# Patient Record
Sex: Male | Born: 1955 | Race: White | Hispanic: No | Marital: Married | Smoking: Former smoker
Health system: Southern US, Community
[De-identification: ages and names within clinical notes are randomized; demographics above are authoritative.]

## PROBLEM LIST (undated history)

## (undated) DIAGNOSIS — J42 Unspecified chronic bronchitis: Secondary | ICD-10-CM

## (undated) DIAGNOSIS — J189 Pneumonia, unspecified organism: Secondary | ICD-10-CM

## (undated) DIAGNOSIS — I2510 Atherosclerotic heart disease of native coronary artery without angina pectoris: Secondary | ICD-10-CM

## (undated) DIAGNOSIS — I209 Angina pectoris, unspecified: Secondary | ICD-10-CM

## (undated) DIAGNOSIS — E7800 Pure hypercholesterolemia, unspecified: Secondary | ICD-10-CM

## (undated) DIAGNOSIS — T7840XA Allergy, unspecified, initial encounter: Secondary | ICD-10-CM

## (undated) DIAGNOSIS — M545 Low back pain, unspecified: Secondary | ICD-10-CM

## (undated) DIAGNOSIS — M5136 Other intervertebral disc degeneration, lumbar region: Secondary | ICD-10-CM

## (undated) DIAGNOSIS — J45909 Unspecified asthma, uncomplicated: Secondary | ICD-10-CM

## (undated) DIAGNOSIS — M1990 Unspecified osteoarthritis, unspecified site: Secondary | ICD-10-CM

## (undated) DIAGNOSIS — G4730 Sleep apnea, unspecified: Secondary | ICD-10-CM

## (undated) DIAGNOSIS — M84376A Stress fracture, unspecified foot, initial encounter for fracture: Secondary | ICD-10-CM

## (undated) DIAGNOSIS — M810 Age-related osteoporosis without current pathological fracture: Secondary | ICD-10-CM

## (undated) DIAGNOSIS — I219 Acute myocardial infarction, unspecified: Secondary | ICD-10-CM

## (undated) DIAGNOSIS — G8929 Other chronic pain: Secondary | ICD-10-CM

## (undated) DIAGNOSIS — I10 Essential (primary) hypertension: Secondary | ICD-10-CM

## (undated) DIAGNOSIS — G43909 Migraine, unspecified, not intractable, without status migrainosus: Secondary | ICD-10-CM

## (undated) HISTORY — DX: Acute myocardial infarction, unspecified: I21.9

## (undated) HISTORY — DX: Allergy, unspecified, initial encounter: T78.40XA

## (undated) HISTORY — DX: Essential (primary) hypertension: I10

## (undated) HISTORY — PX: BILATERAL CARPAL TUNNEL RELEASE: SHX6508

## (undated) HISTORY — DX: Other intervertebral disc degeneration, lumbar region: M51.36

## (undated) HISTORY — PX: KNEE CARTILAGE SURGERY: SHX688

## (undated) HISTORY — DX: Unspecified asthma, uncomplicated: J45.909

## (undated) HISTORY — DX: Stress fracture, unspecified foot, initial encounter for fracture: M84.376A

## (undated) HISTORY — DX: Migraine, unspecified, not intractable, without status migrainosus: G43.909

## (undated) HISTORY — DX: Age-related osteoporosis without current pathological fracture: M81.0

---

## 1976-01-30 HISTORY — PX: KNEE ARTHROSCOPY: SHX127

## 2006-04-30 DIAGNOSIS — M545 Low back pain, unspecified: Secondary | ICD-10-CM | POA: Insufficient documentation

## 2006-04-30 DIAGNOSIS — M5414 Radiculopathy, thoracic region: Secondary | ICD-10-CM

## 2006-04-30 DIAGNOSIS — M5417 Radiculopathy, lumbosacral region: Secondary | ICD-10-CM

## 2006-04-30 DIAGNOSIS — J45909 Unspecified asthma, uncomplicated: Secondary | ICD-10-CM

## 2006-04-30 DIAGNOSIS — M5126 Other intervertebral disc displacement, lumbar region: Secondary | ICD-10-CM

## 2006-04-30 DIAGNOSIS — M47817 Spondylosis without myelopathy or radiculopathy, lumbosacral region: Secondary | ICD-10-CM

## 2008-08-11 DIAGNOSIS — M5412 Radiculopathy, cervical region: Secondary | ICD-10-CM

## 2008-08-11 DIAGNOSIS — M47812 Spondylosis without myelopathy or radiculopathy, cervical region: Secondary | ICD-10-CM

## 2008-08-11 DIAGNOSIS — M542 Cervicalgia: Secondary | ICD-10-CM | POA: Insufficient documentation

## 2008-08-11 DIAGNOSIS — M5020 Other cervical disc displacement, unspecified cervical region: Secondary | ICD-10-CM

## 2013-12-11 ENCOUNTER — Ambulatory Visit: Payer: Self-pay | Admitting: Family Medicine

## 2013-12-14 ENCOUNTER — Ambulatory Visit (INDEPENDENT_AMBULATORY_CARE_PROVIDER_SITE_OTHER): Payer: BC Managed Care – PPO | Admitting: Family Medicine

## 2013-12-14 ENCOUNTER — Encounter: Payer: Self-pay | Admitting: Family Medicine

## 2013-12-14 VITALS — BP 134/79 | HR 78 | Ht 71.0 in | Wt 191.0 lb

## 2013-12-14 DIAGNOSIS — M5136 Other intervertebral disc degeneration, lumbar region: Secondary | ICD-10-CM

## 2013-12-14 DIAGNOSIS — G43909 Migraine, unspecified, not intractable, without status migrainosus: Secondary | ICD-10-CM

## 2013-12-14 DIAGNOSIS — M47816 Spondylosis without myelopathy or radiculopathy, lumbar region: Secondary | ICD-10-CM | POA: Insufficient documentation

## 2013-12-14 DIAGNOSIS — G43809 Other migraine, not intractable, without status migrainosus: Secondary | ICD-10-CM

## 2013-12-14 DIAGNOSIS — M19071 Primary osteoarthritis, right ankle and foot: Secondary | ICD-10-CM | POA: Insufficient documentation

## 2013-12-14 DIAGNOSIS — J302 Other seasonal allergic rhinitis: Secondary | ICD-10-CM | POA: Diagnosis not present

## 2013-12-14 DIAGNOSIS — M84376A Stress fracture, unspecified foot, initial encounter for fracture: Secondary | ICD-10-CM

## 2013-12-14 DIAGNOSIS — M51369 Other intervertebral disc degeneration, lumbar region without mention of lumbar back pain or lower extremity pain: Secondary | ICD-10-CM

## 2013-12-14 HISTORY — DX: Migraine, unspecified, not intractable, without status migrainosus: G43.909

## 2013-12-14 HISTORY — DX: Stress fracture, unspecified foot, initial encounter for fracture: M84.376A

## 2013-12-14 HISTORY — DX: Other intervertebral disc degeneration, lumbar region: M51.36

## 2013-12-14 HISTORY — DX: Other intervertebral disc degeneration, lumbar region without mention of lumbar back pain or lower extremity pain: M51.369

## 2013-12-14 MED ORDER — SUMATRIPTAN SUCCINATE 100 MG PO TABS: 100.0000 mg | ORAL_TABLET | ORAL | Status: DC | PRN

## 2013-12-14 MED ORDER — GABAPENTIN 300 MG PO CAPS: 300.0000 mg | ORAL_CAPSULE | Freq: Every day | ORAL | Status: DC

## 2013-12-14 MED ORDER — MONTELUKAST SODIUM 10 MG PO TABS: 10.0000 mg | ORAL_TABLET | Freq: Every day | ORAL | Status: DC

## 2013-12-14 NOTE — Progress Notes (Signed)
CC: Joseph Jacobs is a 58 y.o. male is here for Establish Care   Subjective: HPI:  Very pleasant 58 year old here to establish care. Administrator at [employer]  Patient reports a history of lumbar degenerative disc disease at least 3 disks. He had an MRI done 2 years ago and since he did not have any radicular pain surgeons at [facility] did not want to intervene on him surgically. He's tried Celebrex and meloxicam the latter of the 2 most effective. He is currently not taking any else for pain. He describes it as a stiffness, pain, ache in the midline of the lumbar spine which is nonradiating. Worse with lifting heavy objects, first thing in the morning. Nothing else seems to make better or worse.  Complains of chronic right foot pain going on for almost 8 months now. He had a bone scan while at [facility] which was suggestive of a stress fracture he also had a x-ray in May of this year with nonspecific midfoot arthrosis at the second tarsometatarsal joint. Pain is described as a 8 out of 10 occasionally 10 out of 10 with weightbearing.slight improvement with meloxicam. Slight improvement with a carbon fiber orthotic. Pain is mostly localized on the dorsal aspect of the foot just proximal to the base of the second toe and radiates towards the ankle. It is slightly improved also with wearing immobilizer but he admits he does not wear it that often. Symptoms have been worsening since onset. He was planning on getting an MRI but moved before this was obtained. Denies any swelling redness or warmth overlying the site of pain  Requesting refills on Imitrex. He takes this 1-2 times a month for migraines. Symptoms were resolved within minutes provided he takes this soon after the migraine begins. He denies any new motor or sensory disturbances  Requesting refills on simulator for seasonal allergies. He is currently asymptomatic  Review of Systems - General ROS: negative for - chills, fever, night  sweats, weight gain or weight loss Ophthalmic ROS: negative for - decreased vision Psychological ROS: negative for - anxiety or depression ENT ROS: negative for - hearing change, nasal congestion, tinnitus or allergies Hematological and Lymphatic ROS: negative for - bleeding problems, bruising or swollen lymph nodes Breast ROS: negative Respiratory ROS: no cough, shortness of breath, or wheezing Cardiovascular ROS: no chest pain or dyspnea on exertion Gastrointestinal ROS: no abdominal pain, change in bowel habits, or black or bloody stools Genito-Urinary ROS: negative for - genital discharge, genital ulcers, incontinence or abnormal bleeding from genitals Musculoskeletal ROS: negative for - joint pain or muscle painother than that described above Neurological ROS: negative for - headaches or memory loss Dermatological ROS: negative for lumps, mole changes, rash and skin lesion changes  Past Medical History  Diagnosis Date  . Lumbar degenerative disc disease 12/14/2013  . Migraine 12/14/2013  . Stress fracture of foot 12/14/2013    Past Surgical History  Procedure Laterality Date  . Knee surgery    . Bilateral carpal tunnel release     Family History  Problem Relation Age of Onset  . Alcoholism Mother   . Cancer    . Diabetes Mother     father     History   Social History  . Marital Status: Married    Spouse Name: N/A    Number of Children: N/A  . Years of Education: N/A   Occupational History  . Not on file.   Social History Main Topics  . Smoking status: Current  Every Day Smoker -- 0.25 packs/day for 40 years    Types: Cigarettes  . Smokeless tobacco: Not on file  . Alcohol Use: 1.8 oz/week    3 Not specified per week  . Drug Use: No  . Sexual Activity:    Partners: Female   Other Topics Concern  . Not on file   Social History Narrative  . No narrative on file     Objective: BP 134/79 mmHg  Pulse 78  Ht 5\' 11"  (1.803 m)  Wt 191 lb (86.637 kg)  BMI  26.65 kg/m2  General: Alert and Oriented, No Acute Distress HEENT: Pupils equal, round, reactive to light. Conjunctivae clear.  Moist mucous membranes pharynxUnremarkable Lungs: clear uncomfortable work of breathing Cardiac: Regular rate and rhythm.  Extremities: No peripheral edema.  Strong peripheral pulses. Pain is reproduced in the right foot with compression of the toe box. Pain is also reproduced with translocation of the heads of the second when compressing the plantar aspect of the second metatarsal head he has temporary complete numbness of the second and third toe. No pain at the base of the fifth metatarsal nor when palpating the navicular. Full range of motion and strength throughout the right foot. No overlying skin changes or abnormalities at site of discomfort Mental Status: No depression, anxiety, nor agitation. Skin: Warm and dry.  Assessment & Plan: Joseph Jacobs was seen today for establish care.  Diagnoses and associated orders for this visit:  Other migraine without status migrainosus, not intractable - SUMAtriptan (IMITREX) 100 MG tablet; Take 1 tablet (100 mg total) by mouth every 2 (two) hours as needed for migraine or headache. May repeat in 2 hours if headache persists or recurs.  Lumbar degenerative disc disease - gabapentin (NEURONTIN) 300 MG capsule; Take 1 capsule (300 mg total) by mouth at bedtime. For back pain.  Stress fracture of foot, unspecified laterality, initial encounter  Seasonal allergies  Other Orders - montelukast (SINGULAIR) 10 MG tablet; Take 1 tablet (10 mg total) by mouth at bedtime.    Migraine: Controlled on abortive therapy, refills provided Lumbar degenerative disc disease: Start gabapentin, discussed that he should start this at night one dose before going to bed to determine if it's causing any sedation. If not causing any side effects we can get more aggressive with dosing 3 times a day and then titrating up if needed. Stress  fracture: Current working diagnosis is that he has a stress fracture in the right foot at the second tarsal metatarsal joint. Given his exam I'm suspicious that his pain could also be due to a neuroma. I have referred him to Dr. Karie Schwalbe  in sports medicine for a second opinion and further evaluation. For now where ankle immobilizer during all waking hours other than when showering. Seasonal allergies: Continue montelukast  Handicap placard application completed in the presence of the patient  40 minutes spent face-to-face during visit today of which at least 50% was counseling or coordinating care regarding: 1. Other migraine without status migrainosus, not intractable   2. Lumbar degenerative disc disease   3. Stress fracture of foot, unspecified laterality, initial encounter   4. Seasonal allergies      Return for Dr. Karie Schwalbe Sports Medicine Eval (Stress Fx of Foot?).

## 2013-12-18 ENCOUNTER — Ambulatory Visit (INDEPENDENT_AMBULATORY_CARE_PROVIDER_SITE_OTHER): Payer: BC Managed Care – PPO | Admitting: Sports Medicine

## 2013-12-18 ENCOUNTER — Ambulatory Visit (INDEPENDENT_AMBULATORY_CARE_PROVIDER_SITE_OTHER): Payer: BC Managed Care – PPO

## 2013-12-18 ENCOUNTER — Encounter: Payer: Self-pay | Admitting: Sports Medicine

## 2013-12-18 VITALS — BP 131/81 | HR 82 | Ht 71.0 in | Wt 193.0 lb

## 2013-12-18 DIAGNOSIS — M2021 Hallux rigidus, right foot: Secondary | ICD-10-CM

## 2013-12-18 DIAGNOSIS — M25674 Stiffness of right foot, not elsewhere classified: Secondary | ICD-10-CM

## 2013-12-18 DIAGNOSIS — M25571 Pain in right ankle and joints of right foot: Secondary | ICD-10-CM

## 2013-12-18 NOTE — Progress Notes (Signed)
   Subjective:    I'm seeing this patient as a consultation for:  Dr. 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 He comes in with a long history of pain that he localizes at the right first metatarsal-phalangeal joint. [place] and a bone scan. The bone scan was essentially nonspecific but he was told he may have a stress fracture. Subsequently he was seen here, and referred to me for further evaluation and definitive treatment. Pain is localized at the metatarsophalangeal joint relation to the dorsal forefoot, there is no pain over the dorsum of the metatarsal shaft. Pain is moderate, persistent, it stiff in the mornings and he does exhibit significant gelling. Oral NSAIDs have been ineffective.  Past medical history, Surgical history, Family history not pertinant except as noted below, Social history, Allergies, and medications have been entered into the medical record, reviewed, and no changes needed.   Review of Systems: No headache, visual changes, nausea, vomiting, diarrhea, constipation, dizziness, abdominal pain, skin rash, fevers, chills, night sweats, weight loss, swollen lymph nodes, body aches, joint swelling, muscle aches, chest pain, shortness of breath, mood changes, visual or auditory hallucinations.   Objective:   General: Well Developed, well nourished, and in no acute distress.  Neuro/Psych: Alert and oriented x3, extra-ocular muscles intact, able to move all 4 extremities, sensation grossly intact. Skin: Warm and dry, no rashes noted.  Respiratory: Not using accessory muscles, speaking in full sentences, trachea midline.  Cardiovascular: Pulses palpable, no extremity edema. Abdomen: Does not appear distended. Right Foot: No visible erythema or swelling. Range of motion is full in all directions. Strength is 5/5 in all directions. No hallux valgus. No  pes cavus or pes planus. No abnormal callus noted. No pain over the navicular prominence, or base of fifth metatarsal. No tenderness to palpation of the calcaneal insertion of plantar fascia. No pain at the Achilles insertion. No pain over the calcaneal bursa. No pain of the retrocalcaneal bursa. No tenderness to palpation over the tarsals, metatarsals, or phalanges. No hallux rigidus or limitus. Tender to palpation over the first metatarsophalangeal joint, there are palpable osteophytes and effusion. No pain with compression of the metatarsal heads. Neurovascularly intact distally.  Procedure: Real-time Ultrasound Guided Injection of right first metatarsal-phalangeal joint Device: GE Logiq E  Verbal informed consent obtained.  Time-out conducted.  Noted no overlying erythema, induration, or other signs of local infection.  Skin prepped in a sterile fashion.  Local anesthesia: Topical Ethyl chloride.  With sterile technique and under real time ultrasound guidance:  30-gauge needle advanced into joint, 0.5 mL kenalog 40, 0.5 mL lidocaine injected easily. Completed without difficulty  Pain immediately resolved suggesting accurate placement of the medication.  Advised to call if fevers/chills, erythema, induration, drainage, or persistent bleeding.  Images permanently stored and available for review in the ultrasound unit.  Impression: Technically successful ultrasound guided injection.  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  Impression and Recommendations:   This case required medical decision making of moderate complexity.

## 2013-12-18 NOTE — Assessment & Plan Note (Signed)
Injection. Return for custom orthotics with first metatarsal ray posting.

## 2014-01-05 ENCOUNTER — Telehealth: Payer: Self-pay

## 2014-01-05 DIAGNOSIS — G43809 Other migraine, not intractable, without status migrainosus: Secondary | ICD-10-CM

## 2014-01-05 NOTE — Telephone Encounter (Signed)
Received PA for Sumatriptan 100 mg tablets. [facility] will allow 9 tablets in a 30 day period. If he is using more than 9 tablets a month he will need a PA to approve the additional tablets. The requirements for additional tablets is as followed: Patient must have tried and failed or currently taking preventive therapy - Headaches have to be less than 12 headaches a month but more than 4. I called patient to see if he was needing a refill early, no answer, left message.    Quantity coverage form in Dr Hommel's in box.

## 2014-01-07 MED ORDER — RIZATRIPTAN BENZOATE 10 MG PO TABS: 10.0000 mg | ORAL_TABLET | ORAL | Status: DC | PRN

## 2014-01-07 NOTE — Telephone Encounter (Signed)
Patient notified. Keylee Shrestha, CMA 

## 2014-01-07 NOTE — Telephone Encounter (Signed)
Yes, I have just now sent in a Rx for rizatriptan.  Can you also inform him that I am not going to submit a PA for sumatriptan since Anglea tells me that [facility] will cover 9 tablets every 30 days w/out a PA and this would be plenty per his frequency of migraines.  Also, the PA will not be submitted since rizatriptan might work as well or better than sumatriptan.

## 2014-01-07 NOTE — Telephone Encounter (Signed)
Thayer OhmChris called and he is asking if rizatriptan can be called to his pharmacy. He feels that this med may help him better than sumatriptan or in conjunction with. Please advise. Corliss SkainsJamie Kendelle Schweers, CMA

## 2014-01-25 ENCOUNTER — Ambulatory Visit (INDEPENDENT_AMBULATORY_CARE_PROVIDER_SITE_OTHER): Payer: BC Managed Care – PPO | Admitting: Sports Medicine

## 2014-01-25 ENCOUNTER — Encounter: Payer: Self-pay | Admitting: Sports Medicine

## 2014-01-25 ENCOUNTER — Ambulatory Visit (INDEPENDENT_AMBULATORY_CARE_PROVIDER_SITE_OTHER): Payer: BC Managed Care – PPO

## 2014-01-25 VITALS — BP 142/86 | HR 85 | Wt 193.0 lb

## 2014-01-25 DIAGNOSIS — M2021 Hallux rigidus, right foot: Secondary | ICD-10-CM

## 2014-01-25 DIAGNOSIS — M19011 Primary osteoarthritis, right shoulder: Secondary | ICD-10-CM | POA: Insufficient documentation

## 2014-01-25 DIAGNOSIS — M25511 Pain in right shoulder: Secondary | ICD-10-CM | POA: Diagnosis not present

## 2014-01-25 DIAGNOSIS — G43809 Other migraine, not intractable, without status migrainosus: Secondary | ICD-10-CM | POA: Diagnosis not present

## 2014-01-25 DIAGNOSIS — M25674 Stiffness of right foot, not elsewhere classified: Secondary | ICD-10-CM | POA: Diagnosis not present

## 2014-01-25 MED ORDER — DICLOFENAC SODIUM 75 MG PO TBEC: 75.0000 mg | DELAYED_RELEASE_TABLET | Freq: Two times a day (BID) | ORAL | Status: DC

## 2014-01-25 MED ORDER — SUMATRIPTAN SUCCINATE 100 MG PO TABS: 100.0000 mg | ORAL_TABLET | ORAL | Status: DC | PRN

## 2014-01-25 NOTE — Assessment & Plan Note (Signed)
Excellent response to first MTP injection. Completely pain-free at this joint.  he does have some tenderness to palpation over the dorsum of the fourth metatarsal shaft suggestive of a stress injury.  Voltaren.  continue carbon fiber plate.  Return in one month.

## 2014-01-25 NOTE — Progress Notes (Signed)

## 2014-01-25 NOTE — Assessment & Plan Note (Signed)
Pain is referable to the right glenohumeral joint, x-rays, switching to Voltaren. Injection if no better.

## 2014-01-25 NOTE — Assessment & Plan Note (Signed)
Written prescription for sumatriptan.

## 2014-02-26 ENCOUNTER — Ambulatory Visit: Payer: Self-pay | Admitting: Sports Medicine

## 2014-03-02 ENCOUNTER — Ambulatory Visit (INDEPENDENT_AMBULATORY_CARE_PROVIDER_SITE_OTHER): Payer: BC Managed Care – PPO | Admitting: Sports Medicine

## 2014-03-02 ENCOUNTER — Encounter: Payer: Self-pay | Admitting: Sports Medicine

## 2014-03-02 DIAGNOSIS — M25511 Pain in right shoulder: Secondary | ICD-10-CM | POA: Diagnosis not present

## 2014-03-02 NOTE — Assessment & Plan Note (Signed)
Persistent pain at the anterior joint line, glenohumeral joint injection as above, formal physical therapy. Return in one month.

## 2014-03-02 NOTE — Progress Notes (Signed)
  Subjective:    CC: Follow-up  HPI: Foot pain: Doing well with orthotics and post first MTP injection sometime ago.  Right shoulder pain: Moderate, persistent, localized anteriorly at the joint line and worse with abduction and external rotation. He has done some rehabilitation exercises and we switched him to Voltaren at the last visit, this is helped somewhat but he continues to have severe pain with abduction. He is amenable to try interventional treatment today.  Past medical history, Surgical history, Family history not pertinant except as noted below, Social history, Allergies, and medications have been entered into the medical record, reviewed, and no changes needed.   Review of Systems: No fevers, chills, night sweats, weight loss, chest pain, or shortness of breath.   Objective:    General: Well Developed, well nourished, and in no acute distress.  Neuro: Alert and oriented x3, extra-ocular muscles intact, sensation grossly intact.  HEENT: Normocephalic, atraumatic, pupils equal round reactive to light, neck supple, no masses, no lymphadenopathy, thyroid nonpalpable.  Skin: Warm and dry, no rashes. Cardiac: Regular rate and rhythm, no murmurs rubs or gallops, no lower extremity edema.  Respiratory: Clear to auscultation bilaterally. Not using accessory muscles, speaking in full sentences. Right Shoulder: Inspection reveals no abnormalities, atrophy or asymmetry. Palpation is normal with no tenderness over AC joint or bicipital groove. ROM is full in all planes. Rotator cuff strength normal throughout. No signs of impingement with negative Neer and Hawkin's tests, empty can. Speeds and Yergason's tests normal. Positive crank test, with tenderness to palpation at the anterior joint line. Normal scapular function observed. No painful arc and no drop arm sign. No apprehension sign  Procedure: Real-time Ultrasound Guided Injection of right glenohumeral joint Device: GE Logiq E    Verbal informed consent obtained.  Time-out conducted.  Noted no overlying erythema, induration, or other signs of local infection.  Skin prepped in a sterile fashion.  Local anesthesia: Topical Ethyl chloride.  With sterile technique and under real time ultrasound guidance:  Using a spinal needle, and taking care to avoid the labrum I injected 1 mL kenalog 40, 4 mL lidocaine into the glenohumeral joint, this resulted in complete relief of his pain. Completed without difficulty  Pain immediately resolved suggesting accurate placement of the medication.  Advised to call if fevers/chills, erythema, induration, drainage, or persistent bleeding.  Images permanently stored and available for review in the ultrasound unit.  Impression: Technically successful ultrasound guided injection.  Impression and Recommendations:

## 2014-03-10 ENCOUNTER — Ambulatory Visit (INDEPENDENT_AMBULATORY_CARE_PROVIDER_SITE_OTHER): Payer: BC Managed Care – PPO | Admitting: Physical Therapy

## 2014-03-10 DIAGNOSIS — M25519 Pain in unspecified shoulder: Secondary | ICD-10-CM

## 2014-03-10 DIAGNOSIS — M6281 Muscle weakness (generalized): Secondary | ICD-10-CM

## 2014-03-10 DIAGNOSIS — M25511 Pain in right shoulder: Secondary | ICD-10-CM

## 2014-03-18 ENCOUNTER — Encounter: Payer: Self-pay | Admitting: Physical Therapy

## 2014-03-18 ENCOUNTER — Ambulatory Visit (INDEPENDENT_AMBULATORY_CARE_PROVIDER_SITE_OTHER): Payer: BC Managed Care – PPO | Admitting: Physical Therapy

## 2014-03-18 DIAGNOSIS — R29898 Other symptoms and signs involving the musculoskeletal system: Secondary | ICD-10-CM

## 2014-03-18 DIAGNOSIS — M25511 Pain in right shoulder: Secondary | ICD-10-CM

## 2014-03-18 NOTE — Patient Instructions (Addendum)
Strengthening: Resisted Horizontal Abduction   Hold tubing in right hand, elbow straight, arm in, parallel to floor. Pull arm out from side through pain-free range. Repeat _10___ times per set. Do ___3_ sets per session. Do ___1_ sessions per day.  http://orth.exer.us/836   Copyright  VHI. All rights reserved.  Low Row: Single Arm   Face anchor in stride stance. Hold with both handsPalm up, squeeze shoulder blades together. Repeat _10_ times per set. Repeat with other arm. Do _3_ sets per session. Do _1_ sessions per day. Anchor Height: Waist  http://tub.exer.us/71   Copyright  VHI. All rights reserved.

## 2014-03-18 NOTE — Therapy (Signed)
[facility] [address] [place], [place], [address] Phone: [phone]   Fax:  [phone]  Physical Therapy Treatment  Patient Details  Name: Joseph Jacobs MRN: [id number] Date of Birth: 12/24/1955 Referring Provider:  Monica Bectonhekkekandam, Thomas J,*  Encounter Date: 03/18/2014      PT End of Session - 03/18/14 1517    Visit Number 2   Number of Visits 6   Date for PT Re-Evaluation 04/08/14   PT Start Time 1450   PT Stop Time 1546   PT Time Calculation (min) 56 min      Past Medical History  Diagnosis Date  . Lumbar degenerative disc disease 12/14/2013  . Migraine 12/14/2013  . Stress fracture of foot 12/14/2013    Past Surgical History  Procedure Laterality Date  . Knee surgery    . Bilateral carpal tunnel release      There were no vitals taken for this visit.  Visit Diagnosis:  Weakness of right arm  Pain in joint, shoulder region, right      Subjective Assessment - 03/18/14 1453    Symptoms doing better, no pain since his injection, only feels muscles working when he is exercise.    Currently in Pain? No/denies                    [facility] Adult PT Treatment/Exercise - 03/18/14 0001    Exercises   Exercises Shoulder   Shoulder Exercises: Prone   Retraction --  lawn mover 3x10 with 3#   Shoulder Exercises: Sidelying   External Rotation --  3x8 with 3# and towel under arm   Other Sidelying Exercises empty can with 2#, 2x8 in limited ROM   Shoulder Exercises: Standing   Theraband Level (Shoulder Protraction) Level 3 (Green)  3x10   Theraband Level (Shoulder ABduction) Level 3 (Green)  3x10 horizontal abduction with focus on scap retraction   Theraband Level (Shoulder Extension) Level 3 (Green)  3x10   Shoulder Exercises: ROM/Strengthening   UBE (Upper Arm Bike) L2 x 2'/2'   Modalities   Modalities Iontophoresis   Iontophoresis   Type of Iontophoresis Dexamethasone   Location  anterior Rt shoulder   Dose 1.0cc   Time 6 hr patch                PT Education - 03/18/14 1513    Education provided Yes   Education Details HEP   Person(s) Educated Patient   Methods Explanation;Demonstration;Handout   Comprehension Returned demonstration                    Plan - 03/18/14 1532    Clinical Impression Statement Patient tolerating HEP well, pain continues to decrease, he will try golf this weekend if weather allows.  Still has some weakness and fatigues quickly with shoudler exercise   Pt will benefit from skilled therapeutic intervention in order to improve on the following deficits Decreased activity tolerance;Impaired UE functional use;Decreased strength   Rehab Potential Excellent   PT Frequency 1x / week   PT Duration 6 weeks   PT Treatment/Interventions Moist Heat;Therapeutic activities;Patient/family education;Therapeutic exercise;Ultrasound;Manual techniques;Cryotherapy;Electrical Stimulation   PT Next Visit Plan progress HEP   Consulted and Agree with Plan of Care Patient        Problem List Patient Active Problem List   Diagnosis Date Noted  . Right shoulder pain 01/25/2014  . Migraine 12/14/2013  . Lumbar degenerative disc disease 12/14/2013  . 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 Seasonal allergies 12/14/2013    Roderic Scarce, PT 03/18/2014, 3:40 PM  [facility] [address] [place], [place], [address] Phone: [phone]   Fax:  [phone]

## 2014-03-25 ENCOUNTER — Encounter: Payer: Self-pay | Admitting: Physical Therapy

## 2014-03-30 ENCOUNTER — Encounter: Payer: Self-pay | Admitting: Sports Medicine

## 2014-03-30 ENCOUNTER — Ambulatory Visit (INDEPENDENT_AMBULATORY_CARE_PROVIDER_SITE_OTHER): Payer: BC Managed Care – PPO | Admitting: Sports Medicine

## 2014-03-30 VITALS — BP 122/82 | HR 81 | Wt 193.0 lb

## 2014-03-30 DIAGNOSIS — M19011 Primary osteoarthritis, right shoulder: Secondary | ICD-10-CM | POA: Diagnosis not present

## 2014-03-30 NOTE — Progress Notes (Signed)
  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  Past medical history, Surgical history, Family history not pertinant except as noted below, Social history, Allergies, and medications have been entered into the medical record, reviewed, and no changes needed.   Review of Systems: No fevers, chills, night sweats, weight loss, chest pain, or shortness of breath.   Objective:    General: Well Developed, well nourished, and in no acute distress.  Neuro: Alert and oriented x3, extra-ocular muscles intact, sensation grossly intact.  HEENT: Normocephalic, atraumatic, pupils equal round reactive to light, neck supple, no masses, no lymphadenopathy, thyroid nonpalpable.  Skin: Warm and dry, no rashes. Cardiac: Regular rate and rhythm, no murmurs rubs or gallops, no lower extremity edema.  Respiratory: Clear to auscultation bilaterally. Not using accessory muscles, speaking in full sentences.  Impression and Recommendations:

## 2014-03-30 NOTE — Assessment & Plan Note (Signed)
Completely resolved after glenohumeral injection. Next line return as needed.

## 2014-05-26 ENCOUNTER — Other Ambulatory Visit: Payer: Self-pay | Admitting: Sports Medicine

## 2014-09-24 ENCOUNTER — Other Ambulatory Visit: Payer: Self-pay | Admitting: Sports Medicine

## 2014-10-15 ENCOUNTER — Telehealth: Payer: Self-pay | Admitting: *Deleted

## 2014-10-15 MED ORDER — ALBUTEROL SULFATE (2.5 MG/3ML) 0.083% IN NEBU: 2.5000 mg | INHALATION_SOLUTION | Freq: Four times a day (QID) | RESPIRATORY_TRACT | Status: DC | PRN

## 2014-10-15 NOTE — Telephone Encounter (Addendum)
Pt requested albuterol for nebulizer machine. I have to route since we have not rx'ed this medication yet

## 2014-10-15 NOTE — Telephone Encounter (Signed)
Rx sent to [facility] on [address]

## 2014-12-10 ENCOUNTER — Encounter: Payer: Self-pay | Admitting: Family Medicine

## 2014-12-10 ENCOUNTER — Ambulatory Visit (INDEPENDENT_AMBULATORY_CARE_PROVIDER_SITE_OTHER): Payer: BC Managed Care – PPO | Admitting: Family Medicine

## 2014-12-10 VITALS — BP 123/86 | HR 87 | Wt 200.0 lb

## 2014-12-10 DIAGNOSIS — J302 Other seasonal allergic rhinitis: Secondary | ICD-10-CM | POA: Diagnosis not present

## 2014-12-10 DIAGNOSIS — G43809 Other migraine, not intractable, without status migrainosus: Secondary | ICD-10-CM | POA: Diagnosis not present

## 2014-12-10 DIAGNOSIS — R413 Other amnesia: Secondary | ICD-10-CM | POA: Diagnosis not present

## 2014-12-10 MED ORDER — SUMATRIPTAN SUCCINATE 100 MG PO TABS: 100.0000 mg | ORAL_TABLET | ORAL | Status: DC | PRN

## 2014-12-10 MED ORDER — ALBUTEROL SULFATE HFA 108 (90 BASE) MCG/ACT IN AERS: INHALATION_SPRAY | RESPIRATORY_TRACT | Status: DC
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
## 2014-12-11 LAB — TSH: TSH: 1.258 u[IU]/mL (ref 0.350–4.500)

## 2014-12-11 LAB — IRON AND TIBC
%SAT: 33 % (ref 15–60)
IRON: 114 ug/dL (ref 50–180)
TIBC: 346 ug/dL (ref 250–425)
UIBC: 232 ug/dL (ref 125–400)

## 2014-12-11 LAB — VITAMIN B12: Vitamin B-12: 250 pg/mL (ref 211–911)

## 2014-12-11 LAB — VITAMIN D 25 HYDROXY (VIT D DEFICIENCY, FRACTURES): VIT D 25 HYDROXY: 29 ng/mL — AB (ref 30–100)

## 2014-12-13 ENCOUNTER — Telehealth: Payer: Self-pay | Admitting: Family Medicine

## 2014-12-13 ENCOUNTER — Ambulatory Visit (INDEPENDENT_AMBULATORY_CARE_PROVIDER_SITE_OTHER): Payer: BC Managed Care – PPO | Admitting: Sports Medicine

## 2014-12-13 ENCOUNTER — Encounter: Payer: Self-pay | Admitting: Sports Medicine

## 2014-12-13 ENCOUNTER — Encounter: Payer: Self-pay | Admitting: Family Medicine

## 2014-12-13 VITALS — BP 131/70 | HR 91

## 2014-12-13 DIAGNOSIS — M19071 Primary osteoarthritis, right ankle and foot: Secondary | ICD-10-CM | POA: Diagnosis not present

## 2014-12-13 DIAGNOSIS — G4730 Sleep apnea, unspecified: Secondary | ICD-10-CM

## 2014-12-13 DIAGNOSIS — E559 Vitamin D deficiency, unspecified: Secondary | ICD-10-CM

## 2014-12-13 DIAGNOSIS — M51369 Other intervertebral disc degeneration, lumbar region without mention of lumbar back pain or lower extremity pain: Secondary | ICD-10-CM

## 2014-12-13 DIAGNOSIS — R413 Other amnesia: Secondary | ICD-10-CM | POA: Insufficient documentation

## 2014-12-13 DIAGNOSIS — M5136 Other intervertebral disc degeneration, lumbar region: Secondary | ICD-10-CM

## 2014-12-13 MED ORDER — VITAMIN D (ERGOCALCIFEROL) 1.25 MG (50000 UNIT) PO CAPS: 50000.0000 [IU] | ORAL_CAPSULE | ORAL | Status: DC

## 2014-12-13 MED ORDER — TRAMADOL HCL 50 MG PO TABS: ORAL_TABLET | ORAL | Status: DC

## 2014-12-13 NOTE — Telephone Encounter (Signed)
Will you please let patient know that his blood work showed a mild Vitamin D deficiency but no abnormality to account for the severity of his memory loss.  I'd recommend starting a weekly vitamin d supplement to help address his deficiency, Rx sent to [facility]. Also I'd recommend a home sleep test, please call me if not notified about arranging this by next week.

## 2014-12-13 NOTE — Progress Notes (Addendum)
  Subjective:    CC: Back and foot pain  HPI: This is a pleasant 59 year old male, I have treated him for right foot osteoarthritis, we injected his first metatarsophalangeal joint and he had a fantastic response last year, he now has an increase in pain that he localizes over the dorsal midfoot, moderate, persistent, worse in the morning and with gelling.  Back pain: History of lumbar degenerative disc disease with back pain on sitting, flexion, Valsalva without any radicular symptoms, bowel or bladder dysfunction, saddle numbness, or constitutional symptoms. He has had caudal epidurals in the past with 3 to four-month responses. Most recent MRI was 2-3 years ago.  Past medical history, Surgical history, Family history not pertinant except as noted below, Social history, Allergies, and medications have been entered into the medical record, reviewed, and no changes needed.   Review of Systems: No fevers, chills, night sweats, weight loss, chest pain, or shortness of breath.   Objective:    General: Well Developed, well nourished, and in no acute distress.  Neuro: Alert and oriented x3, extra-ocular muscles intact, sensation grossly intact.  HEENT: Normocephalic, atraumatic, pupils equal round reactive to light, neck supple, no masses, no lymphadenopathy, thyroid nonpalpable.  Skin: Warm and dry, no rashes. Cardiac: Regular rate and rhythm, no murmurs rubs or gallops, no lower extremity edema.  Respiratory: Clear to auscultation bilaterally. Not using accessory muscles, speaking in full sentences. Right Foot: No visible erythema or swelling. Range of motion is full in all directions. Strength is 5/5 in all directions. No hallux valgus. No pes cavus or pes planus. No abnormal callus noted. No pain over the navicular prominence, or base of fifth metatarsal. No tenderness to palpation of the calcaneal insertion of plantar fascia. No pain at the Achilles insertion. No pain over the  calcaneal bursa. No pain of the retrocalcaneal bursa. No tenderness to palpation over the tarsals, metatarsals, or phalanges. No hallux rigidus or limitus. Tender to palpation at the naviculocuneiform joint. No pain with compression of the metatarsal heads. Neurovascularly intact distally.  Procedure: Real-time Ultrasound Guided Injection of right naviculocuneiform joint Device: GE Logiq E  Verbal informed consent obtained.  Time-out conducted.  Noted no overlying erythema, induration, or other signs of local infection.  Skin prepped in a sterile fashion.  Local anesthesia: Topical Ethyl chloride.  With sterile technique and under real time ultrasound guidance:  Noted significant overlying degenerative changes on ultrasound, needle advanced into the navicular/lateral cuneiform joint and 0.5 mL kenalog 40, 0.5 mL lidocaine injected easily. Completed without difficulty  Pain immediately resolved suggesting accurate placement of the medication.  Advised to call if fevers/chills, erythema, induration, drainage, or persistent bleeding.  Images permanently stored and available for review in the ultrasound unit.  Impression: Technically successful ultrasound guided injection.  I personally reviewed Joseph Jacobs's MRI, from July 2013, he has severe L2-L3, L4-L5, and L5-S1 degenerative disc disease with bilateral foraminal components, the L2-L3 disc protrusion is very broad based with superior and inferior endplate edema. There is also bilateral L5-S1 facet arthritis and left L3-L4 facet arthritis. Degenerative changes are widespread and lesser at other levels.  Impression and Recommendations:

## 2014-12-13 NOTE — Telephone Encounter (Signed)
Pt advised.

## 2014-12-13 NOTE — Assessment & Plan Note (Signed)
Good response to her previous right first MTP injection a year ago, now having dorsal midfoot pain, does have significant midfoot osteoarthritis and I placed an injection into the naviculocuneiform joint today. Increase diclofenac to twice a day and adding tramadol for breakthrough pain.

## 2014-12-13 NOTE — Assessment & Plan Note (Addendum)
MRI [place] [phone] years old, [place], he will bring the disc for my review and we will set him up for an epidural, pain is axial and discogenic.  MRI results are dictated above, we are going to start with a L4-L5 interlaminar epidural. Return to see me one month after injection.

## 2014-12-14 NOTE — Addendum Note (Signed)
Addended by: Monica BectonHEKKEKANDAM, THOMAS J on: 12/14/2014 12:10 PM   Modules accepted: Orders

## 2014-12-27 ENCOUNTER — Telehealth: Payer: Self-pay

## 2014-12-27 DIAGNOSIS — G478 Other sleep disorders: Secondary | ICD-10-CM

## 2014-12-27 DIAGNOSIS — R413 Other amnesia: Secondary | ICD-10-CM

## 2014-12-27 NOTE — Telephone Encounter (Signed)
In home sleep study was denied but he did get approved for in lab sleep study.

## 2014-12-28 NOTE — Telephone Encounter (Signed)
Order changed.

## 2014-12-29 NOTE — Telephone Encounter (Signed)
Notified. 

## 2015-01-20 ENCOUNTER — Ambulatory Visit
Admission: RE | Admit: 2015-01-20 | Discharge: 2015-01-20 | Disposition: A | Discharge status: Home / Self Care | Payer: BC Managed Care – PPO | Source: Ambulatory Visit | Attending: Sports Medicine | Admitting: Sports Medicine

## 2015-01-20 MED ORDER — IOHEXOL 180 MG/ML  SOLN
1.0000 mL | Freq: Once | INTRAMUSCULAR | Status: AC | PRN
  Administered 2015-01-20: 1 mL via EPIDURAL

## 2015-01-20 MED ORDER — METHYLPREDNISOLONE ACETATE 40 MG/ML INJ SUSP (RADIOLOG
120.0000 mg | Freq: Once | INTRAMUSCULAR | Status: AC
  Administered 2015-01-20: 120 mg via EPIDURAL

## 2015-01-20 NOTE — Discharge Instructions (Signed)

## 2015-01-30 LAB — HM COLONOSCOPY

## 2015-02-07 ENCOUNTER — Other Ambulatory Visit: Payer: Self-pay

## 2015-02-07 DIAGNOSIS — G43809 Other migraine, not intractable, without status migrainosus: Secondary | ICD-10-CM

## 2015-02-07 MED ORDER — SUMATRIPTAN SUCCINATE 100 MG PO TABS: 100.0000 mg | ORAL_TABLET | ORAL | Status: DC | PRN

## 2015-02-14 ENCOUNTER — Other Ambulatory Visit: Payer: Self-pay

## 2015-02-14 DIAGNOSIS — G43809 Other migraine, not intractable, without status migrainosus: Secondary | ICD-10-CM

## 2015-02-14 MED ORDER — SUMATRIPTAN SUCCINATE 100 MG PO TABS: 100.0000 mg | ORAL_TABLET | ORAL | Status: DC | PRN

## 2015-02-23 ENCOUNTER — Other Ambulatory Visit: Payer: Self-pay | Admitting: Sports Medicine

## 2015-02-23 NOTE — Telephone Encounter (Signed)
Routing to PCP

## 2015-02-28 ENCOUNTER — Other Ambulatory Visit: Payer: Self-pay

## 2015-02-28 MED ORDER — SUMATRIPTAN SUCCINATE 100 MG PO TABS: ORAL_TABLET | ORAL | Status: DC

## 2015-03-21 ENCOUNTER — Other Ambulatory Visit: Payer: Self-pay

## 2015-03-21 MED ORDER — MONTELUKAST SODIUM 10 MG PO TABS: 10.0000 mg | ORAL_TABLET | Freq: Every day | ORAL | Status: DC

## 2015-04-11 ENCOUNTER — Ambulatory Visit (INDEPENDENT_AMBULATORY_CARE_PROVIDER_SITE_OTHER): Payer: BC Managed Care – PPO

## 2015-04-11 ENCOUNTER — Ambulatory Visit (INDEPENDENT_AMBULATORY_CARE_PROVIDER_SITE_OTHER): Payer: BC Managed Care – PPO | Admitting: Sports Medicine

## 2015-04-11 VITALS — BP 128/83 | HR 84 | Resp 18 | Wt 205.0 lb

## 2015-04-11 DIAGNOSIS — M19071 Primary osteoarthritis, right ankle and foot: Secondary | ICD-10-CM | POA: Diagnosis not present

## 2015-04-11 DIAGNOSIS — M4806 Spinal stenosis, lumbar region: Secondary | ICD-10-CM

## 2015-04-11 DIAGNOSIS — M51369 Other intervertebral disc degeneration, lumbar region without mention of lumbar back pain or lower extremity pain: Secondary | ICD-10-CM

## 2015-04-11 DIAGNOSIS — M47896 Other spondylosis, lumbar region: Secondary | ICD-10-CM

## 2015-04-11 DIAGNOSIS — M25871 Other specified joint disorders, right ankle and foot: Secondary | ICD-10-CM

## 2015-04-11 DIAGNOSIS — M5136 Other intervertebral disc degeneration, lumbar region: Secondary | ICD-10-CM | POA: Diagnosis not present

## 2015-04-11 DIAGNOSIS — M84376A Stress fracture, unspecified foot, initial encounter for fracture: Secondary | ICD-10-CM | POA: Insufficient documentation

## 2015-04-11 DIAGNOSIS — M84374A Stress fracture, right foot, initial encounter for fracture: Secondary | ICD-10-CM | POA: Diagnosis not present

## 2015-04-11 NOTE — Progress Notes (Signed)
  Subjective:    CC: Multiple issues  HPI: Right great toe pain: Known osteoarthritis, previous injection in this location was over 6 months ago. Desires repeat injection.  Midfoot pain: Localized over the first, second, third metatarsal shafts, has been wearing a boot for several months now. Previous x-rays have been negative. Pain is persistent.  Lumbar radiculopathy: Good response to a left L4-L5 intralaminar epidural, he does desire a repeat epidural, the previous one was 4 months ago, he is agreeable to try a transforaminal approach this time.  Past medical history, Surgical history, Family history not pertinant except as noted below, Social history, Allergies, and medications have been entered into the medical record, reviewed, and no changes needed.   Review of Systems: No fevers, chills, night sweats, weight loss, chest pain, or shortness of breath.   Objective:    General: Well Developed, well nourished, and in no acute distress.  Neuro: Alert and oriented x3, extra-ocular muscles intact, sensation grossly intact.  HEENT: Normocephalic, atraumatic, pupils equal round reactive to light, neck supple, no masses, no lymphadenopathy, thyroid nonpalpable.  Skin: Warm and dry, no rashes. Cardiac: Regular rate and rhythm, no murmurs rubs or gallops, no lower extremity edema.  Respiratory: Clear to auscultation bilaterally. Not using accessory muscles, speaking in full sentences. Right Foot: No visible erythema or swelling. Range of motion is full in all directions. Strength is 5/5 in all directions. No hallux valgus. No pes cavus or pes planus. No abnormal callus noted. No pain over the navicular prominence, or base of fifth metatarsal. No tenderness to palpation of the calcaneal insertion of plantar fascia. No pain at the Achilles insertion. No pain over the calcaneal bursa. No pain of the retrocalcaneal bursa. Tender to palpation at the first MTP as well as over the first,  second, and third metatarsal shafts No hallux rigidus or limitus. No tenderness palpation over interphalangeal joints. No pain with compression of the metatarsal heads. Neurovascularly intact distally.  Procedure: Real-time Ultrasound Guided Injection of right first MTP Device: GE Logiq E  Verbal informed consent obtained.  Time-out conducted.  Noted no overlying erythema, induration, or other signs of local infection.  Skin prepped in a sterile fashion.  Local anesthesia: Topical Ethyl chloride.  With sterile technique and under real time ultrasound guidance:  30-gauge needle advanced into the MTP and 0.5 mL kenalog 40, 0.5 mL lidocaine injected easily. Completed without difficulty  Pain immediately resolved suggesting accurate placement of the medication.  Advised to call if fevers/chills, erythema, induration, drainage, or persistent bleeding.  Images permanently stored and available for review in the ultrasound unit.  Impression: Technically successful ultrasound guided injection.  Impression and Recommendations:

## 2015-04-11 NOTE — Assessment & Plan Note (Addendum)
Getting a new MRI of the lumbar spine, this will be performed today as well Repeat left-sided L4-L5 epidural, this time transforaminal

## 2015-04-11 NOTE — Assessment & Plan Note (Signed)
Pain for over a year, has been in a boot for 4 months intermittently.  x-rays were negative.  at this point we are going to obtain an MRI of the foot, pain is at the first, second, and third metatarsal shafts

## 2015-04-11 NOTE — Assessment & Plan Note (Signed)
Injection in the first metatarsophalangeal joint.  Previous injection was greater than 6 months ago

## 2015-04-15 ENCOUNTER — Ambulatory Visit (INDEPENDENT_AMBULATORY_CARE_PROVIDER_SITE_OTHER): Payer: BC Managed Care – PPO | Admitting: Sports Medicine

## 2015-04-15 VITALS — BP 124/85 | HR 82 | Resp 18 | Wt 204.1 lb

## 2015-04-15 DIAGNOSIS — M51369 Other intervertebral disc degeneration, lumbar region without mention of lumbar back pain or lower extremity pain: Secondary | ICD-10-CM

## 2015-04-15 DIAGNOSIS — M19071 Primary osteoarthritis, right ankle and foot: Secondary | ICD-10-CM | POA: Diagnosis not present

## 2015-04-15 DIAGNOSIS — M5136 Other intervertebral disc degeneration, lumbar region: Secondary | ICD-10-CM | POA: Diagnosis not present

## 2015-04-15 NOTE — Assessment & Plan Note (Signed)
Awaiting epidural.

## 2015-04-15 NOTE — Assessment & Plan Note (Signed)
MRI confirms multiple locations midfoot and forefoot osteoarthritis without stress injury. Overall doing well after injection, 70% better after 4 days.

## 2015-04-15 NOTE — Progress Notes (Signed)
  Subjective:    CC: follow-up MRI  HPI: Foot osteoarthritis: MRI confirms arthritis at multiple locations in the foot, he is 70% better after his injection 4 days ago.  Lumbar degenerative disc disease: We also obtained a new lumbar spine MRI that shows similar but slightly worse findings, he is still awaiting his lumbar epidural.  Past medical history, Surgical history, Family history not pertinant except as noted below, Social history, Allergies, and medications have been entered into the medical record, reviewed, and no changes needed.   Review of Systems: No fevers, chills, night sweats, weight loss, chest pain, or shortness of breath.   Objective:    General: Well Developed, well nourished, and in no acute distress.  Neuro: Alert and oriented x3, extra-ocular muscles intact, sensation grossly intact.  HEENT: Normocephalic, atraumatic, pupils equal round reactive to light, neck supple, no masses, no lymphadenopathy, thyroid nonpalpable.  Skin: Warm and dry, no rashes. Cardiac: Regular rate and rhythm, no murmurs rubs or gallops, no lower extremity edema.  Respiratory: Clear to auscultation bilaterally. Not using accessory muscles, speaking in full sentences.  Impression and Recommendations:   I spent 25 minutes with this patient, greater than 50% was face-to-face time counseling regarding the above diagnoses, this time was spent reviewing imaging studies explaining anatomy and pathophysiology of osteoarthritis.

## 2015-05-30 ENCOUNTER — Ambulatory Visit (INDEPENDENT_AMBULATORY_CARE_PROVIDER_SITE_OTHER): Payer: BC Managed Care – PPO | Admitting: Sports Medicine

## 2015-05-30 ENCOUNTER — Encounter: Payer: Self-pay | Admitting: Sports Medicine

## 2015-05-30 ENCOUNTER — Ambulatory Visit (INDEPENDENT_AMBULATORY_CARE_PROVIDER_SITE_OTHER): Payer: BC Managed Care – PPO

## 2015-05-30 VITALS — BP 120/76 | HR 77 | Resp 16 | Wt 201.8 lb

## 2015-05-30 DIAGNOSIS — R1030 Lower abdominal pain, unspecified: Secondary | ICD-10-CM | POA: Diagnosis not present

## 2015-05-30 DIAGNOSIS — M1611 Unilateral primary osteoarthritis, right hip: Secondary | ICD-10-CM

## 2015-05-30 NOTE — Assessment & Plan Note (Signed)
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 Severe hip pain was injected today. X-rays, return to see me in one month.

## 2015-05-30 NOTE — Patient Instructions (Signed)
[facility] [phone]

## 2015-05-30 NOTE — Progress Notes (Signed)
   Subjective:    I'm seeing this patient as a consultation for:  Dr. Laren BoomSean Hommel  CC: right hip pain  HPI: For weeks this pleasant 60 year old male has had severe right groin pain, worse with golfing, bearing weight, in flexion of the hip. Symptoms are severe without radiation, with occasional catching. No trauma. Desires interventional treatment today. Past medical history, Surgical history, Family history not pertinant except as noted below, Social history, Allergies, and medications have been entered into the medical record, reviewed, and no changes needed.   Review of Systems: No headache, visual changes, nausea, vomiting, diarrhea, constipation, dizziness, abdominal pain, skin rash, fevers, chills, night sweats, weight loss, swollen lymph nodes, body aches, joint swelling, muscle aches, chest pain, shortness of breath, mood changes, visual or auditory hallucinations.   Objective:   General: Well Developed, well nourished, and in no acute distress.  Neuro/Psych: Alert and oriented x3, extra-ocular muscles intact, able to move all 4 extremities, sensation grossly intact. Skin: Warm and dry, no rashes noted.  Respiratory: Not using accessory muscles, speaking in full sentences, trachea midline.  Cardiovascular: Pulses palpable, no extremity edema. Abdomen: Does not appear distended. Right Hip: ROM IR: 60 Deg, ER: 60 Deg, Flexion: 120 Deg, Extension: 100 Deg, Abduction: 45 Deg, Adduction: 45 Deg, pain with internal rotation Strength IR: 5/5, ER: 5/5, Flexion: 5/5, Extension: 5/5, Abduction: 5/5, Adduction: 5/5 Pelvic alignment unremarkable to inspection and palpation. Standing hip rotation and gait without trendelenburg / unsteadiness. Greater trochanter without tenderness to palpation. No tenderness over piriformis. No SI joint tenderness and normal minimal SI movement. Positive FADIR test.  Procedure: Real-time Ultrasound Guided Injection of right femoroacetabular joint Device:  GE Logiq E  Verbal informed consent obtained.  Time-out conducted.  Noted no overlying erythema, induration, or other signs of local infection.  Skin prepped in a sterile fashion.  Local anesthesia: Topical Ethyl chloride.  With sterile technique and under real time ultrasound guidance:  Spinal needle advanced to the femoral head/neck junction, 1 mL kenalog 40, 2 mL lidocaine, 2 mL Marcaine injected easily. Completed without difficulty  Pain immediately resolved suggesting accurate placement of the medication.  Advised to call if fevers/chills, erythema, induration, drainage, or persistent bleeding.  Images permanently stored and available for review in the ultrasound unit.  Impression: Technically successful ultrasound guided injection.  Impression and Recommendations:   This case required medical decision making of moderate complexity.

## 2015-06-09 ENCOUNTER — Other Ambulatory Visit: Payer: Self-pay

## 2015-06-23 ENCOUNTER — Ambulatory Visit
Admission: RE | Admit: 2015-06-23 | Discharge: 2015-06-23 | Disposition: A | Discharge status: Home / Self Care | Payer: BC Managed Care – PPO | Source: Ambulatory Visit | Attending: Sports Medicine | Admitting: Sports Medicine

## 2015-06-23 DIAGNOSIS — M5136 Other intervertebral disc degeneration, lumbar region: Secondary | ICD-10-CM

## 2015-06-23 MED ORDER — IOPAMIDOL (ISOVUE-M 200) INJECTION 41%
1.0000 mL | Freq: Once | INTRAMUSCULAR | Status: AC
Start: 2015-06-23 — End: 2015-06-23
  Administered 2015-06-23: 1 mL via EPIDURAL

## 2015-06-23 MED ORDER — METHYLPREDNISOLONE ACETATE 40 MG/ML INJ SUSP (RADIOLOG
120.0000 mg | Freq: Once | INTRAMUSCULAR | Status: AC
Start: 2015-06-23 — End: 2015-06-23
  Administered 2015-06-23: 120 mg via EPIDURAL

## 2015-06-23 NOTE — Discharge Instructions (Signed)

## 2015-07-03 ENCOUNTER — Emergency Department
Admission: EM | Admit: 2015-07-03 | Discharge: 2015-07-03 | Disposition: A | Discharge status: Home / Self Care | Payer: BC Managed Care – PPO | Source: Home / Self Care | Attending: Family Medicine | Admitting: Family Medicine

## 2015-07-03 ENCOUNTER — Encounter: Payer: Self-pay | Admitting: Emergency Medicine

## 2015-07-03 ENCOUNTER — Emergency Department (INDEPENDENT_AMBULATORY_CARE_PROVIDER_SITE_OTHER): Payer: BC Managed Care – PPO

## 2015-07-03 DIAGNOSIS — R208 Other disturbances of skin sensation: Secondary | ICD-10-CM

## 2015-07-03 DIAGNOSIS — R0789 Other chest pain: Secondary | ICD-10-CM

## 2015-07-03 DIAGNOSIS — R200 Anesthesia of skin: Secondary | ICD-10-CM

## 2015-07-03 NOTE — ED Provider Notes (Signed)
CSN: [id number]     Arrival date & time 07/03/15  1320 History   First MD Initiated Contact with Patient 07/03/15 1334     Chief Complaint  Patient presents with  . Chest Pain   (Consider location/radiation/quality/duration/timing/severity/associated sxs/prior Treatment) HPI The pt is a 60yo male directed to [facility] by PCP on-call nurse due to c/o intermittent chest tightness for 3 days with associated lightheadedness and Left arm numbness this morning.  Chest tightness described as 10-20 seconds of pressure-like ache around front of his chest, mild to moderate in severity. Associated mild SOB felt "at the end of my breath" but not all the time.  Hx of asthma but states he doesn't feel as if he needs to use his inhaler. Symptoms come and go several times throughout the day, but he notes he went golfing yesterday with no problems.  Today, while at church he developed the chest tightness and Left arm numbness. He believes he may have slept on Left arm incorrectly. Denies neck or back pain. Denies having symptoms at this time. Denies recent illness of cough, congestion or fever. No diaphoresis or nausea. He does report being under a lot of stress recently as he has a high stress job but also just sold his current house and bought a new house within a 4 day time frame. Denies hx of panic or anxiety attacks.  Denies hx of HTN or high cholesterol. His mother had heart issues but not until her 20s.  No other cardiac hx. Denies leg pain or swelling. No hx of blood clots.  Past Medical History  Diagnosis Date  . Lumbar degenerative disc disease 12/14/2013  . Migraine 12/14/2013  . Stress fracture of foot 12/14/2013   Past Surgical History  Procedure Laterality Date  . Knee surgery    . Bilateral carpal tunnel release     Family History  Problem Relation Age of Onset  . Alcoholism Mother   . Cancer    . Diabetes Mother     father    Social History  Substance Use Topics  . Smoking status: Current  Every Day Smoker -- 0.25 packs/day for 40 years    Types: Cigarettes  . Smokeless tobacco: None  . Alcohol Use: 1.8 oz/week    3 Standard drinks or equivalent per week    Review of Systems  Constitutional: Negative for fever and chills.  HENT: Negative for congestion, ear pain, sore throat, trouble swallowing and voice change.   Respiratory: Positive for shortness of breath ( mild). Negative for cough.   Cardiovascular: Positive for chest pain. Negative for palpitations.  Gastrointestinal: Negative for nausea, vomiting, abdominal pain and diarrhea.  Musculoskeletal: Negative for myalgias, back pain, arthralgias, neck pain and neck stiffness.  Skin: Negative for rash.  Neurological: Positive for light-headedness and numbness. Negative for dizziness, syncope, weakness and headaches.    Allergies  E-mycin  Home Medications   Prior to Admission medications   Medication Sig Start Date End Date Taking? Authorizing Provider  albuterol (PROVENTIL HFA;VENTOLIN HFA) 108 (90 BASE) MCG/ACT inhaler Inhale two puffs every 4-6 hours only as needed for shortness of breath or wheezing. 12/10/14 12/10/15  Sean Hommel, DO  albuterol (PROVENTIL) (2.5 MG/3ML) 0.083% nebulizer solution Take 3 mLs (2.5 mg total) by nebulization every 6 (six) hours as needed for wheezing or shortness of breath. 10/15/14   Sean Hommel, DO  montelukast (SINGULAIR) 10 MG tablet Take 1 tablet (10 mg total) by mouth at bedtime. 03/21/15   Laren Boom,  DO  SUMAtriptan (IMITREX) 100 MG tablet TAKE 1 TABLET BY MOUTH WITH FLUIDS ASAP AFTER ONSET OF MIGRAINE HEADACHE; MAY REPEAT AFTER 2 HOURS IF NEEDED, MAX 2 TABS IN 24 HOURS 02/28/15   Laren BoomSean Hommel, DO   Meds Ordered and Administered this Visit  Medications - No data to display  BP 138/86 mmHg  Pulse 85  Temp(Src) 98.3 F (36.8 C) (Oral)  Ht 5\' 11"  (1.803 m)  Wt 196 lb 12 oz (89.245 kg)  BMI 27.45 kg/m2  SpO2 94% No data found.   Physical Exam  Constitutional: He appears  well-developed and well-nourished.  Pt sitting on exam table, smiling. Appears well. NAD  HENT:  Head: Normocephalic and atraumatic.  Right Ear: Tympanic membrane normal.  Left Ear: Tympanic membrane normal.  Nose: Nose normal.  Mouth/Throat: Uvula is midline, oropharynx is clear and moist and mucous membranes are normal.  Eyes: Conjunctivae are normal. No scleral icterus.  Neck: Normal range of motion. Neck supple.  No cervical spinal or muscular tenderness.  Cardiovascular: Normal rate, regular rhythm and normal heart sounds.   Pulses:      Radial pulses are 2+ on the right side, and 2+ on the left side.  Pulmonary/Chest: Effort normal and breath sounds normal. No respiratory distress. He has no wheezes. He has no rales. He exhibits no tenderness.  Abdominal: Soft. He exhibits no distension. There is no tenderness.  Musculoskeletal: Normal range of motion.  No midline spinal tenderness. Full ROM upper and lower extremities with 5/5 strength bilaterally.   Neurological: He is alert.  Normal sensation Left arm  Skin: Skin is warm and dry.  Psychiatric: He has a normal mood and affect. His behavior is normal.  Nursing note and vitals reviewed.   ED Course  Procedures (including critical care time)  Labs Review Labs Reviewed - No data to display  Imaging Review Dg Chest 2 View  07/03/2015  CLINICAL DATA:  Chest tightness for 3 days EXAM: CHEST  2 VIEW COMPARISON:  06/23/2015 FINDINGS: Normal heart size. No pleural effusion or edema. No airspace consolidation. The visualized osseous structures appear intact. IMPRESSION: 1. No acute cardiopulmonary abnormalities. Electronically Signed   By: Signa Kellaylor  Stroud M.D.   On: 07/03/2015 14:23    Date/Time:07/03/15   13:36:38 Ventricular Rate: 84 PR Interval: 146 QRS Duration: 92 QT Interval: 338 QTC Calculation: 380 P-QRS-T: 57/-4/30 Text Interpretation: Sinus Rhythm, WNL  No prior to compare.     MDM   1. Chest tightness    2. Left arm numbness    Pt c/o chest tightness intermittent for 3 days and 1 episode of Left arm numbness today. Filed Vitals:   07/03/15 1344  BP: 138/86  Pulse: 85  Temp: 98.3 F (36.8 C)  O2 Sat 94% on RA. Pt denies SOB at this time.  Pt appears well. NAD. EKG: WNL   Reassured pt. Normal EKG CXR: Normal  Pt has previously scheduled f/u with PCP on Tuesday 6/6 at 9:30AM.  Strongly encouraged to keep appointment and may call tomorrow to see if any cancellations. Otherwise, stable for discharge home.  May need referral to cardiology if symptoms keep recurring for holter monitor or stress test. Discussed symptoms that warrant emergent care in the ED. Patient and wife verbalized understanding and agreement with treatment plan.   Junius FinnerErin O'Malley, PA-C 07/03/15 1600

## 2015-07-03 NOTE — ED Notes (Signed)
Pt c/o chest tightness x 3 days, lightheadness, numbness in left arm this am.

## 2015-07-04 ENCOUNTER — Encounter: Payer: Self-pay | Admitting: Family Medicine

## 2015-07-04 ENCOUNTER — Ambulatory Visit (INDEPENDENT_AMBULATORY_CARE_PROVIDER_SITE_OTHER): Payer: BC Managed Care – PPO | Admitting: Family Medicine

## 2015-07-04 VITALS — BP 122/83 | HR 86 | Wt 196.0 lb

## 2015-07-04 DIAGNOSIS — R0789 Other chest pain: Secondary | ICD-10-CM

## 2015-07-04 LAB — BASIC METABOLIC PANEL
BUN: 19 mg/dL (ref 7–25)
CALCIUM: 9.6 mg/dL (ref 8.6–10.3)
CO2: 27 mmol/L (ref 20–31)
Chloride: 103 mmol/L (ref 98–110)
Creat: 0.91 mg/dL (ref 0.70–1.33)
GLUCOSE: 134 mg/dL — AB (ref 65–99)
POTASSIUM: 4.7 mmol/L (ref 3.5–5.3)
SODIUM: 140 mmol/L (ref 135–146)

## 2015-07-04 LAB — CBC
HCT: 46.4 % (ref 38.5–50.0)
Hemoglobin: 15.4 g/dL (ref 13.2–17.1)
MCH: 31.2 pg (ref 27.0–33.0)
MCHC: 33.2 g/dL (ref 32.0–36.0)
MCV: 94.1 fL (ref 80.0–100.0)
MPV: 10.9 fL (ref 7.5–12.5)
Platelets: 329 10*3/uL (ref 140–400)
RBC: 4.93 MIL/uL (ref 4.20–5.80)
RDW: 13.2 % (ref 11.0–15.0)
WBC: 9.4 10*3/uL (ref 3.8–10.8)

## 2015-07-04 MED ORDER — PREDNISONE 50 MG PO TABS: 50.0000 mg | ORAL_TABLET | Freq: Every day | ORAL | Status: DC

## 2015-07-04 MED ORDER — IPRATROPIUM-ALBUTEROL 0.5-2.5 (3) MG/3ML IN SOLN
3.0000 mL | Freq: Once | RESPIRATORY_TRACT | Status: AC
  Administered 2015-07-04: 3 mL via RESPIRATORY_TRACT

## 2015-07-04 NOTE — Patient Instructions (Addendum)
Thank you for coming in today. Get labs today.  Get CT scan of the chest soon.  You should hear from cardiology soon.  We would like to see you later this week for recheck.  Start taking prednisone today.  Use albuterol as needed.  Call or go to the emergency room if you get worse, have trouble breathing, have chest pains, or palpitations.   Nonspecific Chest Pain  Chest pain can be caused by many different conditions. There is always a chance that your pain could be related to something serious, such as a heart attack or a blood clot in your lungs. Chest pain can also be caused by conditions that are not life-threatening. If you have chest pain, it is very important to follow up with your health care provider. CAUSES  Chest pain can be caused by:  Heartburn.  Pneumonia or bronchitis.  Anxiety or stress.  Inflammation around your heart (pericarditis) or lung (pleuritis or pleurisy).  A blood clot in your lung.  A collapsed lung (pneumothorax). It can develop suddenly on its own (spontaneous pneumothorax) or from trauma to the chest.  Shingles infection (varicella-zoster virus).  Heart attack.  Damage to the bones, muscles, and cartilage that make up your chest wall. This can include:  Bruised bones due to injury.  Strained muscles or cartilage due to frequent or repeated coughing or overwork.  Fracture to one or more ribs.  Sore cartilage due to inflammation (costochondritis). RISK FACTORS  Risk factors for chest pain may include:  Activities that increase your risk for trauma or injury to your chest.  Respiratory infections or conditions that cause frequent coughing.  Medical conditions or overeating that can cause heartburn.  Heart disease or family history of heart disease.  Conditions or health behaviors that increase your risk of developing a blood clot.  Having had chicken pox (varicella zoster). SIGNS AND SYMPTOMS Chest pain can feel like:  Burning or  tingling on the surface of your chest or deep in your chest.  Crushing, pressure, aching, or squeezing pain.  Dull or sharp pain that is worse when you move, cough, or take a deep breath.  Pain that is also felt in your back, neck, shoulder, or arm, or pain that spreads to any of these areas. Your chest pain may come and go, or it may stay constant. DIAGNOSIS Lab tests or other studies may be needed to find the cause of your pain. Your health care provider may have you take a test called an ambulatory ECG (electrocardiogram). An ECG records your heartbeat patterns at the time the test is performed. You may also have other tests, such as:  Transthoracic echocardiogram (TTE). During echocardiography, sound waves are used to create a picture of all of the heart structures and to look at how blood flows through your heart.  Transesophageal echocardiogram (TEE).This is a more advanced imaging test that obtains images from inside your body. It allows your health care provider to see your heart in finer detail.  Cardiac monitoring. This allows your health care provider to monitor your heart rate and rhythm in real time.  Holter monitor. This is a portable device that records your heartbeat and can help to diagnose abnormal heartbeats. It allows your health care provider to track your heart activity for several days, if needed.  Stress tests. These can be done through exercise or by taking medicine that makes your heart beat more quickly.  Blood tests.  Imaging tests. TREATMENT  Your treatment depends  on what is causing your chest pain. Treatment may include:  Medicines. These may include:  Acid blockers for heartburn.  Anti-inflammatory medicine.  Pain medicine for inflammatory conditions.  Antibiotic medicine, if an infection is present.  Medicines to dissolve blood clots.  Medicines to treat coronary artery disease.  Supportive care for conditions that do not require medicines.  This may include:  Resting.  Applying heat or cold packs to injured areas.  Limiting activities until pain decreases. HOME CARE INSTRUCTIONS  If you were prescribed an antibiotic medicine, finish it all even if you start to feel better.  Avoid any activities that bring on chest pain.  Do not use any tobacco products, including cigarettes, chewing tobacco, or electronic cigarettes. If you need help quitting, ask your health care provider.  Do not drink alcohol.  Take medicines only as directed by your health care provider.  Keep all follow-up visits as directed by your health care provider. This is important. This includes any further testing if your chest pain does not go away.  If heartburn is the cause for your chest pain, you may be told to keep your head raised (elevated) while sleeping. This reduces the chance that acid will go from your stomach into your esophagus.  Make lifestyle changes as directed by your health care provider. These may include:  Getting regular exercise. Ask your health care provider to suggest some activities that are safe for you.  Eating a heart-healthy diet. A registered dietitian can help you to learn healthy eating options.  Maintaining a healthy weight.  Managing diabetes, if necessary.  Reducing stress. SEEK MEDICAL CARE IF:  Your chest pain does not go away after treatment.  You have a rash with blisters on your chest.  You have a fever. SEEK IMMEDIATE MEDICAL CARE IF:   Your chest pain is worse.  You have an increasing cough, or you cough up blood.  You have severe abdominal pain.  You have severe weakness.  You faint.  You have chills.  You have sudden, unexplained chest discomfort.  You have sudden, unexplained discomfort in your arms, back, neck, or jaw.  You have shortness of breath at any time.  You suddenly start to sweat, or your skin gets clammy.  You feel nauseous or you vomit.  You suddenly feel  light-headed or dizzy.  Your heart begins to beat quickly, or it feels like it is skipping beats. These symptoms may represent a serious problem that is an emergency. Do not wait to see if the symptoms will go away. Get medical help right away. Call your local emergency services (911 in the [place] Do not drive yourself to the hospital.   This information is not intended to replace advice given to you by your health care provider. Make sure you discuss any questions you have with your health care provider.   Document Released: 10/25/2004 Document Revised: 02/05/2014 Document Reviewed: 08/21/2013 Elsevier Interactive Patient Education [employer]2016 Elsevier Inc.

## 2015-07-04 NOTE — Progress Notes (Signed)
Joseph Jacobs is a 60 y.o. male who presents to [facility] today for chest pain. Patient has had a 4 day history of chest pain and tightness. He was seen in urgent care on June 4 where EKG and chest x-ray were both normal. He has an appointment with his primary care provider tomorrow. Along with the symptoms he did note some left arm numbness as well. He notes the pain is very mild and sharp. The pain is not exertional at all. He does note some mild wheezing and cough. He thinks perhaps this is related to his asthma. He has used his albuterol inhaler and feels that it helps a bit. No vomiting or diarrhea or sweating. No feeling of doom.   Past Medical History  Diagnosis Date  . Lumbar degenerative disc disease 12/14/2013  . Migraine 12/14/2013  . Stress fracture of foot 12/14/2013   Past Surgical History  Procedure Laterality Date  . Knee surgery    . Bilateral carpal tunnel release     Social History  Substance Use Topics  . Smoking status: Current Every Day Smoker -- 0.25 packs/day for 40 years    Types: Cigarettes  . Smokeless tobacco: Not on file  . Alcohol Use: 1.8 oz/week    3 Standard drinks or equivalent per week   family history includes Alcoholism in his mother; Diabetes in his mother.  ROS as above:  Medications: Current Outpatient Prescriptions  Medication Sig Dispense Refill  . albuterol (PROVENTIL HFA;VENTOLIN HFA) 108 (90 BASE) MCG/ACT inhaler Inhale two puffs every 4-6 hours only as needed for shortness of breath or wheezing. 1 Inhaler 1  . albuterol (PROVENTIL) (2.5 MG/3ML) 0.083% nebulizer solution Take 3 mLs (2.5 mg total) by nebulization every 6 (six) hours as needed for wheezing or shortness of breath. 75 mL 12  . montelukast (SINGULAIR) 10 MG tablet Take 1 tablet (10 mg total) by mouth at bedtime. 90 tablet 1  . SUMAtriptan (IMITREX)  100 MG tablet TAKE 1 TABLET BY MOUTH WITH FLUIDS ASAP AFTER ONSET OF MIGRAINE HEADACHE; MAY REPEAT AFTER 2 HOURS IF NEEDED, MAX 2 TABS IN 24 HOURS 18 tablet 10   No current facility-administered medications for this visit.   Allergies  Allergen Reactions  . E-Mycin [Erythromycin] Other (See Comments)    GI upset      Exam:  BP 122/83 mmHg  Pulse 86  Wt 196 lb (88.905 kg)  SpO2 97%  Orthostatic VS for the past 24 hrs:  BP- Lying Pulse- Lying BP- Sitting Pulse- Sitting BP- Standing at 0 minutes Pulse- Standing at 0 minutes  07/04/15 1433 126/80 mmHg 80 135/81 mmHg 85 143/77 mmHg 92      Gen: Well NAD HEENT: EOMI,  MMM Lungs: Normal work of breathing. Coarse breath sounds with prolonged expiratory phase. Heart: RRR no MRG Abd: NABS, Soft. Nondistended, Nontender Exts: Brisk capillary refill, warm and well perfused.   Twelve-lead EKG shows normal sinus rhythm with 1 PVC. No ST segment elevation or depression. Otherwise normal EKG.  Patient was given a 2.5/0.5 mg DuoNeb nebulizer treatment, and felt a bit better  No results found for this or any previous visit (from the past 24 hour(s)). Dg Chest 2 View  07/03/2015  CLINICAL DATA:  Chest tightness for 3 days EXAM: CHEST  2 VIEW COMPARISON:  06/23/2015 FINDINGS: Normal heart size. No pleural effusion or edema. No airspace consolidation. The visualized osseous structures appear intact. IMPRESSION:  1. No acute cardiopulmonary abnormalities. Electronically Signed   By: Signa Kellaylor  Stroud M.D.   On: 07/03/2015 14:23      Assessment and Plan: [age] y.o. male with chest pain and shortness of breath. This is obviously concerning for acute coronary syndrome. However his EKG remains pretty normal. He responded to a DuoNeb nebulizer treatment indicating that his symptoms are probably related to RAD or bronchitis. However cardiac etiology remains a concern.  Plan for  Refer to cardiology.  Obtain CBC, BMP and Troponin I. Obtain CTA chest  tomorrow. Follow up later this week.     We carefully discussed warning signs or symptoms. Please see discharge instructions. Patient expresses understanding.

## 2015-07-05 ENCOUNTER — Ambulatory Visit (INDEPENDENT_AMBULATORY_CARE_PROVIDER_SITE_OTHER): Payer: BC Managed Care – PPO

## 2015-07-05 ENCOUNTER — Ambulatory Visit: Payer: Self-pay | Admitting: Family Medicine

## 2015-07-05 DIAGNOSIS — I2510 Atherosclerotic heart disease of native coronary artery without angina pectoris: Secondary | ICD-10-CM

## 2015-07-05 DIAGNOSIS — R0789 Other chest pain: Secondary | ICD-10-CM

## 2015-07-05 LAB — TROPONIN I

## 2015-07-05 MED ORDER — IOPAMIDOL (ISOVUE-370) INJECTION 76%
100.0000 mL | Freq: Once | INTRAVENOUS | Status: AC | PRN
  Administered 2015-07-05: 100 mL via INTRAVENOUS

## 2015-07-05 NOTE — Progress Notes (Signed)
Quick Note:  CT scan of the chest looked good. No lung issues. I think it is very important to get in with cardiology ASAP. ______

## 2015-07-05 NOTE — Progress Notes (Signed)
Quick Note:  Normal, no changes. ______ 

## 2015-07-06 ENCOUNTER — Ambulatory Visit (INDEPENDENT_AMBULATORY_CARE_PROVIDER_SITE_OTHER): Payer: BC Managed Care – PPO | Admitting: Family Medicine

## 2015-07-06 ENCOUNTER — Encounter: Payer: Self-pay | Admitting: Family Medicine

## 2015-07-06 VITALS — BP 149/84 | HR 93 | Wt 200.0 lb

## 2015-07-06 DIAGNOSIS — I2510 Atherosclerotic heart disease of native coronary artery without angina pectoris: Secondary | ICD-10-CM

## 2015-07-06 MED ORDER — ASPIRIN EC 81 MG PO TBEC: 81.0000 mg | DELAYED_RELEASE_TABLET | Freq: Every day | ORAL | Status: AC

## 2015-07-06 NOTE — Progress Notes (Signed)
CC: Joseph Jacobs Bures is a 60 y.o. male is here for Results   Subjective: HPI:  Patient is wife have come to ask a number of cardiac questions with the recent finding of calcification of his left anterior descending coronary artery. There is concern that there could be some stenosis is causing his chest discomfort. He wants to know what he is getting himself into should he need a stent or new medications to help manage plaque buildup. He would like to know about the anatomy of the heart and why an individual would be short of breath or have chest discomfort if there is stenosis or catheterized blood flow to the heart muscle. He's not really sure if the prednisone is helping his shortness of breath but is no longer having any left upper extremity numbness. He still gets intermittent chest pains that can happen at rest and he is not tried exerting himself since he was seen over the weekend. He denies any new lightheadedness, abdominal pain, back pain, nor any motor or sensory disturbances. No jaw pain.   Review Of Systems Outlined In HPI  Past Medical History  Diagnosis Date  . Lumbar degenerative disc disease 12/14/2013  . Migraine 12/14/2013  . Stress fracture of foot 12/14/2013  . Asthma     Past Surgical History  Procedure Laterality Date  . Knee surgery    . Bilateral carpal tunnel release     Family History  Problem Relation Age of Onset  . Alcoholism Mother   . Cancer    . Diabetes Mother     father     Social History   Social History  . Marital Status: Married    Spouse Name: N/A  . Number of Children: N/A  . Years of Education: N/A   Occupational History  . Not on file.   Social History Main Topics  . Smoking status: Current Every Day Smoker -- 0.25 packs/day for 40 years    Types: Cigarettes  . Smokeless tobacco: Not on file  . Alcohol Use: 1.8 oz/week    3 Standard drinks or equivalent per week  . Drug Use: No  . Sexual Activity:    Partners: Female    Other Topics Concern  . Not on file   Social History Narrative     Objective: BP 149/84 mmHg  Pulse 93  Wt 200 lb (90.719 kg)  Vital signs reviewed. General: Alert and Oriented, No Acute Distress HEENT: Pupils equal, round, reactive to light. Conjunctivae clear.  External ears unremarkable.  Moist mucous membranes. Lungs: Clear and comfortable work of breathing, speaking in full sentences without accessory muscle use. Cardiac: Regular rate and rhythm.  Neuro: CN II-XII grossly intact, gait normal. Extremities: No peripheral edema.  Strong peripheral pulses.  Mental Status: No depression, anxiety, nor agitation. Logical though process. Skin: Warm and dry.  Assessment & Plan: Joseph Jacobs was seen today for results.  Diagnoses and all orders for this visit:  Coronary artery disease involving native coronary artery of native heart without angina pectoris -     Lipid panel -     Ambulatory referral to Cardiology  Other orders -     aspirin EC 81 MG tablet; Take 1 tablet (81 mg total) by mouth daily.   Time was taken to answer all of his and his wife's questions. I strongly encouraged him to start a baby aspirin and to get a cholesterol panel done as soon as possible.  I discussed that by sending him to a  cardiologist I'm not signing him up for a guaranteed stent but I prepared him that he may need a stress test. Again I told him that I'm not guarantee him that he needs a stent but he had questions about what stents do, how they're delivered and why they're helpful.   40 minutes spent face-to-face during visit today of which at least 50% was counseling or coordinating care regarding: 1. Coronary artery disease involving native coronary artery of native heart without angina pectoris      Return in about 4 weeks (around 08/03/2015) for Chest Pain.

## 2015-07-07 LAB — LIPID PANEL
Cholesterol: 200 mg/dL (ref 125–200)
HDL: 60 mg/dL (ref >=40)
LDL CALC: 104 mg/dL (ref <130)
Total CHOL/HDL Ratio: 3.3 Ratio (ref <=5.0)
Triglycerides: 178 mg/dL — ABNORMAL HIGH (ref <150)
VLDL: 36 mg/dL — ABNORMAL HIGH (ref <30)

## 2015-07-08 MED ORDER — ATORVASTATIN CALCIUM 20 MG PO TABS: 20.0000 mg | ORAL_TABLET | Freq: Every day | ORAL | Status: DC

## 2015-07-08 NOTE — Addendum Note (Signed)
Addended by: Deno EtienneBARKLEY, Page Lancon L on: 07/08/2015 09:22 AM   Modules accepted: Orders

## 2015-07-11 ENCOUNTER — Encounter: Payer: Self-pay | Admitting: Physician Assistant

## 2015-07-11 NOTE — Progress Notes (Signed)
Cardiology Office Note    Date:  07/12/2015   ID:  Joseph Jacobs, DOB 1955/11/13, MRN [id number]  PCP:  Laren Boom, DO  Cardiologist:  New   CC: Chest pain  History of Present Illness:  Joseph Jacobs is a 60 y.o. male with a history of tobacco abuse, asthma and migraines who presents to clinic for evaluation of chest pain.   He was seen in the ER on 07/03/15 for chest tightness and left arm pain. He ruled out for MI and was discharged home. Patient subsequently had a CTA on 07/05/15 which showed the incidental finding of calcification in the LAD territories. He was seen by his PCP on 07/06/15 and continued to complain of chest pain. A lipid panel was ordered which showed TG 178 and LDL 104. He was started on atorvastatin 20mg  daily and was referred to cardiology for further evaluation and treatment.   Today he presents to clinic. He has noticed SOB x2 weeks while sitting at rest and even while talking. He also noted a dull pain in the left chest with some numbness in his left arm.  His heart feels like a dull, deep pain. This would feel better with getting up and walking around. Very aware of his heart beating and gets palpitations often.   He is playing golf a couple times a week but activity is limited by bulging discs, a torn hip labrum and separated metatarsal. He was mowing the lawn up until recently and did notice some SOB and chest tightness with exertion. No LE edema, orthopnea, PND. No dizziness or syncope. Some times he gets lightheaded when he fells SOB. No blood in stool or urine. No bleeding issues or history of CVA.  His mom had stents in her 80s. He thinks his brother may have heart problems. He is a smoker. He does smoke 3-5 cigarettes a day x 30 years. He drinks an occasional glass of wine at night. 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     Past Medical History  Diagnosis Date  . Lumbar degenerative disc disease 12/14/2013  .  Migraine 12/14/2013  . Stress fracture of foot 12/14/2013  . Asthma     Past Surgical History  Procedure Laterality Date  . Knee surgery    . Bilateral carpal tunnel release      Current Medications: Outpatient Prescriptions Prior to Visit  Medication Sig Dispense Refill  . albuterol (PROVENTIL HFA;VENTOLIN HFA) 108 (90 BASE) MCG/ACT inhaler Inhale two puffs every 4-6 hours only as needed for shortness of breath or wheezing. 1 Inhaler 1  . albuterol (PROVENTIL) (2.5 MG/3ML) 0.083% nebulizer solution Take 3 mLs (2.5 mg total) by nebulization every 6 (six) hours as needed for wheezing or shortness of breath. 75 mL 12  . aspirin EC 81 MG tablet Take 1 tablet (81 mg total) by mouth daily.    . montelukast (SINGULAIR) 10 MG tablet Take 1 tablet (10 mg total) by mouth at bedtime. 90 tablet 1  . SUMAtriptan (IMITREX) 100 MG tablet TAKE 1 TABLET BY MOUTH WITH FLUIDS ASAP AFTER ONSET OF MIGRAINE HEADACHE; MAY REPEAT AFTER 2 HOURS IF NEEDED, MAX 2 TABS IN 24 HOURS 18 tablet 10  . atorvastatin (LIPITOR) 20 MG tablet Take 1 tablet (20 mg total) by mouth at bedtime. 90 tablet 3  . predniSONE (DELTASONE) 50 MG tablet Take 1 tablet (50 mg total) by mouth daily. 5 tablet 0   No facility-administered medications prior  to visit.     Allergies:   E-mycin   Social History   Social History  . Marital Status: Married    Spouse Name: N/A  . Number of Children: N/A  . Years of Education: N/A   Social History Main Topics  . Smoking status: Current Every Day Smoker -- 0.25 packs/day for 40 years    Types: Cigarettes  . Smokeless tobacco: None  . Alcohol Use: 1.8 oz/week    3 Standard drinks or equivalent per week  . Drug Use: No  . Sexual Activity:    Partners: Female   Other Topics Concern  . None   Social History Narrative     Family History:  The patient's family history includes Alcoholism in his mother; Diabetes in his mother.   ROS:   Please see the history of present illness.      ROS All other systems reviewed and are negative.   PHYSICAL EXAM:   VS:  BP 130/80 mmHg  Pulse 80  Ht 5\' 11"  (1.803 m)  Wt 197 lb 9.6 oz (89.631 kg)  BMI 27.57 kg/m2   GEN: Well nourished, well developed, in no acute distress HEENT: normal Neck: no JVD, carotid bruits, or masses Cardiac: RRR; no murmurs, rubs, or gallops,no edema  Respiratory:  clear to auscultation bilaterally, normal work of breathing GI: soft, nontender, nondistended, + BS MS: no deformity or atrophy Skin: warm and dry, no rash Neuro:  Alert and Oriented x 3, Strength and sensation are intact Psych: euthymic mood, full affect  Wt Readings from Last 3 Encounters:  07/12/15 197 lb 9.6 oz (89.631 kg)  07/06/15 200 lb (90.719 kg)  07/04/15 196 lb (88.905 kg)      Studies/Labs Reviewed:   EKG:  EKG is ordered today.  The ekg ordered today demonstrates NSR with PVC; HR 77 on 07/05/15  Recent Labs: 12/10/2014: TSH 1.258 07/04/2015: BUN 19; Creat 0.91; Hemoglobin 15.4; Platelets 329; Potassium 4.7; Sodium 140   Lipid Panel    Component Value Date/Time   CHOL 200 07/06/2015 0939   TRIG 178* 07/06/2015 0939   HDL 60 07/06/2015 0939   CHOLHDL 3.3 07/06/2015 0939   VLDL 36* 07/06/2015 0939   LDLCALC 104 07/06/2015 0939    Additional studies/ records that were reviewed today include:  CTA and CXR  07/05/15 IMPRESSION: 1. No evidence of acute pulmonary embolism is seen. 2. No infiltrate or effusion. No suspicious lung nodule or adenopathy. 3. Calcification in the distribution of the left anterior descending coronary artery.  CXR: 07/03/15 IMPRESSION: 1. No acute cardiopulmonary abnormalities.   ASSESSMENT & PLAN:    Chest pain/SOB: patient does have RFs for CAD including long term tobacco abuse, HLD and documented calcification in LAD territory. ECG  With no acute ischemic changes. Will plan for exercise myoview. He does has hip and back pain but thinks he can walk okay. Continue ASA daily and  statin.   HLD: recent lipid panel w/ TG 178 and LDL 104. He was started on atorvastatin 20mg  daily. With documented calcifications on chest CT, will plan to increase this to moderate intensity statin therapy @ 40mg  daily.   Palpitations: he has subjective palpitations almost daily. Will plan for 48 hour holter monitor  Tobacco abuse: counseled on the importance of quitting.   Medication Adjustments/Labs and Tests Ordered: Current medicines are reviewed at length with the patient today.  Concerns regarding medicines are outlined above.  Medication changes, Labs and Tests ordered today are listed in  the Patient Instructions below. Patient Instructions  Medication Instructions:  Your physician has recommended you make the following change in your medication:  1.  INCREASE the Atorvastatin to 40 mg taking 1 tablet daily   Labwork: None ordered  Testing/Procedures: Your physician has recommended that you wear a holter monitor. Holter monitors are medical devices that record the heart's electrical activity. Doctors most often use these monitors to diagnose arrhythmias. Arrhythmias are problems with the speed or rhythm of the heartbeat. The monitor is a small, portable device. You can wear one while you do your normal daily activities. This is usually used to diagnose what is causing palpitations/syncope (passing out).   Your physician has requested that you have en exercise stress myoview. For further information please visit [email or web address] Please follow instruction sheet, as given.   Follow-Up: Your physician recommends that you schedule a follow-up appointment in: BASED UPON TEST RESULTS   Any Other Special Instructions Will Be Listed Below (If Applicable). Holter Monitoring A Holter monitor is a small device that is used to detect abnormal heart rhythms. It clips to your clothing and is connected by wires to flat, sticky disks (electrodes) that attach to your chest. It is worn  continuously for 24-48 hours. HOME CARE INSTRUCTIONS  Wear your Holter monitor at all times, even while exercising and sleeping, for as long as directed by your health care provider.  Make sure that the Holter monitor is safely clipped to your clothing or close to your body as recommended by your health care provider.  Do not get the monitor or wires wet.  Do not put body lotion or moisturizer on your chest.  Keep your skin clean.  Keep a diary of your daily activities, such as walking and doing chores. If you feel that your heartbeat is abnormal or that your heart is fluttering or skipping a beat:  Record what you are doing when it happens.  Record what time of day the symptoms occur.  Return your Holter monitor as directed by your health care provider.  Keep all follow-up visits as directed by your health care provider. This is important. SEEK IMMEDIATE MEDICAL CARE IF:  You feel lightheaded or you faint.  You have trouble breathing.  You feel pain in your chest, upper arm, or jaw.  You feel sick to your stomach and your skin is pale, cool, or damp.  You heartbeat feels unusual or abnormal.   This information is not intended to replace advice given to you by your health care provider. Make sure you discuss any questions you have with your health care provider.   Document Released: 10/14/2003 Document Revised: 02/05/2014 Document Reviewed: 08/24/2013 Elsevier Interactive Patient Education [address]      Signed, Cline Crock, [place]  07/12/2015 8:59 AM    [facility] [address] [place], [place], [place]  [address] Phone: [phone]; Fax: [phone]

## 2015-07-12 ENCOUNTER — Ambulatory Visit (INDEPENDENT_AMBULATORY_CARE_PROVIDER_SITE_OTHER): Payer: BC Managed Care – PPO | Admitting: Physician Assistant

## 2015-07-12 ENCOUNTER — Ambulatory Visit (INDEPENDENT_AMBULATORY_CARE_PROVIDER_SITE_OTHER): Payer: BC Managed Care – PPO

## 2015-07-12 ENCOUNTER — Encounter: Payer: Self-pay | Admitting: Physician Assistant

## 2015-07-12 VITALS — BP 130/80 | HR 80 | Ht 71.0 in | Wt 197.6 lb

## 2015-07-12 DIAGNOSIS — R002 Palpitations: Secondary | ICD-10-CM

## 2015-07-12 DIAGNOSIS — R079 Chest pain, unspecified: Secondary | ICD-10-CM

## 2015-07-12 MED ORDER — ATORVASTATIN CALCIUM 40 MG PO TABS: 40.0000 mg | ORAL_TABLET | Freq: Every day | ORAL | Status: DC

## 2015-07-12 NOTE — Patient Instructions (Addendum)
Medication Instructions:  Your physician has recommended you make the following change in your medication:  1.  INCREASE the Atorvastatin to 40 mg taking 1 tablet daily   Labwork: None ordered  Testing/Procedures: Your physician has recommended that you wear a holter monitor. Holter monitors are medical devices that record the heart's electrical activity. Doctors most often use these monitors to diagnose arrhythmias. Arrhythmias are problems with the speed or rhythm of the heartbeat. The monitor is a small, portable device. You can wear one while you do your normal daily activities. This is usually used to diagnose what is causing palpitations/syncope (passing out).   Your physician has requested that you have en exercise stress myoview. For further information please visit [email or web address]. Please follow instruction sheet, as given.   Follow-Up: Your physician recommends that you schedule a follow-up appointment in: BASED UPON TEST RESULTS   Any Other Special Instructions Will Be Listed Below (If Applicable). Holter Monitoring A Holter monitor is a small device that is used to detect abnormal heart rhythms. It clips to your clothing and is connected by wires to flat, sticky disks (electrodes) that attach to your chest. It is worn continuously for 24-48 hours. HOME CARE INSTRUCTIONS  Wear your Holter monitor at all times, even while exercising and sleeping, for as long as directed by your health care provider.  Make sure that the Holter monitor is safely clipped to your clothing or close to your body as recommended by your health care provider.  Do not get the monitor or wires wet.  Do not put body lotion or moisturizer on your chest.  Keep your skin clean.  Keep a diary of your daily activities, such as walking and doing chores. If you feel that your heartbeat is abnormal or that your heart is fluttering or skipping a beat:  Record what you are doing when it happens.  Record  what time of day the symptoms occur.  Return your Holter monitor as directed by your health care provider.  Keep all follow-up visits as directed by your health care provider. This is important. SEEK IMMEDIATE MEDICAL CARE IF:  You feel lightheaded or you faint.  You have trouble breathing.  You feel pain in your chest, upper arm, or jaw.  You feel sick to your stomach and your skin is pale, cool, or damp.  You heartbeat feels unusual or abnormal.   This information is not intended to replace advice given to you by your health care provider. Make sure you discuss any questions you have with your health care provider.   Document Released: 10/14/2003 Document Revised: 02/05/2014 Document Reviewed: 08/24/2013 Elsevier Interactive Patient Education [employer] Elsevier Inc.

## 2015-07-14 ENCOUNTER — Ambulatory Visit: Payer: Self-pay | Admitting: Family Medicine

## 2015-07-14 ENCOUNTER — Telehealth (HOSPITAL_COMMUNITY): Payer: Self-pay | Admitting: *Deleted

## 2015-07-14 NOTE — Telephone Encounter (Signed)
Left message on voicemail per DPR in reference to upcoming appointment scheduled on 07/19/15 at 0730 with detailed instructions given per Myocardial Perfusion Study Information Sheet for the test. LM to arrive 15 minutes early, and that it is imperative to arrive on time for appointment to keep from having the test rescheduled. If you need to cancel or reschedule your appointment, please call the office within 24 hours of your appointment. Failure to do so may result in a cancellation of your appointment, and a $50 no show fee. Phone number given for call back for any questions. Quynn Vilchis, Adelene IdlerCynthia W

## 2015-07-19 ENCOUNTER — Ambulatory Visit (HOSPITAL_COMMUNITY): Payer: BC Managed Care – PPO | Attending: Cardiology

## 2015-07-19 ENCOUNTER — Ambulatory Visit: Payer: BC Managed Care – PPO | Admitting: Cardiovascular Disease

## 2015-07-19 DIAGNOSIS — R0602 Shortness of breath: Secondary | ICD-10-CM | POA: Insufficient documentation

## 2015-07-19 DIAGNOSIS — R0609 Other forms of dyspnea: Secondary | ICD-10-CM | POA: Insufficient documentation

## 2015-07-19 DIAGNOSIS — R002 Palpitations: Secondary | ICD-10-CM | POA: Insufficient documentation

## 2015-07-19 DIAGNOSIS — R079 Chest pain, unspecified: Secondary | ICD-10-CM | POA: Diagnosis present

## 2015-07-19 LAB — MYOCARDIAL PERFUSION IMAGING
CHL CUP MPHR: 161 {beats}/min
CHL CUP NUCLEAR SSS: 7
CHL CUP RESTING HR STRESS: 72 {beats}/min
CSEPED: 8 min
CSEPEDS: 0 s
CSEPPBP: 190 mmHg
CSEPPHR: 141 {beats}/min
Estimated workload: 7.7 METS
LVDIAVOL: 92 mL (ref 62–150)
LVSYSVOL: 31 mL
NUC STRESS TID: 1.12
Percent HR: 87 %
RATE: 0.3
SDS: 0
SRS: 7

## 2015-07-19 MED ORDER — TECHNETIUM TC 99M TETROFOSMIN IV KIT
10.2000 | PACK | Freq: Once | INTRAVENOUS | Status: AC | PRN
  Administered 2015-07-19: 10 via INTRAVENOUS
  Filled 2015-07-19: qty 10

## 2015-07-19 MED ORDER — TECHNETIUM TC 99M TETROFOSMIN IV KIT
32.5000 | PACK | Freq: Once | INTRAVENOUS | Status: AC | PRN
  Administered 2015-07-19: 33 via INTRAVENOUS
  Filled 2015-07-19: qty 33

## 2015-07-20 ENCOUNTER — Telehealth: Payer: Self-pay | Admitting: Physician Assistant

## 2015-07-20 NOTE — Telephone Encounter (Signed)
New message ° ° ° ° ° °Calling to get stress test results °

## 2015-07-20 NOTE — Telephone Encounter (Signed)
New message       Calling to see if the stress test has been read.

## 2015-07-20 NOTE — Telephone Encounter (Signed)
Patient given stress test results. Patient wants to know when he needs to follow-up with Dr. Elberta Fortisamnitz, and he wants to know what his results are on his holter monitor. Informed patient that a message would be sent to Dr. Elberta Fortisamnitz and his nurse, and someone would be getting back with him on this information. Patient verbalized understanding.

## 2015-07-20 NOTE — Telephone Encounter (Signed)
Returned pts call to let him know that the stress test hasn't been resulted as of yet, but as soon as it is and I receive it, I will call him with the results. Pt appreciated the call back and verbalized understanding.

## 2015-07-21 NOTE — Telephone Encounter (Signed)
Follow up ° ° ° ° ° °Returning a call to the nurse °

## 2015-07-21 NOTE — Telephone Encounter (Signed)
-----   Message from Janetta HoraKathryn R Thompson, PA-C sent at 07/20/2015  4:58 PM EDT ----- Low risk stress test.

## 2015-07-21 NOTE — Telephone Encounter (Signed)
Pt has been made aware of his stress test results. He verbalized understanding.

## 2015-08-17 ENCOUNTER — Ambulatory Visit: Payer: BC Managed Care – PPO | Admitting: Cardiovascular Disease

## 2015-10-17 ENCOUNTER — Other Ambulatory Visit: Payer: Self-pay | Admitting: Family Medicine

## 2015-11-21 ENCOUNTER — Other Ambulatory Visit: Payer: Self-pay | Admitting: Family Medicine

## 2015-12-20 ENCOUNTER — Encounter: Payer: Self-pay | Admitting: Sports Medicine

## 2015-12-20 ENCOUNTER — Ambulatory Visit (INDEPENDENT_AMBULATORY_CARE_PROVIDER_SITE_OTHER): Payer: BC Managed Care – PPO | Admitting: Sports Medicine

## 2015-12-20 DIAGNOSIS — M5136 Other intervertebral disc degeneration, lumbar region: Secondary | ICD-10-CM | POA: Diagnosis not present

## 2015-12-20 DIAGNOSIS — M19071 Primary osteoarthritis, right ankle and foot: Secondary | ICD-10-CM

## 2015-12-20 DIAGNOSIS — M51369 Other intervertebral disc degeneration, lumbar region without mention of lumbar back pain or lower extremity pain: Secondary | ICD-10-CM

## 2015-12-20 NOTE — Assessment & Plan Note (Signed)
Ordering repeat left L4-L5 transforaminal epidural.

## 2015-12-20 NOTE — Progress Notes (Signed)
  Subjective:    CC: Follow-up  HPI: Right foot pain: Known foot osteoarthritis, previous first metatarsophalangeal joint injection was 2 years ago, having a recurrence of pain, moderate, persistent, localized and desires. Injection today.  Lumbar degenerative disc disease: Desires repeat epidural.  Past medical history:  Negative.  See flowsheet/record as well for more information.  Surgical history: Negative.  See flowsheet/record as well for more information.  Family history: Negative.  See flowsheet/record as well for more information.  Social history: Negative.  See flowsheet/record as well for more information.  Allergies, and medications have been entered into the medical record, reviewed, and no changes needed.   Review of Systems: No fevers, chills, night sweats, weight loss, chest pain, or shortness of breath.   Objective:    General: Well Developed, well nourished, and in no acute distress.  Neuro: Alert and oriented x3, extra-ocular muscles intact, sensation grossly intact.  HEENT: Normocephalic, atraumatic, pupils equal round reactive to light, neck supple, no masses, no lymphadenopathy, thyroid nonpalpable.  Skin: Warm and dry, no rashes. Cardiac: Regular rate and rhythm, no murmurs rubs or gallops, no lower extremity edema.  Respiratory: Clear to auscultation bilaterally. Not using accessory muscles, speaking in full sentences. Right Foot: No visible erythema or swelling. Range of motion is full in all directions. Strength is 5/5 in all directions. No hallux valgus. No pes cavus or pes planus. No abnormal callus noted. No pain over the navicular prominence, or base of fifth metatarsal. No tenderness to palpation of the calcaneal insertion of plantar fascia. No pain at the Achilles insertion. No pain over the calcaneal bursa. No pain of the retrocalcaneal bursa. No tenderness to palpation over the tarsals, metatarsals, or phalanges. No hallux rigidus or  limitus. Tender to palpation at the first metatarsophalangeal joint No pain with compression of the metatarsal heads. Neurovascularly intact distally.  Procedure: Real-time Ultrasound Guided Injection of right first MTP Device: GE Logiq E  Verbal informed consent obtained.  Time-out conducted.  Noted no overlying erythema, induration, or other signs of local infection.  Skin prepped in a sterile fashion.  Local anesthesia: Topical Ethyl chloride.  With sterile technique and under real time ultrasound guidance:  1/2 mL Kenalog 40, 1/2 mL lidocaine injected easily Completed without difficulty  Pain immediately resolved suggesting accurate placement of the medication.  Advised to call if fevers/chills, erythema, induration, drainage, or persistent bleeding.  Images permanently stored and available for review in the ultrasound unit.  Impression: Technically successful ultrasound guided injection.  Impression and Recommendations:    Lumbar degenerative disc disease Ordering repeat left L4-L5 transforaminal epidural.  Primary osteoarthritis of right foot It's been 2 years since a metatarsophalangeal joint injection, and one year since a tarsometatarsal injection. Repeat first metatarsophalangeal injection as above. Return as needed.

## 2015-12-20 NOTE — Assessment & Plan Note (Signed)
It's been 2 years since a metatarsophalangeal joint injection, and one year since a tarsometatarsal injection. Repeat first metatarsophalangeal injection as above. Return as needed.

## 2015-12-27 ENCOUNTER — Other Ambulatory Visit: Payer: Self-pay | Admitting: Family Medicine

## 2016-01-19 ENCOUNTER — Other Ambulatory Visit: Payer: Self-pay

## 2016-01-19 MED ORDER — ALBUTEROL SULFATE HFA 108 (90 BASE) MCG/ACT IN AERS: INHALATION_SPRAY | RESPIRATORY_TRACT | 3 refills | Status: DC

## 2016-01-26 ENCOUNTER — Other Ambulatory Visit: Payer: Self-pay | Admitting: Sports Medicine

## 2016-02-24 ENCOUNTER — Other Ambulatory Visit: Payer: Self-pay | Admitting: Sports Medicine

## 2016-03-08 ENCOUNTER — Encounter: Payer: Self-pay | Admitting: Sports Medicine

## 2016-03-08 ENCOUNTER — Ambulatory Visit (INDEPENDENT_AMBULATORY_CARE_PROVIDER_SITE_OTHER): Payer: BC Managed Care – PPO | Admitting: Sports Medicine

## 2016-03-08 DIAGNOSIS — M533 Sacrococcygeal disorders, not elsewhere classified: Secondary | ICD-10-CM | POA: Diagnosis not present

## 2016-03-08 DIAGNOSIS — Z20828 Contact with and (suspected) exposure to other viral communicable diseases: Secondary | ICD-10-CM

## 2016-03-08 DIAGNOSIS — J111 Influenza due to unidentified influenza virus with other respiratory manifestations: Secondary | ICD-10-CM | POA: Insufficient documentation

## 2016-03-08 DIAGNOSIS — R69 Illness, unspecified: Secondary | ICD-10-CM

## 2016-03-08 MED ORDER — SUMATRIPTAN SUCCINATE 100 MG PO TABS: ORAL_TABLET | ORAL | 11 refills | Status: DC

## 2016-03-08 MED ORDER — OSELTAMIVIR PHOSPHATE 75 MG PO CAPS: 75.0000 mg | ORAL_CAPSULE | Freq: Every day | ORAL | 0 refills | Status: DC

## 2016-03-08 MED ORDER — MONTELUKAST SODIUM 10 MG PO TABS: 10.0000 mg | ORAL_TABLET | Freq: Every day | ORAL | 3 refills | Status: DC

## 2016-03-08 NOTE — Progress Notes (Signed)
  Subjective:    CC: hip and back pain  HPI: Joseph Jacobs returns, he never got a phone call about his epidural, he does have a new kind of pain however, tells me is different than his typical discogenic pain. His pain is localized at the right SI joint, worse with standing, twisting. Moderate, persistent with radiation to the back of the knee but not past the knee, no paresthesias, no bowel or bladder dysfunction, saddle numbness, constitutional symptoms.  His son has influenza, he desires prophylaxis.  Past medical history:  Negative.  See flowsheet/record as well for more information.  Surgical history: Negative.  See flowsheet/record as well for more information.  Family history: Negative.  See flowsheet/record as well for more information.  Social history: Negative.  See flowsheet/record as well for more information.  Allergies, and medications have been entered into the medical record, reviewed, and no changes needed.   Review of Systems: No fevers, chills, night sweats, weight loss, chest pain, or shortness of breath.   Objective:    General: Well Developed, well nourished, and in no acute distress.  Neuro: Alert and oriented x3, extra-ocular muscles intact, sensation grossly intact.  HEENT: Normocephalic, atraumatic, pupils equal round reactive to light, neck supple, no masses, no lymphadenopathy, thyroid nonpalpable.  Skin: Warm and dry, no rashes. Cardiac: Regular rate and rhythm, no murmurs rubs or gallops, no lower extremity edema.  Respiratory: Clear to auscultation bilaterally. Not using accessory muscles, speaking in full sentences. Back Exam:  Inspection: Unremarkable  Motion: Flexion 45 deg, Extension 45 deg, Side Bending to 45 deg bilaterally,  Rotation to 45 deg bilaterally  SLR laying: Negative  XSLR laying: Negative  Palpable tenderness: right SI joint. FABER: negative. Sensory change: Gross sensation intact to all lumbar and sacral dermatomes.  Reflexes: 2+ at  both patellar tendons, 2+ at achilles tendons, Babinski's downgoing.  Strength at foot  Plantar-flexion: 5/5 Dorsi-flexion: 5/5 Eversion: 5/5 Inversion: 5/5  Leg strength  Quad: 5/5 Hamstring: 5/5 Hip flexor: 5/5 Hip abductors: 5/5  Gait unremarkable.  Procedure: Real-time Ultrasound Guided Injection of right SI joint Device: GE Logiq E  Verbal informed consent obtained.  Time-out conducted.  Noted no overlying erythema, induration, or other signs of local infection.  Skin prepped in a sterile fashion.  Local anesthesia: Topical Ethyl chloride.  With sterile technique and under real time ultrasound guidance:  Taking care to avoid the S1 foramen eyedrops the needle into the SI joint and injected 1 mL kenalog 40, 2 mL lidocaine, 2 mL Marcaine. Completed without difficulty  Pain immediately resolved suggesting accurate placement of the medication.  Advised to call if fevers/chills, erythema, induration, drainage, or persistent bleeding.  Images permanently stored and available for review in the ultrasound unit.  Impression: Technically successful ultrasound guided injection.  Impression and Recommendations:    Pain of right sacroiliac joint SI joint injection as above. Formal physical therapy. This pain is different from his disc type pain, and he has not had his epidural. I think we can hold off on the epidural for now.  Exposure to influenza Prophylactic Tamiflu.  I spent 40 minutes with this patient, greater than 50% was face-to-face time counseling regarding the above diagnoses, this was separate from the time spent performing the above procedure

## 2016-03-08 NOTE — Assessment & Plan Note (Signed)
Prophylactic Tamiflu

## 2016-03-08 NOTE — Assessment & Plan Note (Signed)
SI joint injection as above. Formal physical therapy. This pain is different from his disc type pain, and he has not had his epidural. I think we can hold off on the epidural for now.

## 2016-03-19 ENCOUNTER — Ambulatory Visit (INDEPENDENT_AMBULATORY_CARE_PROVIDER_SITE_OTHER): Payer: BC Managed Care – PPO | Admitting: Sports Medicine

## 2016-03-19 ENCOUNTER — Encounter: Payer: Self-pay | Admitting: Sports Medicine

## 2016-03-19 DIAGNOSIS — M5136 Other intervertebral disc degeneration, lumbar region: Secondary | ICD-10-CM

## 2016-03-19 DIAGNOSIS — M51369 Other intervertebral disc degeneration, lumbar region without mention of lumbar back pain or lower extremity pain: Secondary | ICD-10-CM

## 2016-03-19 NOTE — Progress Notes (Signed)
  Subjective:    CC: Follow-up  HPI: This is a pleasant 61 year old male, he has multifactorial back pain. We have been treating him with multiple modalities including epidurals, which have helped in the past on the left side. More recently he had pain around the right SI joint, this is injected but he only had temporary relief for a few hours and then full recurrence of pain. On further questioning his pain is worse with standing, it's eased by sitting and driving, not worse with Valsalva, radiation to the anterior thigh on the right but not past the knee. No bowel or bladder dysfunction, saddle numbness, constitutional symptoms.  Past medical history:  Negative.  See flowsheet/record as well for more information.  Surgical history: Negative.  See flowsheet/record as well for more information.  Family history: Negative.  See flowsheet/record as well for more information.  Social history: Negative.  See flowsheet/record as well for more information.  Allergies, and medications have been entered into the medical record, reviewed, and no changes needed.   Review of Systems: No fevers, chills, night sweats, weight loss, chest pain, or shortness of breath.   Objective:    General: Well Developed, well nourished, and in no acute distress.  Neuro: Alert and oriented x3, extra-ocular muscles intact, sensation grossly intact.  HEENT: Normocephalic, atraumatic, pupils equal round reactive to light, neck supple, no masses, no lymphadenopathy, thyroid nonpalpable.  Skin: Warm and dry, no rashes. Cardiac: Regular rate and rhythm, no murmurs rubs or gallops, no lower extremity edema.  Respiratory: Clear to auscultation bilaterally. Not using accessory muscles, speaking in full sentences.  I personally reviewed his MRI scan again, he has multilevel lumbar spondylosis, severe degenerative disc disease at the L2-L3 level, also at the L4-L5 level with central canal stenosis, the most prominent finding at the  L4-L5 level is severe right L4-L5 facet arthritis.  Impression and Recommendations:    Lumbar degenerative disc disease Has done well in the past with a left L4-L5 epidural, most recent right SI joint injection provided only temporary relief and not very much so. He does have fairly severe right L4-L5 facet arthritis that I think is contributing to his pain, we will proceed with a facet injection for diagnostic and therapeutic purposes and then medial branch blocks and radiofrequency ablation if good relief. If he does not keep good relief then certainly proceeding with an epidural on the right at L4-L5 would be reasonable. Degenerative changes are quite diffuse however and if the L4-L5 facet is not his present pain generator he would most likely be a candidate for medical pain management.

## 2016-03-19 NOTE — Assessment & Plan Note (Signed)
Has done well in the past with a left L4-L5 epidural, most recent right SI joint injection provided only temporary relief and not very much so. He does have fairly severe right L4-L5 facet arthritis that I think is contributing to his pain, we will proceed with a facet injection for diagnostic and therapeutic purposes and then medial branch blocks and radiofrequency ablation if good relief. If he does not keep good relief then certainly proceeding with an epidural on the right at L4-L5 would be reasonable. Degenerative changes are quite diffuse however and if the L4-L5 facet is not his present pain generator he would most likely be a candidate for medical pain management.

## 2016-04-03 ENCOUNTER — Ambulatory Visit
Admission: RE | Admit: 2016-04-03 | Discharge: 2016-04-03 | Disposition: A | Discharge status: Home / Self Care | Payer: BC Managed Care – PPO | Source: Ambulatory Visit | Attending: Sports Medicine | Admitting: Sports Medicine

## 2016-04-03 MED ORDER — IOPAMIDOL (ISOVUE-M 200) INJECTION 41%
1.0000 mL | Freq: Once | INTRAMUSCULAR | Status: AC
  Administered 2016-04-03: 1 mL via INTRA_ARTICULAR

## 2016-04-03 MED ORDER — METHYLPREDNISOLONE ACETATE 40 MG/ML INJ SUSP (RADIOLOG
120.0000 mg | Freq: Once | INTRAMUSCULAR | Status: AC
  Administered 2016-04-03: 120 mg via INTRA_ARTICULAR

## 2016-04-03 NOTE — Discharge Instructions (Signed)

## 2016-04-24 ENCOUNTER — Other Ambulatory Visit: Payer: Self-pay | Admitting: Sports Medicine

## 2016-04-24 ENCOUNTER — Ambulatory Visit (INDEPENDENT_AMBULATORY_CARE_PROVIDER_SITE_OTHER): Payer: BC Managed Care – PPO

## 2016-04-24 ENCOUNTER — Encounter: Payer: Self-pay | Admitting: Sports Medicine

## 2016-04-24 ENCOUNTER — Ambulatory Visit (INDEPENDENT_AMBULATORY_CARE_PROVIDER_SITE_OTHER): Payer: BC Managed Care – PPO | Admitting: Sports Medicine

## 2016-04-24 DIAGNOSIS — R509 Fever, unspecified: Secondary | ICD-10-CM | POA: Diagnosis not present

## 2016-04-24 DIAGNOSIS — H6123 Impacted cerumen, bilateral: Secondary | ICD-10-CM | POA: Diagnosis not present

## 2016-04-24 DIAGNOSIS — F17200 Nicotine dependence, unspecified, uncomplicated: Secondary | ICD-10-CM

## 2016-04-24 DIAGNOSIS — R69 Illness, unspecified: Secondary | ICD-10-CM | POA: Diagnosis not present

## 2016-04-24 DIAGNOSIS — J111 Influenza due to unidentified influenza virus with other respiratory manifestations: Secondary | ICD-10-CM

## 2016-04-24 DIAGNOSIS — R05 Cough: Secondary | ICD-10-CM

## 2016-04-24 MED ORDER — OSELTAMIVIR PHOSPHATE 75 MG PO CAPS: 75.0000 mg | ORAL_CAPSULE | Freq: Two times a day (BID) | ORAL | 0 refills | Status: DC

## 2016-04-24 MED ORDER — ALBUTEROL SULFATE (2.5 MG/3ML) 0.083% IN NEBU: 2.5000 mg | INHALATION_SOLUTION | Freq: Four times a day (QID) | RESPIRATORY_TRACT | 12 refills | Status: DC | PRN

## 2016-04-24 MED ORDER — PREDNISONE 50 MG PO TABS: 50.0000 mg | ORAL_TABLET | Freq: Every day | ORAL | 0 refills | Status: DC

## 2016-04-24 MED ORDER — AZITHROMYCIN 250 MG PO TABS: ORAL_TABLET | ORAL | 0 refills | Status: DC

## 2016-04-24 NOTE — Assessment & Plan Note (Signed)
I'm going to hit him hard considering his history of pneumonia, Tamiflu, prednisone, azithromycin, chest x-ray.

## 2016-04-24 NOTE — Progress Notes (Signed)
  Subjective:    CC: Feeling sick  HPI: This is a pleasant 61 year old male, he is here with a day and a half of fevers, chills, cough, fatigue, no GI symptoms, no rash. Mild rigors.  Symptoms are moderate, persistent.  Of note he did extremely well after the facet joint injection.  Past medical history:  Negative.  See flowsheet/record as well for more information.  Surgical history: Negative.  See flowsheet/record as well for more information.  Family history: Negative.  See flowsheet/record as well for more information.  Social history: Negative.  See flowsheet/record as well for more information.  Allergies, and medications have been entered into the medical record, reviewed, and no changes needed.   Review of Systems: No fevers, chills, night sweats, weight loss, chest pain, or shortness of breath.   Objective:    General: Well Developed, well nourished, and in no acute distress.  Neuro: Alert and oriented x3, extra-ocular muscles intact, sensation grossly intact.  HEENT: Normocephalic, atraumatic, pupils equal round reactive to light, neck supple, no masses, no lymphadenopathy, thyroid nonpalpable. Oropharynx, nasopharynx, ear canals unremarkable with the exception of impacted cerumen.  Skin: Warm and dry, no rashes. Cardiac: Regular rate and rhythm, no murmurs rubs or gallops, no lower extremity edema.  Respiratory: Clear to auscultation bilaterally. Not using accessory muscles, speaking in full sentences.  Indication: Cerumen impaction of the left and right ear(s) Medical necessity statement: On physical examination, cerumen impairs clinically significant portions of the external auditory canal, and tympanic membrane. Noted obstructive, copious cerumen that cannot be removed without magnification and instrumentations requiring physician skills Consent: Discussed benefits and risks of procedure and verbal consent obtained Procedure: Patient was prepped for the procedure. Utilized  an otoscope to assess and take note of the ear canal, the tympanic membrane, and the presence, amount, and placement of the cerumen. Gentle water irrigation and soft plastic curette was utilized to remove cerumen.  Post procedure examination: shows cerumen was completely removed. Patient tolerated procedure well. The patient is made aware that they may experience temporary vertigo, temporary hearing loss, and temporary discomfort. If these symptom last for more than 24 hours to call the clinic or proceed to the ED.  Impression and Recommendations:    Influenza-like illness I'm going to hit him hard considering his history of pneumonia, Tamiflu, prednisone, azithromycin, chest x-ray.

## 2016-04-25 ENCOUNTER — Telehealth: Payer: Self-pay

## 2016-04-25 NOTE — Telephone Encounter (Signed)
Pt left VM stating he's allergic to erythromycin and is verifying if zithromax is ok to take. Please advise.

## 2016-04-26 ENCOUNTER — Telehealth: Payer: Self-pay | Admitting: *Deleted

## 2016-04-26 NOTE — Telephone Encounter (Signed)
Approved.  He should start it ASAP.  If he worsens I would recommend he take action against [employer] for their PA delaying coverage of Tamiflu.  I'm trying to get in touch with their medical director to have them change their policy for denying Tamiflu.

## 2016-04-26 NOTE — Telephone Encounter (Signed)
Awesome! Left  Message on patient's vm that medication has been approved by the insurance company to go ahead and pick it up per Dr. Lucienne Minkshekkekandam's reccomedation and that Dr. Benjamin Stainhekkekandam is speaking with medical director to try and get the policy changed for denying tamiflu.   The patient did state that he had tamiflu left over from when his son had the flu this year. Tamiflu was prescribed to him (the patient) as a prophylactic and he never took it. So he started taking it after he was diagnosed in our office with a flu like illness but only had the 5 day course as it was a prophylactic dosing.

## 2016-04-26 NOTE — Telephone Encounter (Signed)
A PA was started for this patient for tamiflu however it is already past the 48 hour mark.   Called the patient to see how he was feeling. Patient states he is feeling a little better. He states he has the " fever under control but has some tightness in his chest." He feels the overall he is getting better and the prednisone and zpak has helped. Advised the patient that with history of pneumonia we want to be alert to any symptoms that may be indicative of a secondary infection such as a spiking a high fever, suddenly starting to feel bad after having felt better, developing a cough, SOB, ect. Advised to call the office if he starts to feel worse.  This is just an FYI given patients hx of pneumonia. If there is any further treatment you recommend please advise.

## 2016-04-26 NOTE — Telephone Encounter (Signed)
I think for him it's more of an intolerance with GI upset with erythromycin, this doesn't mean it will happen with azithromycin.

## 2016-04-26 NOTE — Telephone Encounter (Signed)
Pt.notified

## 2016-05-04 ENCOUNTER — Other Ambulatory Visit: Payer: Self-pay

## 2016-05-04 MED ORDER — SUMATRIPTAN SUCCINATE 100 MG PO TABS: ORAL_TABLET | ORAL | 11 refills | Status: DC

## 2016-06-28 ENCOUNTER — Encounter: Payer: Self-pay | Admitting: Sports Medicine

## 2016-06-28 ENCOUNTER — Ambulatory Visit (INDEPENDENT_AMBULATORY_CARE_PROVIDER_SITE_OTHER): Payer: BC Managed Care – PPO | Admitting: Sports Medicine

## 2016-06-28 DIAGNOSIS — M7741 Metatarsalgia, right foot: Secondary | ICD-10-CM | POA: Diagnosis not present

## 2016-06-28 MED ORDER — MELOXICAM 15 MG PO TABS: 15.0000 mg | ORAL_TABLET | Freq: Every day | ORAL | 3 refills | Status: DC

## 2016-06-28 NOTE — Assessment & Plan Note (Signed)
Existing boot is in terrible shape. New boot with metatarsal pad. He will return for custom orthotics with metatarsal pad. If insufficient relief after a month of this we will inject the second, third, and fourth MTPs.

## 2016-06-28 NOTE — Progress Notes (Signed)
  Subjective:    CC: Right foot pain  HPI: Joseph Jacobs is a pleasant 61 year old male, he is the dean of admissions at [employer]. For several weeks now he's had pain that he localizes on the plantar aspect of his right second, third, and fourth metatarsal heads. He recently put back on an old boot which is in terrible disrepair. Pain is severe, persistent, localized without much radiation. This feels different from his previous osteoarthritic type pain.  Past medical history:  Negative.  See flowsheet/record as well for more information.  Surgical history: Negative.  See flowsheet/record as well for more information.  Family history: Negative.  See flowsheet/record as well for more information.  Social history: Negative.  See flowsheet/record as well for more information.  Allergies, and medications have been entered into the medical record, reviewed, and no changes needed.   Review of Systems: No fevers, chills, night sweats, weight loss, chest pain, or shortness of breath.   Objective:    General: Well Developed, well nourished, and in no acute distress.  Neuro: Alert and oriented x3, extra-ocular muscles intact, sensation grossly intact.  HEENT: Normocephalic, atraumatic, pupils equal round reactive to light, neck supple, no masses, no lymphadenopathy, thyroid nonpalpable.  Skin: Warm and dry, no rashes. Cardiac: Regular rate and rhythm, no murmurs rubs or gallops, no lower extremity edema.  Respiratory: Clear to auscultation bilaterally. Not using accessory muscles, speaking in full sentences. Right Foot: No visible erythema or swelling. Range of motion is full in all directions. Strength is 5/5 in all directions. No hallux valgus. No pes cavus or pes planus. No abnormal callus noted. No pain over the navicular prominence, or base of fifth metatarsal. No tenderness to palpation of the calcaneal insertion of plantar fascia. No pain at the Achilles insertion. No pain over the calcaneal  bursa. No pain of the retrocalcaneal bursa. No tenderness to palpation over the tarsals, metatarsals, or phalanges. No hallux rigidus or limitus. No tenderness palpation over interphalangeal joints. No pain with compression of the metatarsal heads. Neurovascularly intact distally. Foot inspection and palpation reveals breakdown of the transverse arch and a drop of MT heads. TTP under the metatarsal heads.  Metatarsal pad placed in a new CAM boot. He ambulated in this and was pain-free.  Impression and Recommendations:    Metatarsalgia of right foot Existing boot is in terrible shape. New boot with metatarsal pad. He will return for custom orthotics with metatarsal pad. If insufficient relief after a month of this we will inject the second, third, and fourth MTPs.

## 2016-07-02 ENCOUNTER — Encounter: Payer: Self-pay | Admitting: Sports Medicine

## 2016-07-02 ENCOUNTER — Ambulatory Visit (INDEPENDENT_AMBULATORY_CARE_PROVIDER_SITE_OTHER): Payer: BC Managed Care – PPO | Admitting: Sports Medicine

## 2016-07-02 DIAGNOSIS — M7741 Metatarsalgia, right foot: Secondary | ICD-10-CM | POA: Diagnosis not present

## 2016-07-02 NOTE — Progress Notes (Signed)
    Patient was fitted for a : standard, cushioned, semi-rigid orthotic. The orthotic was heated and afterward the patient stood on the orthotic blank positioned on the orthotic stand. The patient was positioned in subtalar neutral position and 10 degrees of ankle dorsiflexion in a weight bearing stance. After completion of molding, a stable base was applied to the orthotic blank. The blank was ground to a stable position for weight bearing. Size: 13 Base: [facility] Additional Posting and Padding: Right-sided metatarsal pad, orthotics for both trimmed at the tip. The patient ambulated these, and they were very comfortable.  I spent 40 minutes with this patient, greater than 50% was face-to-face time counseling regarding the below diagnosis.

## 2016-07-02 NOTE — Assessment & Plan Note (Signed)
Fantastic response to adding a metatarsal pad to the boot, custom orthotics today with metatarsal pad.  Return as needed.

## 2016-09-03 ENCOUNTER — Ambulatory Visit (INDEPENDENT_AMBULATORY_CARE_PROVIDER_SITE_OTHER): Payer: BC Managed Care – PPO | Admitting: Sports Medicine

## 2016-09-03 ENCOUNTER — Telehealth: Payer: Self-pay | Admitting: *Deleted

## 2016-09-03 VITALS — BP 126/77 | HR 90 | Wt 205.0 lb

## 2016-09-03 DIAGNOSIS — Z0000 Encounter for general adult medical examination without abnormal findings: Secondary | ICD-10-CM | POA: Diagnosis not present

## 2016-09-03 DIAGNOSIS — Z13220 Encounter for screening for lipoid disorders: Secondary | ICD-10-CM

## 2016-09-03 DIAGNOSIS — M51369 Other intervertebral disc degeneration, lumbar region without mention of lumbar back pain or lower extremity pain: Secondary | ICD-10-CM

## 2016-09-03 DIAGNOSIS — M5136 Other intervertebral disc degeneration, lumbar region: Secondary | ICD-10-CM

## 2016-09-03 MED ORDER — CYCLOBENZAPRINE HCL 10 MG PO TABS: ORAL_TABLET | ORAL | 0 refills | Status: DC

## 2016-09-03 MED ORDER — ATORVASTATIN CALCIUM 40 MG PO TABS: 40.0000 mg | ORAL_TABLET | Freq: Every day | ORAL | 0 refills | Status: DC

## 2016-09-03 NOTE — Assessment & Plan Note (Signed)
Fantastic response with right L4-L5 facet injection. Repeating this, ultimately we will proceed with RFA.

## 2016-09-03 NOTE — Progress Notes (Signed)
  Subjective:    CC: Follow-up   HPI: Several months ago this pleasant 61 year old male had a right L4-L5 facet joint injection with fantastic relief, having recurrence of pain and desires repeat.  Preventive measures: Due for routine blood work.  Past medical history:  Negative.  See flowsheet/record as well for more information.  Surgical history: Negative.  See flowsheet/record as well for more information.  Family history: Negative.  See flowsheet/record as well for more information.  Social history: Negative.  See flowsheet/record as well for more information.  Allergies, and medications have been entered into the medical record, reviewed, and no changes needed.   Review of Systems: No fevers, chills, night sweats, weight loss, chest pain, or shortness of breath.   Objective:    General: Well Developed, well nourished, and in no acute distress.  Neuro: Alert and oriented x3, extra-ocular muscles intact, sensation grossly intact.  HEENT: Normocephalic, atraumatic, pupils equal round reactive to light, neck supple, no masses, no lymphadenopathy, thyroid nonpalpable.  Skin: Warm and dry, no rashes. Cardiac: Regular rate and rhythm, no murmurs rubs or gallops, no lower extremity edema.  Respiratory: Clear to auscultation bilaterally. Not using accessory muscles, speaking in full sentences.  Impression and Recommendations:    Lumbar degenerative disc disease Fantastic response with right L4-L5 facet injection. Repeating this, ultimately we will proceed with RFA.  Annual physical exam Checking routine blood work.  I spent 25 minutes with this patient, greater than 50% was face-to-face time counseling regarding the above diagnoses

## 2016-09-03 NOTE — Assessment & Plan Note (Signed)
Checking routine bloodwork. 

## 2016-09-03 NOTE — Telephone Encounter (Signed)
lvm asking that pt get scheduled for R L4-L5 facet injections. I gave pt's MRN, DOB. And my call back # .Joseph PacasBarkley, [name]

## 2016-09-05 NOTE — Telephone Encounter (Signed)
Spoke w/roberta she is working on this.Joseph PacasBarkley, Kamsiyochukwu [place]etta

## 2016-09-26 ENCOUNTER — Ambulatory Visit (INDEPENDENT_AMBULATORY_CARE_PROVIDER_SITE_OTHER): Payer: BC Managed Care – PPO | Admitting: Sports Medicine

## 2016-09-26 ENCOUNTER — Encounter: Payer: Self-pay | Admitting: Sports Medicine

## 2016-09-26 ENCOUNTER — Ambulatory Visit (INDEPENDENT_AMBULATORY_CARE_PROVIDER_SITE_OTHER): Payer: BC Managed Care – PPO

## 2016-09-26 DIAGNOSIS — G43809 Other migraine, not intractable, without status migrainosus: Secondary | ICD-10-CM | POA: Diagnosis not present

## 2016-09-26 DIAGNOSIS — Z955 Presence of coronary angioplasty implant and graft: Secondary | ICD-10-CM

## 2016-09-26 DIAGNOSIS — R079 Chest pain, unspecified: Secondary | ICD-10-CM

## 2016-09-26 DIAGNOSIS — I2510 Atherosclerotic heart disease of native coronary artery without angina pectoris: Secondary | ICD-10-CM | POA: Insufficient documentation

## 2016-09-26 LAB — CBC
HCT: 48.5 % (ref 38.5–50.0)
Hemoglobin: 16.4 g/dL (ref 13.2–17.1)
MCH: 31.8 pg (ref 27.0–33.0)
MCHC: 33.8 g/dL (ref 32.0–36.0)
MCV: 94.2 fL (ref 80.0–100.0)
MPV: 11.1 fL (ref 7.5–12.5)
Platelets: 294 K/uL (ref 140–400)
RBC: 5.15 MIL/uL (ref 4.20–5.80)
RDW: 12.9 % (ref 11.0–15.0)
WBC: 6.6 10*3/uL (ref 3.8–10.8)

## 2016-09-26 MED ORDER — NITROGLYCERIN 0.4 MG SL SUBL: 0.4000 mg | SUBLINGUAL_TABLET | SUBLINGUAL | 0 refills | Status: DC | PRN

## 2016-09-26 MED ORDER — ATENOLOL 25 MG PO TABS: 25.0000 mg | ORAL_TABLET | Freq: Every day | ORAL | 3 refills | Status: DC

## 2016-09-26 NOTE — Addendum Note (Signed)
Addended by: Baird Kay on: 09/26/2016 11:43 AM   Modules accepted: Orders

## 2016-09-26 NOTE — Assessment & Plan Note (Signed)
Checking some blood work including cardiac enzymes, lipids. I do think this is typical chest pain, worse with exertion while walking plan golf, better with rest. Occasional radiation to the left arm. He did have a negative nuclear stress test last year, there was some artifact at the apex, he also had a CT angiogram that showed some calcification in the LAD. Also worsening of chest pain with Imitrex, I have advised him to discontinue this. He also came off of his cholesterol medication for a while, recently restarted. With this constellation of exertional chest pain, calcification in the LAD, worsening of chest pain with an ergo alkaloid, as well as noncompliance with his statin I do think we should proceed with cardiac catheterization, I'm going to do an urgent referral back to cardiology, and give him some nitroglycerin to hold onto. We will also use atenolol to help prevent his migraines, which also offer some cardiovascular protection.

## 2016-09-26 NOTE — Assessment & Plan Note (Signed)
Due to chest pain and concern for angina with Imitrex we are going to discontinue this and use atenolol in its place to prevent migraines.

## 2016-09-26 NOTE — Progress Notes (Signed)
Subjective:    CC: Chest pain  HPI: This is a pleasant 61 year old male, for the past several days he's noted increasing vague chest pain, substernal with occasional radiation to the left arm, this started when he restarted his Imitrex, and went to play some golf. He took the Imitrex, and noted some substernal chest pain described as pressure with exertion, it would go with rest although he would have occasional episodes when laying in bed. He tried a Museum/gallery exhibitions officer which seemed to improve the pain. No nausea, vomiting, hematochezia or hematemesis. No melena. No shortness of breath or palpitations. He does have a history of coronary disease on imaging, he had a stress test last year that was normal but did show some apical questionable artifact, and a chest CT angiogram that showed calcification in the LAD. He's never had a cardiac catheterization. In addition he discontinued his statin sometime ago, and only recently restarted it. He doesn't currently have any chest pain at this moment.  Past medical history:  Negative.  See flowsheet/record as well for more information.  Surgical history: Negative.  See flowsheet/record as well for more information.  Family history: Negative.  See flowsheet/record as well for more information.  Social history: Negative.  See flowsheet/record as well for more information.  Allergies, and medications have been entered into the medical record, reviewed, and no changes needed.   Review of Systems: No fevers, chills, night sweats, weight loss, chest pain, or shortness of breath.   Objective:    General: Well Developed, well nourished, and in no acute distress.  Neuro: Alert and oriented x3, extra-ocular muscles intact, sensation grossly intact.  HEENT: Normocephalic, atraumatic, pupils equal round reactive to light, neck supple, no masses, no lymphadenopathy, thyroid nonpalpable.  Skin: Warm and dry, no rashes. Cardiac: Regular rate and rhythm, no murmurs rubs or  gallops, no lower extremity edema.  Respiratory: Clear to auscultation bilaterally. Not using accessory muscles, speaking in full sentences. Abdomen: Soft, nontender, nondistended, normal bowel sounds, palpable masses, no guarding, rigidity, rebound pain.  ECG personally reviewed, it is normal rate, normal rhythm, normal axis, improved from the previous ECG which looked the same with the exception of occasional premature complexes that appear to be junctional in origin, they do look a bit narrow to be ventricular.  Impression and Recommendations:    Chest pain Checking some blood work including cardiac enzymes, lipids. I do think this is typical chest pain, worse with exertion while walking plan golf, better with rest. Occasional radiation to the left arm. He did have a negative nuclear stress test last year, there was some artifact at the apex, he also had a CT angiogram that showed some calcification in the LAD. Also worsening of chest pain with Imitrex, I have advised him to discontinue this. He also came off of his cholesterol medication for a while, recently restarted. With this constellation of exertional chest pain, calcification in the LAD, worsening of chest pain with an ergo alkaloid, as well as noncompliance with his statin I do think we should proceed with cardiac catheterization, I'm going to do an urgent referral back to cardiology, and give him some nitroglycerin to hold onto. We will also use atenolol to help prevent his migraines, which also offer some cardiovascular protection.  Migraine Due to chest pain and concern for angina with Imitrex we are going to discontinue this and use atenolol in its place to prevent migraines.  I spent 40 minutes with this patient, greater than 50% was face-to-face time  counseling regarding the above diagnoses

## 2016-09-27 ENCOUNTER — Ambulatory Visit (INDEPENDENT_AMBULATORY_CARE_PROVIDER_SITE_OTHER): Payer: BC Managed Care – PPO | Admitting: Internal Medicine

## 2016-09-27 ENCOUNTER — Encounter: Payer: Self-pay | Admitting: Internal Medicine

## 2016-09-27 VITALS — BP 122/80 | HR 64 | Ht 71.0 in | Wt 198.4 lb

## 2016-09-27 DIAGNOSIS — E785 Hyperlipidemia, unspecified: Secondary | ICD-10-CM

## 2016-09-27 DIAGNOSIS — R072 Precordial pain: Secondary | ICD-10-CM

## 2016-09-27 DIAGNOSIS — F17200 Nicotine dependence, unspecified, uncomplicated: Secondary | ICD-10-CM | POA: Diagnosis not present

## 2016-09-27 DIAGNOSIS — I2584 Coronary atherosclerosis due to calcified coronary lesion: Secondary | ICD-10-CM

## 2016-09-27 DIAGNOSIS — I2510 Atherosclerotic heart disease of native coronary artery without angina pectoris: Secondary | ICD-10-CM

## 2016-09-27 DIAGNOSIS — Z9189 Other specified personal risk factors, not elsewhere classified: Secondary | ICD-10-CM | POA: Diagnosis not present

## 2016-09-27 DIAGNOSIS — G43809 Other migraine, not intractable, without status migrainosus: Secondary | ICD-10-CM | POA: Diagnosis not present

## 2016-09-27 LAB — CK TOTAL AND CKMB (NOT AT ARMC)
CK, MB: 2.2 ng/mL (ref 0.0–5.0)
Relative Index: 1.4 (ref 0.0–4.0)
Total CK: 153 U/L (ref 44–196)

## 2016-09-27 LAB — COMPREHENSIVE METABOLIC PANEL
ALT: 24 U/L (ref 9–46)
AST: 21 U/L (ref 10–35)
Albumin: 4.6 g/dL (ref 3.6–5.1)
BUN: 14 mg/dL (ref 7–25)
Calcium: 9.3 mg/dL (ref 8.6–10.3)
Chloride: 106 mmol/L (ref 98–110)
Creat: 0.9 mg/dL (ref 0.70–1.25)
Glucose, Bld: 112 mg/dL — ABNORMAL HIGH (ref 65–99)
Potassium: 4.3 mmol/L (ref 3.5–5.3)
Total Protein: 6.6 g/dL (ref 6.1–8.1)

## 2016-09-27 LAB — LIPID PANEL W/REFLEX DIRECT LDL
Cholesterol: 185 mg/dL (ref <200)
HDL: 47 mg/dL (ref >40)
LDL-Cholesterol: 108 mg/dL — ABNORMAL HIGH
Non-HDL Cholesterol (Calc): 138 mg/dL — ABNORMAL HIGH (ref <130)
Total CHOL/HDL Ratio: 3.9 ratio (ref <5.0)
Triglycerides: 188 mg/dL — ABNORMAL HIGH (ref <150)

## 2016-09-27 LAB — COMPREHENSIVE METABOLIC PANEL WITH GFR
Alkaline Phosphatase: 74 U/L (ref 40–115)
CO2: 25 mmol/L (ref 20–32)
Sodium: 142 mmol/L (ref 135–146)
Total Bilirubin: 0.6 mg/dL (ref 0.2–1.2)

## 2016-09-27 LAB — HEMOGLOBIN A1C
Hgb A1c MFr Bld: 5.9 % — ABNORMAL HIGH (ref <5.7)
Mean Plasma Glucose: 123 mg/dL

## 2016-09-27 LAB — VITAMIN D 25 HYDROXY (VIT D DEFICIENCY, FRACTURES): Vit D, 25-Hydroxy: 33 ng/mL (ref 30–100)

## 2016-09-27 LAB — TSH: TSH: 1.07 m[IU]/L (ref 0.40–4.50)

## 2016-09-27 LAB — TROPONIN I: Troponin I: <0.01 ng/mL (ref <=0.05)

## 2016-09-27 LAB — HIV ANTIBODY (ROUTINE TESTING W REFLEX): HIV 1&2 Ab, 4th Generation: NONREACTIVE

## 2016-09-27 LAB — HEPATITIS C ANTIBODY: HCV Ab: NONREACTIVE

## 2016-09-27 MED ORDER — METOPROLOL SUCCINATE ER 25 MG PO TB24: 25.0000 mg | ORAL_TABLET | Freq: Every day | ORAL | 3 refills | Status: DC

## 2016-09-27 NOTE — Patient Instructions (Signed)
Dr Rennis GoldenHilty recommends that you have a coronary CT angiogram. [facility]. Cardiac computed tomography (CT) is a painless test that uses an x-ray machine to take clear, detailed pictures of your heart. For further information please visit [email or web address]   Your physician recommends that you schedule a follow-up appointment after testing is completed.

## 2016-09-29 DIAGNOSIS — Z87891 Personal history of nicotine dependence: Secondary | ICD-10-CM | POA: Insufficient documentation

## 2016-09-29 DIAGNOSIS — E785 Hyperlipidemia, unspecified: Secondary | ICD-10-CM | POA: Insufficient documentation

## 2016-09-29 DIAGNOSIS — I2510 Atherosclerotic heart disease of native coronary artery without angina pectoris: Secondary | ICD-10-CM | POA: Insufficient documentation

## 2016-09-29 DIAGNOSIS — I2584 Coronary atherosclerosis due to calcified coronary lesion: Secondary | ICD-10-CM

## 2016-09-29 NOTE — Progress Notes (Signed)
OFFICE CONSULT NOTE  Chief Complaint:  Chest pain  Primary Care Physician: Joseph Jacobs, Joseph J, MD  HPI:  Joseph Jacobs is a 61 y.o. male who is being seen today for the evaluation of chest pain at the request of Joseph Jacobs, Joseph Jacobs,*. Joseph Jacobs was referred for evaluation of chest and arm pain. He has a history of asthma and migraine headaches. 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 He is a quarter pack per day smoker 30 years and is [place]. His only exercise is playing golf several times a week but he still manages to score in the 70s. Recently he's had some chest pressure. He says it comes on with or without exertion and is relieved by rest. Sometimes he has radiating symptoms down the left arm, associated with some numbness and tingling. There is a family history of diabetes but no coronary artery disease. Both parents had cancer. He denies any worsening shortness of breath with exertion. Recent labs indicate an LDL of 104. He also had a CT angiogram of the chest to rule out a pulmonary embolism in June 2017 after similar symptoms of chest pain. This demonstrated calcification in the LAD. He was seen by Joseph JewsKatie Thompson, PA-C, who ordered a Lexiscan Myoview on 07/19/2015 which showed normal LV function and no ischemia.  PMHx:  Past Medical History:  Diagnosis Date  . Asthma   . Lumbar degenerative disc disease 12/14/2013  . Migraine 12/14/2013  . Stress fracture of foot 12/14/2013    Past Surgical History:  Procedure Laterality Date  . BILATERAL CARPAL TUNNEL RELEASE    . KNEE SURGERY      FAMHx:  Family History  Problem Relation Age of Onset  . Alcoholism Mother   . Cancer Unknown   . Diabetes Mother        father     SOCHx:   reports that he has been smoking Cigarettes.  He has a 10.00 pack-year smoking history. He has  never used smokeless tobacco. He reports that he drinks about 1.8 oz of alcohol per week . He reports that he does not use drugs.  ALLERGIES:  Allergies  Allergen Reactions  . E-Mycin [Erythromycin] Other (See Comments)    GI upset     ROS: Pertinent items noted in HPI and remainder of comprehensive ROS otherwise negative.  HOME MEDS: Current Outpatient Prescriptions on File Prior to Visit  Medication Sig Dispense Refill  . albuterol (PROVENTIL HFA;VENTOLIN HFA) 108 (90 Base) MCG/ACT inhaler Inhale two puffs every 4-6 hours only as needed for shortness of breath or wheezing. 1 Inhaler 3  . albuterol (PROVENTIL) (2.5 MG/3ML) 0.083% nebulizer solution Take 3 mLs (2.5 mg total) by nebulization every 6 (six) hours as needed for wheezing or shortness of breath. 75 mL 12  . aspirin EC 81 MG tablet Take 1 tablet (81 mg total) by mouth daily.    Marland Kitchen. atorvastatin (LIPITOR) 40 MG tablet Take 1 tablet (40 mg total) by mouth at bedtime. 30 tablet 0  . cyclobenzaprine (FLEXERIL) 10 MG tablet One half tab PO qHS, then increase gradually to one tab TID. 30 tablet 0  . meloxicam (MOBIC) 15 MG tablet Take 1 tablet (15 mg total) by mouth daily. Take with a meal. 90 tablet 3  . montelukast (SINGULAIR) 10 MG tablet Take 1 tablet (10 mg total) by mouth  at bedtime. 90 tablet 3  . nitroGLYCERIN (NITROSTAT) 0.4 MG SL tablet Place 1 tablet (0.4 mg total) under the tongue every 5 (five) minutes as needed for chest pain (or tightness). 30 tablet 0   No current facility-administered medications on file prior to visit.     LABS/IMAGING: No results found for this or any previous visit (from the past 48 hour(s)). No results found.  LIPID PANEL:    Component Value Date/Time   CHOL 185 09/26/2016 1149   TRIG 188 (H) 09/26/2016 1149   HDL 47 09/26/2016 1149   CHOLHDL 3.9 09/26/2016 1149   VLDL 36 (H) 07/06/2015 0939   LDLCALC 104 07/06/2015 0939    WEIGHTS: Wt Readings from Last 3 Encounters:  09/27/16 198  lb 6.4 oz (90 kg)  09/26/16 199 lb (90.3 kg)  09/03/16 205 lb (93 kg)    VITALS: BP 122/80   Pulse 64   Ht 5\' 11"  (1.803 m)   Wt 198 lb 6.4 oz (90 kg)   BMI 27.67 kg/m   EXAM: General appearance: alert and no distress Neck: no carotid bruit, no JVD and thyroid not enlarged, symmetric, no tenderness/mass/nodules Lungs: clear to auscultation bilaterally Heart: regular rate and rhythm, S1, S2 normal, no murmur, click, rub or gallop Abdomen: soft, non-tender; bowel sounds normal; no masses,  no organomegaly Extremities: extremities normal, atraumatic, no cyanosis or edema Pulses: 2+ and symmetric Skin: Skin color, texture, turgor normal. No rashes or lesions Neurologic: Grossly normal Psych: Pleasant  EKG: Sinus rhythm at 64 - personally reviewed  ASSESSMENT: 1. Exertional chest pain-possibly unstable angina 2. Tobacco abuse 3. Calcification of the LAD  PLAN: 1.   [name] has had progressive chest discomfort. He is known to have LAD calcification and had a nuclear stress test in 2017 which was negative for ischemia. Recently his symptoms of worsened. Although they have some typical anginal features, others are somewhat atypical and could be related to his cervical spine disease. Given the fact that he has known CAD with coronary artery calcium, I would recommend a coronary artery CT angiogram plus or minus FFR to evaluate for any significant stenoses. Plan follow-up with me afterwards. If this study is abnormal then he may need left heart catheterization.  Thanks for the kind referral.  Joseph Nose, MD, [facility]  [place]  [facility]  Attending Cardiologist  Direct Dial: [phone]  Fax: [phone]  Website:  [email or web address] Nicely Joseph Jacobs 09/29/2016, 3:58 PM

## 2016-10-04 ENCOUNTER — Telehealth: Payer: Self-pay | Admitting: Internal Medicine

## 2016-10-04 NOTE — Telephone Encounter (Signed)
New message    Pt is calling about his CT. Please call pt.

## 2016-10-04 NOTE — Telephone Encounter (Signed)
Returned call to patient, states he called his insurance company and they stated they have not received anything in regards to his CT that was ordered.  Advised I would follow up with precert and let him know.  Patient verbalized understanding.

## 2016-10-04 NOTE — Addendum Note (Signed)
Addended by: Beryle QuantBENSHINE, TARA L on: 10/04/2016 08:21 AM   Modules accepted: Orders

## 2016-10-08 NOTE — Telephone Encounter (Signed)
Follow up      Pt is calling about CT scan. Please call.

## 2016-10-08 NOTE — Telephone Encounter (Signed)
Returned.

## 2016-10-08 NOTE — Telephone Encounter (Signed)
Returned call to patient he stated he called his insurance and cardiac ct has not been precerted.Advised I will send a message to pre cert pool and let them know.Stated he would like to have scheduled soon.I will also send a message to scheduling pool and let them know.

## 2016-10-09 ENCOUNTER — Encounter: Payer: Self-pay | Admitting: Internal Medicine

## 2016-10-10 ENCOUNTER — Telehealth: Payer: Self-pay | Admitting: Sports Medicine

## 2016-10-10 ENCOUNTER — Inpatient Hospital Stay (HOSPITAL_COMMUNITY)
Admission: EM | Admit: 2016-10-10 | Discharge: 2016-10-12 | DRG: 247 | Disposition: A | Discharge status: Home / Self Care | Payer: BC Managed Care – PPO | Attending: Internal Medicine | Admitting: Internal Medicine

## 2016-10-10 ENCOUNTER — Telehealth: Payer: Self-pay | Admitting: Internal Medicine

## 2016-10-10 ENCOUNTER — Emergency Department (HOSPITAL_COMMUNITY): Payer: BC Managed Care – PPO

## 2016-10-10 ENCOUNTER — Encounter (HOSPITAL_COMMUNITY): Payer: Self-pay | Admitting: Emergency Medicine

## 2016-10-10 DIAGNOSIS — M5136 Other intervertebral disc degeneration, lumbar region: Secondary | ICD-10-CM | POA: Diagnosis present

## 2016-10-10 DIAGNOSIS — R072 Precordial pain: Secondary | ICD-10-CM

## 2016-10-10 DIAGNOSIS — F17210 Nicotine dependence, cigarettes, uncomplicated: Secondary | ICD-10-CM | POA: Diagnosis present

## 2016-10-10 DIAGNOSIS — R748 Abnormal levels of other serum enzymes: Secondary | ICD-10-CM | POA: Diagnosis not present

## 2016-10-10 DIAGNOSIS — R778 Other specified abnormalities of plasma proteins: Secondary | ICD-10-CM | POA: Diagnosis present

## 2016-10-10 DIAGNOSIS — I200 Unstable angina: Secondary | ICD-10-CM | POA: Diagnosis present

## 2016-10-10 DIAGNOSIS — I25110 Atherosclerotic heart disease of native coronary artery with unstable angina pectoris: Secondary | ICD-10-CM | POA: Diagnosis not present

## 2016-10-10 DIAGNOSIS — I5030 Unspecified diastolic (congestive) heart failure: Secondary | ICD-10-CM | POA: Diagnosis present

## 2016-10-10 DIAGNOSIS — R079 Chest pain, unspecified: Secondary | ICD-10-CM | POA: Diagnosis not present

## 2016-10-10 DIAGNOSIS — Z79899 Other long term (current) drug therapy: Secondary | ICD-10-CM

## 2016-10-10 DIAGNOSIS — E785 Hyperlipidemia, unspecified: Secondary | ICD-10-CM | POA: Diagnosis not present

## 2016-10-10 DIAGNOSIS — I110 Hypertensive heart disease with heart failure: Secondary | ICD-10-CM | POA: Diagnosis present

## 2016-10-10 DIAGNOSIS — R7989 Other specified abnormal findings of blood chemistry: Secondary | ICD-10-CM | POA: Diagnosis present

## 2016-10-10 DIAGNOSIS — G43909 Migraine, unspecified, not intractable, without status migrainosus: Secondary | ICD-10-CM | POA: Diagnosis present

## 2016-10-10 DIAGNOSIS — J45909 Unspecified asthma, uncomplicated: Secondary | ICD-10-CM | POA: Diagnosis present

## 2016-10-10 DIAGNOSIS — Z720 Tobacco use: Secondary | ICD-10-CM | POA: Diagnosis present

## 2016-10-10 DIAGNOSIS — Z7982 Long term (current) use of aspirin: Secondary | ICD-10-CM

## 2016-10-10 DIAGNOSIS — Z881 Allergy status to other antibiotic agents status: Secondary | ICD-10-CM

## 2016-10-10 DIAGNOSIS — Z955 Presence of coronary angioplasty implant and graft: Secondary | ICD-10-CM

## 2016-10-10 HISTORY — DX: Pneumonia, unspecified organism: J18.9

## 2016-10-10 HISTORY — DX: Angina pectoris, unspecified: I20.9

## 2016-10-10 HISTORY — DX: Pure hypercholesterolemia, unspecified: E78.00

## 2016-10-10 HISTORY — DX: Atherosclerotic heart disease of native coronary artery without angina pectoris: I25.10

## 2016-10-10 HISTORY — DX: Unspecified chronic bronchitis: J42

## 2016-10-10 HISTORY — DX: Unspecified osteoarthritis, unspecified site: M19.90

## 2016-10-10 HISTORY — DX: Sleep apnea, unspecified: G47.30

## 2016-10-10 HISTORY — DX: Low back pain, unspecified: M54.50

## 2016-10-10 HISTORY — DX: Low back pain: M54.5

## 2016-10-10 HISTORY — DX: Other chronic pain: G89.29

## 2016-10-10 LAB — CBC
HCT: 44.3 % (ref 39.0–52.0)
Hemoglobin: 15 g/dL (ref 13.0–17.0)
MCH: 31.3 pg (ref 26.0–34.0)
MCHC: 33.9 g/dL (ref 30.0–36.0)
MCV: 92.3 fL (ref 78.0–100.0)
Platelets: 271 K/uL (ref 150–400)
RBC: 4.8 MIL/uL (ref 4.22–5.81)
RDW: 12.5 % (ref 11.5–15.5)
WBC: 7.3 K/uL (ref 4.0–10.5)

## 2016-10-10 LAB — BASIC METABOLIC PANEL WITH GFR
Anion gap: 9 (ref 5–15)
BUN: 10 mg/dL (ref 6–20)
CO2: 22 mmol/L (ref 22–32)
Calcium: 9.3 mg/dL (ref 8.9–10.3)
Chloride: 108 mmol/L (ref 101–111)
Creatinine, Ser: 0.85 mg/dL (ref 0.61–1.24)
GFR calc Af Amer: >60 mL/min
GFR calc non Af Amer: >60 mL/min
Glucose, Bld: 158 mg/dL — ABNORMAL HIGH (ref 65–99)
Potassium: 3.6 mmol/L (ref 3.5–5.1)
Sodium: 139 mmol/L (ref 135–145)

## 2016-10-10 LAB — I-STAT TROPONIN, ED
TROPONIN I, POC: 0.01 ng/mL (ref 0.00–0.08)
Troponin i, poc: 0.07 ng/mL (ref 0.00–0.08)

## 2016-10-10 MED ORDER — ASPIRIN 81 MG PO CHEW
324.0000 mg | CHEWABLE_TABLET | Freq: Once | ORAL | Status: AC
  Administered 2016-10-10: 324 mg via ORAL
  Filled 2016-10-10: qty 4

## 2016-10-10 NOTE — Telephone Encounter (Signed)
S/w pt he states that his angina has gotten worse since being seen and would like to move up is scheduled Coronary CT, scheduling is unable to move up appt. Pt notified to go to the ER. Pt states that he would like to move up his CT and not go to the ER. Since this not an option he understands that the only other option but to go to the ER, and there is no way of telling him that they will deem it medically necessary to do this before this is scheduled on Monday, since this is up to the ER MD that is evaluating this.

## 2016-10-10 NOTE — H&P (Signed)
Joseph Jacobs [id number] DOB: 10/21/1955 DOA: 10/10/2016     PCP: Monica Bectonhekkekandam, Thomas J, MD  [facility] Outpatient Specialists: Cardiology [facility]  Patient coming from: home Lives With family    Chief Complaint: Chest pain  HPI: Joseph RocherChristopher Hun is a 61 y.o. male with medical history significant of asthma and migraine headaches.    Presented with what initially was exertional chest pain relieved left arm relieved by rest he was evaluated by this by cardiology 2 weeks ago and the plan was for him to undergo cardiac CT. Started to develop some pain at rest and presented to emergency department Last LDL 104 he had discontinued his statin and only restarted recently.  Reports that his chest pain has been getting worse with Imitrex.  Lexiscan Myoview on 07/19/2015 which showed normal LV function and no ischemia. CT angiogram of the chest in 2017 which showed calcification of the LAD    Regarding pertinent Chronic problems: History of migraine headaches until recently has been on Imitrex for prophylaxis currently is changed to atenolol   IN ER:  Temp (24hrs), Avg:98.2 F (36.8 C), Min:98 F (36.7 C), Max:98.4 F (36.9 C)      on arrival  ED Triage Vitals  Enc Vitals Group     BP 10/10/16 1810 (!) 151/88     Pulse Rate 10/10/16 1810 78     Resp 10/10/16 1810 12     Temp 10/10/16 1810 98 F (36.7 C)     Temp Source 10/10/16 1810 Oral     SpO2 10/10/16 1810 97 %     Weight --      Height --      Head Circumference --      Peak Flow --      Pain Score 10/10/16 1809 4     Pain Loc --      Pain Edu? --      Excl. in GC? --     Latest RR 14, 98% HR 69 BP 126/80 Troponin 0.01> 0.07 Na 139 K3.6 Cr 0.85 WBC 7.3 Hg 15  Following Medications were ordered in ER: Medications  aspirin chewable tablet 324 mg (324 mg Oral Given 10/10/16 2244)     ER provider discussed case with:  Cardiology Who recommends: Admission to cycle cardiac enzymes keep nothing by mouth post  midnight for possible stress test versus cardiac catheterization  We'll see patient in consult in the morning   Hospitalist was called for admission for chest pain   Review of Systems:    Pertinent positives include: chest pain,   Constitutional:  No weight loss, night sweats, Fevers, chills, fatigue, weight loss  HEENT:  No headaches, Difficulty swallowing,Tooth/dental problems,Sore throat,  No sneezing, itching, ear ache, nasal congestion, post nasal drip,  Cardio-vascular:  No Orthopnea, PND, anasarca, dizziness, palpitations.no Bilateral lower extremity swelling  GI:  No heartburn, indigestion, abdominal pain, nausea, vomiting, diarrhea, change in bowel habits, loss of appetite, melena, blood in stool, hematemesis Resp:  no shortness of breath at rest. No dyspnea on exertion, No excess mucus, no productive cough, No non-productive cough, No coughing up of blood.No change in color of mucus.No wheezing. Skin:  no rash or lesions. No jaundice GU:  no dysuria, change in color of urine, no urgency or frequency. No straining to urinate.  No flank pain.  Musculoskeletal:  No joint pain or no joint swelling. No decreased range of motion. No back pain.  Psych:  No change in mood or affect. No depression  or anxiety. No memory loss.  Neuro: no localizing neurological complaints, no tingling, no weakness, no double vision, no gait abnormality, no slurred speech, no confusion  As per HPI otherwise 10 point review of systems negative.   Past Medical History: Past Medical History:  Diagnosis Date  . Asthma   . Lumbar degenerative disc disease 12/14/2013  . Migraine 12/14/2013  . Stress fracture of foot 12/14/2013   Past Surgical History:  Procedure Laterality Date  . BILATERAL CARPAL TUNNEL RELEASE    . KNEE SURGERY       Social History:  Ambulatory   independently     reports that he has been smoking Cigarettes.  He has a 10.00 pack-year smoking history. He has never used  smokeless tobacco. He reports that he drinks about 1.8 oz of alcohol per week . He reports that he does not use drugs.  Allergies:   Allergies  Allergen Reactions  . E-Mycin [Erythromycin] Other (See Comments)    GI upset        Family History:   Family History  Problem Relation Age of Onset  . Alcoholism Mother   . Diabetes Mother        father   . Cancer Unknown     Medications: Prior to Admission medications   Medication Sig Start Date End Date Taking? Authorizing Provider  albuterol (PROVENTIL HFA;VENTOLIN HFA) 108 (90 Base) MCG/ACT inhaler Inhale two puffs every 4-6 hours only as needed for shortness of breath or wheezing. 01/19/16 01/18/17  Monica Becton, MD  albuterol (PROVENTIL) (2.5 MG/3ML) 0.083% nebulizer solution Take 3 mLs (2.5 mg total) by nebulization every 6 (six) hours as needed for wheezing or shortness of breath. 04/24/16   Monica Becton, MD  aspirin EC 81 MG tablet Take 1 tablet (81 mg total) by mouth daily. 07/06/15   Hommel, Gregary Signs, DO  atorvastatin (LIPITOR) 40 MG tablet Take 1 tablet (40 mg total) by mouth at bedtime. 09/03/16   Monica Becton, MD  cyclobenzaprine (FLEXERIL) 10 MG tablet One half tab PO qHS, then increase gradually to one tab TID. 09/03/16   Monica Becton, MD  meloxicam (MOBIC) 15 MG tablet Take 1 tablet (15 mg total) by mouth daily. Take with a meal. 06/28/16   Monica Becton, MD  metoprolol succinate (TOPROL XL) 25 MG 24 hr tablet Take 1 tablet (25 mg total) by mouth daily. 09/27/16   Hilty, Lisette Abu, MD  montelukast (SINGULAIR) 10 MG tablet Take 1 tablet (10 mg total) by mouth at bedtime. 03/08/16   Monica Becton, MD  nitroGLYCERIN (NITROSTAT) 0.4 MG SL tablet Place 1 tablet (0.4 mg total) under the tongue every 5 (five) minutes as needed for chest pain (or tightness). 09/26/16   Monica Becton, MD    Physical Exam: Patient Vitals for the past 24 hrs:  BP Temp Temp src Pulse Resp SpO2    10/10/16 2245 126/80 - - 69 14 98 %  10/10/16 2057 125/78 98.4 F (36.9 C) Oral 70 17 98 %  10/10/16 1810 (!) 151/88 98 F (36.7 C) Oral 78 12 97 %    1. General:  in No Acute distress  well  -appearing 2. Psychological: Alert and   Oriented 3. Head/ENT:   Moist  Mucous Membranes                          Head Non traumatic, neck supple  Poor Dentition 4. SKIN:  decreased Skin turgor,  Skin clean Dry and intact no rash 5. Heart: Regular rate and rhythm no Murmur, no Rub or gallop 6. Lungs: Clear to auscultation bilaterally, no wheezes or crackles   7. Abdomen: Soft,  non-tender, Non distended  bowel sounds present 8. Lower extremities: no clubbing, cyanosis, or edema 9. Neurologically Grossly intact, moving all 4 extremities equally   10. MSK: Normal range of motion   body mass index is unknown because there is no height or weight on file.  Labs on Admission:   Labs on Admission: I have personally reviewed following labs and imaging studies  CBC:  Recent Labs Lab 10/10/16 1825  WBC 7.3  HGB 15.0  HCT 44.3  MCV 92.3  PLT 271   Basic Metabolic Panel:  Recent Labs Lab 10/10/16 1825  NA 139  K 3.6  CL 108  CO2 22  GLUCOSE 158*  BUN 10  CREATININE 0.85  CALCIUM 9.3   GFR: Estimated Creatinine Clearance: 98.4 mL/min (by C-G formula based on SCr of 0.85 mg/dL). Liver Function Tests: No results for input(s): AST, ALT, ALKPHOS, BILITOT, PROT, ALBUMIN in the last 168 hours. No results for input(s): LIPASE, AMYLASE in the last 168 hours. No results for input(s): AMMONIA in the last 168 hours. Coagulation Profile: No results for input(s): INR, PROTIME in the last 168 hours. Cardiac Enzymes: No results for input(s): CKTOTAL, CKMB, CKMBINDEX, TROPONINI in the last 168 hours. BNP (last 3 results) No results for input(s): PROBNP in the last 8760 hours. HbA1C: No results for input(s): HGBA1C in the last 72 hours. CBG: No results for  input(s): GLUCAP in the last 168 hours. Lipid Profile: No results for input(s): CHOL, HDL, LDLCALC, TRIG, CHOLHDL, LDLDIRECT in the last 72 hours. Thyroid Function Tests: No results for input(s): TSH, T4TOTAL, FREET4, T3FREE, THYROIDAB in the last 72 hours. Anemia Panel: No results for input(s): VITAMINB12, FOLATE, FERRITIN, TIBC, IRON, RETICCTPCT in the last 72 hours. Urine analysis: No results found for: COLORURINE, APPEARANCEUR, LABSPEC, PHURINE, GLUCOSEU, HGBUR, BILIRUBINUR, KETONESUR, PROTEINUR, UROBILINOGEN, NITRITE, LEUKOCYTESUR Sepsis Labs: (procalcitonin:4,lacticidven:4) )No results found for this or any previous visit (from the past 240 hour(s)).     UA  not ordered  Lab Results  Component Value Date   HGBA1C 5.9 (H) 09/26/2016    Estimated Creatinine Clearance: 98.4 mL/min (by C-G formula based on SCr of 0.85 mg/dL).  BNP (last 3 results) No results for input(s): PROBNP in the last 8760 hours.   ECG REPORT  Independently reviewed Rate: 74  Rhythm: NSR ST&T Change: No acute ischemic changes   QTC 424  There were no vitals filed for this visit.   Cultures: No results found for: SDES, SPECREQUEST, CULT, REPTSTATUS   Radiological Exams on Admission: No results found.  Chart has been reviewed    Assessment/Plan   61 y.o. male with medical history significant of asthma and migraine headaches.     Admitted for Chest pain evaluation  Present on Admission: . Chest pain - given change in nature with occurrence at rest worrisome for possibly unstable angina appreciate cardiology input. May need cardiac catheterization in a.m. versus stress test. Continue to cycle cardiac enzymes check echo keep nothing by mouth. Start IV heparin . Dyslipidemia - Stable continue home medications checked. Perineum . Tobacco abuseTobacco cessation protocol spoke about importance of quitting . Elevated troponin - Slightly elevated but trending up no chest pain currently  given possibility of unstable angina would initiate heparin  Other plan as per orders.  DVT prophylaxis:   Heparin Code Status:  FULL CODE as per patient    Family Communication:   Family  at  Bedside  plan of care was discussed with   Wife  Disposition Plan:                                 Consults called: Cardiology aware   Admission status:  obs   Level of care     tele        I have spent a total of 56 min on this admission   Ghislaine Harcum 10/11/2016, 12:32 AM    [facility]  Pager [phone]   after 2 AM please page floor coverage PA If 7AM-7PM, please contact the day team taking care of the patient  [email or web address]  Password [email or web address]

## 2016-10-10 NOTE — ED Triage Notes (Signed)
Pt reports chest pain for over 1 week, has been taking nitro at home, had 1 today that he states eased his pain. Pt denies sob, states he has a ct angio ordered for Monday. Pt a/ox4 resp e/u.

## 2016-10-10 NOTE — ED Provider Notes (Signed)
[facility]-EMERGENCY DEPT Provider Note   CSN: [id number] Arrival date & time: 10/10/16  1803     History   Chief Complaint Chief Complaint  Patient presents with  . Chest Pain    HPI Justice RocherChristopher Tolsma is a 61 y.o. male.  HPI   [name] is a 61yo male with a history of HTN, tobacco use (10 pk/yrs), lumbar DDD, asthma, migraines who presents to the Emergency Department with episodes of chest pain at rest which are relieved by nitroglycerin. Patient states he was seen by cardiology about two weeks ago for exertional chest pain. Cardiology scheduled a CT coronary study which is scheduled 5 days from now. Patient states that he is concerned because over the last 24 hours he has had several episodes of 10/10 sudden onset chest pain at rest which radiates to his left arm. There is associated diaphoresis with these episodes, denies SOB, denies N/V. Nitroglycerin improves his pain. His last episode of chest pain was around 4pm this afternoon. He currently denies pain. Patient reports mother with an MI at age 60, no other family history of MI.  He had a CT angio in the ER last year which showed calcification of his LAD. He had a stress test 06/2015 which showed normal LV function and no ischemia.   Past Medical History:  Diagnosis Date  . Asthma   . Lumbar degenerative disc disease 12/14/2013  . Migraine 12/14/2013  . Stress fracture of foot 12/14/2013    Patient Active Problem List   Diagnosis Date Noted  . Chest pain 10/10/2016  . Tobacco abuse 10/10/2016  . Coronary artery calcification 09/29/2016  . Dyslipidemia 09/29/2016  . Smoker 09/29/2016  . Precordial chest pain 09/26/2016  . Annual physical exam 09/03/2016  . Metatarsalgia of right foot 06/28/2016  . Pain of right sacroiliac joint 03/08/2016  . Primary osteoarthritis of right hip 05/30/2015  . Vitamin D deficiency 12/13/2014  . Memory loss 12/13/2014  . Osteoarthritis of right glenohumeral joint 01/25/2014  . Migraine  12/14/2013  . Lumbar degenerative disc disease 12/14/2013  . Primary osteoarthritis of right foot 12/14/2013  . Seasonal allergies 12/14/2013  . Disc disorder of cervical region 08/11/2008    Past Surgical History:  Procedure Laterality Date  . BILATERAL CARPAL TUNNEL RELEASE    . KNEE SURGERY         Home Medications    Prior to Admission medications   Medication Sig Start Date End Date Taking? Authorizing Provider  albuterol (PROVENTIL HFA;VENTOLIN HFA) 108 (90 Base) MCG/ACT inhaler Inhale two puffs every 4-6 hours only as needed for shortness of breath or wheezing. Patient taking differently: Inhale 2 puffs into the lungs See admin instructions. Every 4-6 hours only as needed for shortness of breath or wheezing 01/19/16 01/18/17 Yes Monica Bectonhekkekandam, Thomas J, MD  albuterol (PROVENTIL) (2.5 MG/3ML) 0.083% nebulizer solution Take 3 mLs (2.5 mg total) by nebulization every 6 (six) hours as needed for wheezing or shortness of breath. 04/24/16  Yes Monica Bectonhekkekandam, Thomas J, MD  aspirin EC 81 MG tablet Take 1 tablet (81 mg total) by mouth daily. Patient taking differently: Take 81 mg by mouth every evening.  07/06/15  Yes Hommel, Sean, DO  atorvastatin (LIPITOR) 40 MG tablet Take 1 tablet (40 mg total) by mouth at bedtime. 09/03/16  Yes Monica Bectonhekkekandam, Thomas J, MD  ibuprofen (ADVIL,MOTRIN) 200 MG tablet Take 200-400 mg by mouth every 6 (six) hours as needed (for pain or headaches).   Yes [provider]  metoprolol succinate (  TOPROL XL) 25 MG 24 hr tablet Take 1 tablet (25 mg total) by mouth daily. 09/27/16  Yes Hilty, Lisette Abu, MD  montelukast (SINGULAIR) 10 MG tablet Take 1 tablet (10 mg total) by mouth at bedtime. 03/08/16  Yes Monica Becton, MD  naproxen sodium (ALEVE) 220 MG tablet Take 220-440 mg by mouth 2 (two) times daily as needed (for pain or headaches).   Yes [provider]  nitroGLYCERIN (NITROSTAT) 0.4 MG SL tablet Place 1 tablet (0.4 mg total) under the tongue  every 5 (five) minutes as needed for chest pain (or tightness). 09/26/16  Yes Monica Becton, MD  cyclobenzaprine (FLEXERIL) 10 MG tablet One half tab PO qHS, then increase gradually to one tab TID. Patient not taking: Reported on 10/10/2016 09/03/16   Monica Becton, MD  meloxicam (MOBIC) 15 MG tablet Take 1 tablet (15 mg total) by mouth daily. Take with a meal. 06/28/16   Monica Becton, MD    Family History Family History  Problem Relation Age of Onset  . Alcoholism Mother   . Diabetes Mother        father   . Cancer Unknown     Social History Social History  Substance Use Topics  . Smoking status: Current Every Day Smoker    Packs/day: 0.25    Years: 40.00    Types: Cigarettes  . Smokeless tobacco: Never Used  . Alcohol use 1.8 oz/week    3 Standard drinks or equivalent per week     Allergies   E-mycin [erythromycin]   Review of Systems Review of Systems  Constitutional: Positive for diaphoresis (with episodes of chest pain). Negative for chills, fatigue and fever.  HENT: Negative for sore throat.   Eyes: Negative for visual disturbance.  Respiratory: Negative for cough, shortness of breath and wheezing.   Cardiovascular: Positive for chest pain. Negative for palpitations and leg swelling.  Gastrointestinal: Negative for abdominal pain, diarrhea, nausea and vomiting.  Genitourinary: Negative for difficulty urinating and dysuria.  Musculoskeletal: Negative for back pain and neck stiffness.  Skin: Negative for rash and wound.  Neurological: Positive for headaches (after taking nitroglycerin).  Psychiatric/Behavioral: Negative for agitation.     Physical Exam Updated Vital Signs BP 126/80   Pulse 69   Temp 98.4 F (36.9 C) (Oral)   Resp 14   SpO2 98%   Physical Exam  Constitutional: He is oriented to person, place, and time. He appears well-developed and well-nourished. No distress.  HENT:  Head: Normocephalic and atraumatic.    Mouth/Throat: Oropharynx is clear and moist. No oropharyngeal exudate.  Eyes: Pupils are equal, round, and reactive to light. Conjunctivae are normal. Right eye exhibits no discharge. Left eye exhibits no discharge. No scleral icterus.  Neck: Normal range of motion. Neck supple.  Cardiovascular: Normal rate, regular rhythm and intact distal pulses.  Exam reveals no gallop and no friction rub.   No murmur heard. Pulmonary/Chest: Effort normal and breath sounds normal. No respiratory distress. He has no wheezes. He has no rales. He exhibits no tenderness.  Abdominal: Soft. Bowel sounds are normal. There is no tenderness. There is no guarding.  Musculoskeletal: Normal range of motion.  Neurological: He is alert and oriented to person, place, and time. Coordination normal.  Skin: Skin is warm and dry. Capillary refill takes less than 2 seconds. He is not diaphoretic.  Psychiatric: He has a normal mood and affect. His behavior is normal.  Nursing note and vitals reviewed.  ED Treatments / Results  Labs (all labs ordered are listed, but only abnormal results are displayed) Labs Reviewed  BASIC METABOLIC PANEL - Abnormal; Notable for the following:       Result Value   Glucose, Bld 158 (*)    All other components within normal limits  CBC  TROPONIN I  TROPONIN I  TROPONIN I  I-STAT TROPONIN, ED  I-STAT TROPONIN, ED    EKG  EKG Interpretation  Date/Time:  Wednesday October 10 2016 18:08:18 EDT Ventricular Rate:  74 PR Interval:  156 QRS Duration: 88 QT Interval:  382 QTC Calculation: 424 R Axis:   -18 Text Interpretation:  Normal sinus rhythm Normal ECG No STEMI.  Confirmed by Alona Bene [phone]) on 10/10/2016 9:31:01 PM       Radiology No results found.  Procedures Procedures (including critical care time)  Medications Ordered in ED Medications  aspirin chewable tablet 324 mg (324 mg Oral Given 10/10/16 2244)     Initial Impression / Assessment and Plan / ED  Course  I have reviewed the triage vital signs and the nursing notes.  Pertinent labs & imaging results that were available during my care of the patient were reviewed by me and considered in my medical decision making (see chart for details).    Concern for unstable angina given patient is having chest pain at rest which radiates to his left arm and is relieved with nitroglycerin. He is already being followed by a cardiologist. Discussed this patient with Dr. Jacqulyn Bath who also saw the patient and suggests getting cardiology involved. Do not suspect dissection at this time as patient has no neurological symptoms, radial pulses 2+ bilaterally. Do not suspect PE as patient denies SOB, no tachycardia, no tachypnea, no cancer history.  Labs reviewed. First troponin negative, second troponin 0.07. EKG without evidence of ischemia. CBC within normal limits.  Cardiology spoke with Dr. Jacqulyn Bath and recommend admission for stress test tomorrow. Hospitalist service agrees to take patient.   Final Clinical Impressions(s) / ED Diagnoses   Final diagnoses:  Precordial chest pain    New Prescriptions New Prescriptions   No medications on file     Lawrence Marseilles 10/11/16 0006    LongArlyss Repress, MD 10/11/16 1245

## 2016-10-10 NOTE — ED Notes (Addendum)
Pt states he was told he needed a special CT if he has another episode of cp or at l least a longer acting nitro, pt has CT  Appointment on Monday, pt  Refused  Chest xray

## 2016-10-10 NOTE — Telephone Encounter (Signed)
Patient called req to know if you can call him. Adv pt today is your 1/2 day and I would send a message. Would not say what it was in reference too. [phone] Thanks

## 2016-10-10 NOTE — Telephone Encounter (Signed)
New message       Pt c/o of Chest Pain: STAT if CP now or developed within 24 hours  1. Are you having CP right now?  Pt states he is having angina pain 2. Are you experiencing any other symptoms (ex. SOB, nausea, vomiting, sweating)?  Sob and left arm ache 3. How long have you been experiencing CP?   4. Is your CP continuous or coming and going?   5. Have you taken Nitroglycerin? Took nitro at 2am Pt is scheduled to have a test on Monday.  He want to see if he can get it this week?

## 2016-10-11 ENCOUNTER — Other Ambulatory Visit (HOSPITAL_COMMUNITY): Payer: BC Managed Care – PPO

## 2016-10-11 ENCOUNTER — Other Ambulatory Visit: Payer: Self-pay

## 2016-10-11 ENCOUNTER — Encounter (HOSPITAL_COMMUNITY): Payer: Self-pay | Admitting: Internal Medicine

## 2016-10-11 ENCOUNTER — Observation Stay (HOSPITAL_COMMUNITY): Payer: BC Managed Care – PPO

## 2016-10-11 ENCOUNTER — Encounter (HOSPITAL_COMMUNITY)
Admission: EM | Disposition: A | Discharge status: Home / Self Care | Payer: Self-pay | Source: Home / Self Care | Attending: Internal Medicine

## 2016-10-11 ENCOUNTER — Observation Stay (HOSPITAL_BASED_OUTPATIENT_CLINIC_OR_DEPARTMENT_OTHER): Payer: BC Managed Care – PPO

## 2016-10-11 DIAGNOSIS — I5030 Unspecified diastolic (congestive) heart failure: Secondary | ICD-10-CM | POA: Diagnosis present

## 2016-10-11 DIAGNOSIS — R7989 Other specified abnormal findings of blood chemistry: Secondary | ICD-10-CM

## 2016-10-11 DIAGNOSIS — R778 Other specified abnormalities of plasma proteins: Secondary | ICD-10-CM | POA: Diagnosis present

## 2016-10-11 DIAGNOSIS — I110 Hypertensive heart disease with heart failure: Secondary | ICD-10-CM | POA: Diagnosis present

## 2016-10-11 DIAGNOSIS — I200 Unstable angina: Secondary | ICD-10-CM

## 2016-10-11 DIAGNOSIS — R079 Chest pain, unspecified: Secondary | ICD-10-CM | POA: Diagnosis not present

## 2016-10-11 DIAGNOSIS — I25110 Atherosclerotic heart disease of native coronary artery with unstable angina pectoris: Secondary | ICD-10-CM | POA: Diagnosis present

## 2016-10-11 DIAGNOSIS — J45909 Unspecified asthma, uncomplicated: Secondary | ICD-10-CM | POA: Diagnosis present

## 2016-10-11 DIAGNOSIS — Z7982 Long term (current) use of aspirin: Secondary | ICD-10-CM | POA: Diagnosis not present

## 2016-10-11 DIAGNOSIS — R072 Precordial pain: Secondary | ICD-10-CM | POA: Diagnosis present

## 2016-10-11 DIAGNOSIS — G43909 Migraine, unspecified, not intractable, without status migrainosus: Secondary | ICD-10-CM | POA: Diagnosis present

## 2016-10-11 DIAGNOSIS — Z79899 Other long term (current) drug therapy: Secondary | ICD-10-CM | POA: Diagnosis not present

## 2016-10-11 DIAGNOSIS — Z720 Tobacco use: Secondary | ICD-10-CM | POA: Diagnosis not present

## 2016-10-11 DIAGNOSIS — M5136 Other intervertebral disc degeneration, lumbar region: Secondary | ICD-10-CM | POA: Diagnosis present

## 2016-10-11 DIAGNOSIS — Z881 Allergy status to other antibiotic agents status: Secondary | ICD-10-CM | POA: Diagnosis not present

## 2016-10-11 DIAGNOSIS — E785 Hyperlipidemia, unspecified: Secondary | ICD-10-CM | POA: Diagnosis present

## 2016-10-11 DIAGNOSIS — F17210 Nicotine dependence, cigarettes, uncomplicated: Secondary | ICD-10-CM | POA: Diagnosis present

## 2016-10-11 HISTORY — PX: CORONARY ANGIOPLASTY WITH STENT PLACEMENT: SHX49

## 2016-10-11 HISTORY — PX: LEFT HEART CATH AND CORONARY ANGIOGRAPHY: CATH118249

## 2016-10-11 HISTORY — PX: CORONARY STENT INTERVENTION: CATH118234

## 2016-10-11 LAB — TROPONIN I
TROPONIN I: 0.06 ng/mL — AB (ref <0.03)
Troponin I: 0.07 ng/mL (ref <0.03)
Troponin I: 0.05 ng/mL (ref <0.03)
Troponin I: 0.09 ng/mL (ref <0.03)

## 2016-10-11 LAB — ECHOCARDIOGRAM COMPLETE
HEIGHTINCHES: 71 in
Weight: 3174.62 oz

## 2016-10-11 LAB — GLUCOSE, CAPILLARY: Glucose-Capillary: 85 mg/dL (ref 65–99)

## 2016-10-11 LAB — POCT ACTIVATED CLOTTING TIME: Activated Clotting Time: 483 seconds

## 2016-10-11 LAB — HEPARIN LEVEL (UNFRACTIONATED): Heparin Unfractionated: 0.37 IU/mL (ref 0.30–0.70)

## 2016-10-11 SURGERY — LEFT HEART CATH AND CORONARY ANGIOGRAPHY
Anesthesia: LOCAL

## 2016-10-11 MED ORDER — MORPHINE SULFATE (PF) 4 MG/ML IV SOLN: 2.0000 mg | INTRAVENOUS | Status: DC | PRN

## 2016-10-11 MED ORDER — SODIUM CHLORIDE 0.9% FLUSH: 3.0000 mL | INTRAVENOUS | Status: DC | PRN

## 2016-10-11 MED ORDER — MIDAZOLAM HCL 2 MG/2ML IJ SOLN
INTRAMUSCULAR | Status: AC
  Filled 2016-10-11: qty 2

## 2016-10-11 MED ORDER — SODIUM CHLORIDE 0.9% FLUSH
3.0000 mL | Freq: Two times a day (BID) | INTRAVENOUS | Status: DC
  Administered 2016-10-12: 3 mL via INTRAVENOUS

## 2016-10-11 MED ORDER — ONDANSETRON HCL 4 MG/2ML IJ SOLN: 4.0000 mg | Freq: Four times a day (QID) | INTRAMUSCULAR | Status: DC | PRN

## 2016-10-11 MED ORDER — ALBUTEROL SULFATE HFA 108 (90 BASE) MCG/ACT IN AERS: 2.0000 | INHALATION_SPRAY | RESPIRATORY_TRACT | Status: DC | PRN

## 2016-10-11 MED ORDER — TICAGRELOR 90 MG PO TABS
90.0000 mg | ORAL_TABLET | Freq: Two times a day (BID) | ORAL | Status: DC
  Administered 2016-10-12: 90 mg via ORAL
  Filled 2016-10-11 (×2): qty 1

## 2016-10-11 MED ORDER — HEPARIN (PORCINE) IN NACL 100-0.45 UNIT/ML-% IJ SOLN
1000.0000 [IU]/h | INTRAMUSCULAR | Status: DC
  Administered 2016-10-11: 1000 [IU]/h via INTRAVENOUS
  Filled 2016-10-11 (×2): qty 250

## 2016-10-11 MED ORDER — SODIUM CHLORIDE 0.9 % IV SOLN
1.7500 mg/kg/h | INTRAVENOUS | Status: AC
  Administered 2016-10-11 (×2): 1.75 mg/kg/h via INTRAVENOUS
  Filled 2016-10-11 (×2): qty 250

## 2016-10-11 MED ORDER — LIDOCAINE HCL (PF) 1 % IJ SOLN
INTRAMUSCULAR | Status: DC | PRN
  Administered 2016-10-11: 2 mL

## 2016-10-11 MED ORDER — HEPARIN (PORCINE) IN NACL 2-0.9 UNIT/ML-% IJ SOLN
INTRAMUSCULAR | Status: AC
  Filled 2016-10-11: qty 1000

## 2016-10-11 MED ORDER — MONTELUKAST SODIUM 10 MG PO TABS
10.0000 mg | ORAL_TABLET | Freq: Every day | ORAL | Status: DC
  Administered 2016-10-11: 10 mg via ORAL
  Filled 2016-10-11 (×2): qty 1

## 2016-10-11 MED ORDER — HEPARIN (PORCINE) IN NACL 2-0.9 UNIT/ML-% IJ SOLN
INTRAMUSCULAR | Status: AC | PRN
  Administered 2016-10-11: 1000 mL

## 2016-10-11 MED ORDER — BIVALIRUDIN TRIFLUOROACETATE 250 MG IV SOLR
INTRAVENOUS | Status: AC
Start: 2016-10-11 — End: 2016-10-11
  Filled 2016-10-11: qty 250

## 2016-10-11 MED ORDER — HEPARIN SODIUM (PORCINE) 1000 UNIT/ML IJ SOLN
INTRAMUSCULAR | Status: DC | PRN
  Administered 2016-10-11: 4500 [IU] via INTRAVENOUS

## 2016-10-11 MED ORDER — FENTANYL CITRATE (PF) 100 MCG/2ML IJ SOLN
INTRAMUSCULAR | Status: AC
  Filled 2016-10-11: qty 2

## 2016-10-11 MED ORDER — ACETAMINOPHEN 325 MG PO TABS: 650.0000 mg | ORAL_TABLET | ORAL | Status: DC | PRN

## 2016-10-11 MED ORDER — ALPRAZOLAM 0.25 MG PO TABS
0.2500 mg | ORAL_TABLET | Freq: Two times a day (BID) | ORAL | Status: DC | PRN
  Administered 2016-10-11: 0.25 mg via ORAL
  Filled 2016-10-11: qty 1

## 2016-10-11 MED ORDER — ENOXAPARIN SODIUM 40 MG/0.4ML ~~LOC~~ SOLN: 40.0000 mg | SUBCUTANEOUS | Status: DC

## 2016-10-11 MED ORDER — BIVALIRUDIN TRIFLUOROACETATE 250 MG IV SOLR
INTRAVENOUS | Status: AC
  Filled 2016-10-11: qty 250

## 2016-10-11 MED ORDER — BIVALIRUDIN BOLUS VIA INFUSION - CUPID
INTRAVENOUS | Status: DC | PRN
  Administered 2016-10-11: 67.5 mg via INTRAVENOUS

## 2016-10-11 MED ORDER — ASPIRIN EC 81 MG PO TBEC
81.0000 mg | DELAYED_RELEASE_TABLET | Freq: Every evening | ORAL | Status: DC
  Administered 2016-10-11: 81 mg via ORAL
  Filled 2016-10-11: qty 1

## 2016-10-11 MED ORDER — FENTANYL CITRATE (PF) 100 MCG/2ML IJ SOLN
INTRAMUSCULAR | Status: DC | PRN
  Administered 2016-10-11: 25 ug via INTRAVENOUS

## 2016-10-11 MED ORDER — NITROGLYCERIN 1 MG/10 ML FOR IR/CATH LAB
INTRA_ARTERIAL | Status: AC
  Filled 2016-10-11: qty 10

## 2016-10-11 MED ORDER — ATORVASTATIN CALCIUM 40 MG PO TABS
40.0000 mg | ORAL_TABLET | Freq: Every day | ORAL | Status: DC
  Administered 2016-10-11: 21:00:00 40 mg via ORAL
  Filled 2016-10-11: qty 1

## 2016-10-11 MED ORDER — VERAPAMIL HCL 2.5 MG/ML IV SOLN
INTRAVENOUS | Status: AC
  Filled 2016-10-11: qty 2

## 2016-10-11 MED ORDER — HYDRALAZINE HCL 20 MG/ML IJ SOLN: 5.0000 mg | INTRAMUSCULAR | Status: AC | PRN

## 2016-10-11 MED ORDER — TICAGRELOR 90 MG PO TABS
ORAL_TABLET | ORAL | Status: AC
  Filled 2016-10-11: qty 2

## 2016-10-11 MED ORDER — HEPARIN BOLUS VIA INFUSION
4000.0000 [IU] | Freq: Once | INTRAVENOUS | Status: AC
  Administered 2016-10-11: 4000 [IU] via INTRAVENOUS
  Filled 2016-10-11: qty 4000

## 2016-10-11 MED ORDER — SODIUM CHLORIDE 0.9 % IV SOLN: 250.0000 mL | INTRAVENOUS | Status: DC | PRN

## 2016-10-11 MED ORDER — ALBUTEROL SULFATE (2.5 MG/3ML) 0.083% IN NEBU: 2.5000 mg | INHALATION_SOLUTION | Freq: Four times a day (QID) | RESPIRATORY_TRACT | Status: DC | PRN

## 2016-10-11 MED ORDER — LABETALOL HCL 5 MG/ML IV SOLN: 10.0000 mg | INTRAVENOUS | Status: AC | PRN

## 2016-10-11 MED ORDER — VERAPAMIL HCL 2.5 MG/ML IV SOLN
INTRA_ARTERIAL | Status: DC | PRN
  Administered 2016-10-11: 5 mL via INTRA_ARTERIAL
  Administered 2016-10-11: 8 mL via INTRA_ARTERIAL

## 2016-10-11 MED ORDER — IOPAMIDOL (ISOVUE-370) INJECTION 76%
INTRAVENOUS | Status: AC
  Filled 2016-10-11: qty 100

## 2016-10-11 MED ORDER — SODIUM CHLORIDE 0.9 % IV SOLN
INTRAVENOUS | Status: AC | PRN
  Administered 2016-10-11: 1.75 mg/kg/h via INTRAVENOUS

## 2016-10-11 MED ORDER — ANGIOPLASTY BOOK
Freq: Once | Status: DC
  Filled 2016-10-11: qty 1

## 2016-10-11 MED ORDER — MIDAZOLAM HCL 2 MG/2ML IJ SOLN
INTRAMUSCULAR | Status: DC | PRN
  Administered 2016-10-11: 1 mg via INTRAVENOUS

## 2016-10-11 MED ORDER — ASPIRIN 81 MG PO CHEW
81.0000 mg | CHEWABLE_TABLET | Freq: Every day | ORAL | Status: DC
  Administered 2016-10-12: 10:00:00 81 mg via ORAL
  Filled 2016-10-11: qty 1

## 2016-10-11 MED ORDER — SODIUM CHLORIDE 0.9 % IV SOLN
INTRAVENOUS | Status: AC | PRN
  Administered 2016-10-11: 50 mL/h via INTRAVENOUS

## 2016-10-11 MED ORDER — SODIUM CHLORIDE 0.9 % IV SOLN: INTRAVENOUS | Status: AC

## 2016-10-11 MED ORDER — HEPARIN SODIUM (PORCINE) 1000 UNIT/ML IJ SOLN
INTRAMUSCULAR | Status: AC
  Filled 2016-10-11: qty 1

## 2016-10-11 SURGICAL SUPPLY — 21 items
BALLN SAPPHIRE 2.5X12 (BALLOONS) ×2
BALLN SAPPHIRE ~~LOC~~ 3.25X12 (BALLOONS) ×2 IMPLANT
BALLOON SAPPHIRE 2.5X12 (BALLOONS) ×1 IMPLANT
CATH INFINITI 5 FR JL3.5 (CATHETERS) ×2 IMPLANT
CATH INFINITI 5FR ANG PIGTAIL (CATHETERS) ×2 IMPLANT
CATH OPTITORQUE JACKY 4.0 5F (CATHETERS) IMPLANT
CATH OPTITORQUE TIG 4.0 5F (CATHETERS) ×2 IMPLANT
CATH VISTA GUIDE 6FR JR4 (CATHETERS) ×2 IMPLANT
DEVICE RAD COMP TR BAND LRG (VASCULAR PRODUCTS) ×2 IMPLANT
GLIDESHEATH SLEND A-KIT 6F 22G (SHEATH) ×2 IMPLANT
GUIDEWIRE INQWIRE 1.5J.035X260 (WIRE) ×1 IMPLANT
INQWIRE 1.5J .035X260CM (WIRE) ×2
KIT ENCORE 26 ADVANTAGE (KITS) ×2 IMPLANT
KIT HEART LEFT (KITS) ×2 IMPLANT
PACK CARDIAC CATHETERIZATION (CUSTOM PROCEDURE TRAY) ×2 IMPLANT
STENT SYNERGY DES 3X16 (Permanent Stent) ×2 IMPLANT
SYR MEDRAD MARK V 150ML (SYRINGE) ×2 IMPLANT
TRANSDUCER W/STOPCOCK (MISCELLANEOUS) ×2 IMPLANT
TUBING CIL FLEX 10 FLL-RA (TUBING) ×2 IMPLANT
WIRE ASAHI PROWATER 180CM (WIRE) ×2 IMPLANT
WIRE HI TORQ VERSACORE-J 145CM (WIRE) ×2 IMPLANT

## 2016-10-11 NOTE — ED Notes (Signed)
Patient transported to X-ray 

## 2016-10-11 NOTE — Consult Note (Signed)
 Cardiology Consultation:   Patient ID: Joseph Jacobs; [id number]; 01/28/1956   Admit date: 10/10/2016 Date of Consult: 10/11/2016  Primary Care Provider: Thekkekandam, Thomas J, MD Primary Cardiologist: Dr. Hilty Primary Electrophysiologist:     Patient Profile:   Joseph Jacobs is a 61 y.o. male with a hx of CAD in the LAD by CTA (2017), current smoker, HLD, migraine, asthma, and lumbar DDD who is being seen today for the evaluation of chest pain at the request of Dr. Mathews.  History of Present Illness:   Joseph Jacobs was recently seen in the clinic by Dr. Hilty on 09/27/16. At that time he complained of exertional chest pain for approximately one month. The chest pain was relieved with rest, radiated down his left arm with no other associated symptoms. Given his recent stress test that was low risk, but known coronary disease in the LAD by CTA (2017), he was scheduled for a CT coronary with FFR. Unfortunately, the pt experienced a recurrence of chest pain before the scan and reported to [facility]. He called [facility] offices first and was advised to go to the ER for his chest pain at rest that has been relieved with nitro.   On my interview, he states that he has been having exertional chest pain that was relieved with rest, radiated down his right arm and described as a burning sensation. This prompted his cardiology clinic visit. Over the past two days, he has had three episodes of chest burning similar to the exertional chest pain he had been having while at rest. One episoe woke him from sleep in the middle of the night. Nitro has relieved this chest pain that occurred at rest. No other associated symptoms other than radiation of dull pain down his left arm. On arrival to the ED, he was started on heparin drip and troponins were found to be mildly elevated and flat. NSR on telemetry. He denies chest pain since being in the ER.  He is active, but is a current smoker.  Of note, he was taking  imitrex for migraines which produced the chest burning sensation and was associated with lightheadedness and diaphoresis. After seeing Dr. Hilty in clinic, he has not taken imitrex again and was started on propranolol for PPX, which has helped his migraines.   Past Medical History:  Diagnosis Date  . Asthma   . Lumbar degenerative disc disease 12/14/2013  . Migraine 12/14/2013  . Stress fracture of foot 12/14/2013    Past Surgical History:  Procedure Laterality Date  . BILATERAL CARPAL TUNNEL RELEASE    . KNEE SURGERY       Home Medications:  Prior to Admission medications   Medication Sig Start Date End Date Taking? Authorizing Provider  albuterol (PROVENTIL HFA;VENTOLIN HFA) 108 (90 Base) MCG/ACT inhaler Inhale two puffs every 4-6 hours only as needed for shortness of breath or wheezing. Patient taking differently: Inhale 2 puffs into the lungs See admin instructions. Every 4-6 hours only as needed for shortness of breath or wheezing 01/19/16 01/18/17 Yes Thekkekandam, Thomas J, MD  albuterol (PROVENTIL) (2.5 MG/3ML) 0.083% nebulizer solution Take 3 mLs (2.5 mg total) by nebulization every 6 (six) hours as needed for wheezing or shortness of breath. 04/24/16  Yes Thekkekandam, Thomas J, MD  aspirin EC 81 MG tablet Take 1 tablet (81 mg total) by mouth daily. Patient taking differently: Take 81 mg by mouth every evening.  07/06/15  Yes Hommel, Sean, DO  atorvastatin (LIPITOR) 40 MG tablet Take 1 tablet (  40 mg total) by mouth at bedtime. 09/03/16  Yes Thekkekandam, Thomas J, MD  ibuprofen (ADVIL,MOTRIN) 200 MG tablet Take 200-400 mg by mouth every 6 (six) hours as needed (for pain or headaches).   Yes [provider]  metoprolol succinate (TOPROL XL) 25 MG 24 hr tablet Take 1 tablet (25 mg total) by mouth daily. 09/27/16  Yes Hilty, Kenneth C, MD  montelukast (SINGULAIR) 10 MG tablet Take 1 tablet (10 mg total) by mouth at bedtime. 03/08/16  Yes Thekkekandam, Thomas J, MD  naproxen  sodium (ALEVE) 220 MG tablet Take 220-440 mg by mouth 2 (two) times daily as needed (for pain or headaches).   Yes [provider]  nitroGLYCERIN (NITROSTAT) 0.4 MG SL tablet Place 1 tablet (0.4 mg total) under the tongue every 5 (five) minutes as needed for chest pain (or tightness). 09/26/16  Yes Thekkekandam, Thomas J, MD  cyclobenzaprine (FLEXERIL) 10 MG tablet One half tab PO qHS, then increase gradually to one tab TID. Patient not taking: Reported on 10/10/2016 09/03/16   Thekkekandam, Thomas J, MD  meloxicam (MOBIC) 15 MG tablet Take 1 tablet (15 mg total) by mouth daily. Take with a meal. 06/28/16   Thekkekandam, Thomas J, MD    Inpatient Medications: Scheduled Meds: . aspirin EC  81 mg Oral QPM  . atorvastatin  40 mg Oral QHS  . montelukast  10 mg Oral QHS   Continuous Infusions: . heparin 1,000 Units/hr (10/11/16 0209)   PRN Meds: acetaminophen, albuterol, ALPRAZolam, morphine injection, ondansetron (ZOFRAN) IV  Allergies:    Allergies  Allergen Reactions  . E-Mycin [Erythromycin] Nausea Only    GI upset     Social History:   Social History   Social History  . Marital status: Married    Spouse name: N/A  . Number of children: N/A  . Years of education: N/A   Occupational History  . Not on file.   Social History Main Topics  . Smoking status: Current Every Day Smoker    Packs/day: 0.25    Years: 40.00    Types: Cigarettes  . Smokeless tobacco: Never Used  . Alcohol use 1.8 oz/week    3 Standard drinks or equivalent per week     Comment: couple glasses of wine a week  . Drug use: No  . Sexual activity: Yes    Partners: Female   Other Topics Concern  . Not on file   Social History Narrative  . No narrative on file    Family History:    Family History  Problem Relation Age of Onset  . Brain cancer Father   . Alcoholism Mother   . Diabetes Mother        father   . Lung cancer Mother   . Cancer Unknown      ROS:  Please see the history of  present illness.  ROS  All other ROS reviewed and negative.     Physical Exam/Data:   Vitals:   10/11/16 1130 10/11/16 1145 10/11/16 1200 10/11/16 1335  BP: 113/62 124/76  122/87  Pulse: 65 65  69  Resp:  16  14  Temp:      TempSrc:      SpO2: 94% 92% 93% 96%  Weight:      Height:       No intake or output data in the 24 hours ending 10/11/16 1341 Filed Weights   10/11/16 0115  Weight: 198 lb 6.6 oz (90 kg)   Body mass   index is 27.67 kg/m.  General:  Well nourished, well developed, in no acute distress HEENT: normal Neck: no JVD Vascular: No carotid bruits; FA pulses 2+ bilaterally without bruits  Cardiac:  normal S1, S2; RRR; no murmur  Lungs:  clear to auscultation bilaterally, no wheezing, rhonchi or rales  Abd: soft, nontender, no hepatomegaly  Ext: no edema Musculoskeletal:  No deformities, BUE and BLE strength normal and equal Skin: warm and dry  Neuro:  CNs 2-12 intact, no focal abnormalities noted Psych:  Normal affect   EKG:  The EKG was personally reviewed and demonstrates:  Sinus rhythm, TWI in lead III Telemetry:  Telemetry was personally reviewed and demonstrates:  Sinus rhythm  Relevant CV Studies:  CT angiography: future/scheduled  Echocardiogram 10/11/16: pending read  Myoview 07/19/15: low risk without ischemia, normal LVEF  CTA 07/05/15: Calcifications in the LAD, No PE   Laboratory Data:  Chemistry Recent Labs Lab 10/10/16 1825  NA 139  K 3.6  CL 108  CO2 22  GLUCOSE 158*  BUN 10  CREATININE 0.85  CALCIUM 9.3  GFRNONAA >60  GFRAA >60  ANIONGAP 9    No results for input(s): PROT, ALBUMIN, AST, ALT, ALKPHOS, BILITOT in the last 168 hours. Hematology Recent Labs Lab 10/10/16 1825  WBC 7.3  RBC 4.80  HGB 15.0  HCT 44.3  MCV 92.3  MCH 31.3  MCHC 33.9  RDW 12.5  PLT 271   Cardiac Enzymes Recent Labs Lab 10/10/16 2319 10/11/16 0450 10/11/16 0759  TROPONINI 0.07* 0.05* 0.06*    Recent Labs Lab 10/10/16 1836  10/10/16 2251  TROPIPOC 0.01 0.07    BNPNo results for input(s): BNP, PROBNP in the last 168 hours.  DDimer No results for input(s): DDIMER in the last 168 hours.  Radiology/Studies:  Dg Chest 2 View  Result Date: 10/11/2016 CLINICAL DATA:  Chest pain for 1 week. EXAM: CHEST  2 VIEW COMPARISON:  Radiograph 09/26/2016 FINDINGS: The cardiomediastinal contours are normal. The lungs are clear. Pulmonary vasculature is normal. No consolidation, pleural effusion, or pneumothorax. No acute osseous abnormalities are seen. IMPRESSION: No acute pulmonary process. Electronically Signed   By: Melanie  Ehinger M.D.   On: 10/11/2016 03:10    Assessment and Plan:   1. Chest pain, elevated troponin - troponin: 0.07 --> 0.05 --> 0.06 - EKG unchanged, except new TWI in lead III His exertional chest pain that was previously relieved with rest has now transitioned to chest pain at rest that is relieved by nitroglycerin. His symptoms sound anginal in nature. Echocardiogram completed, but not yet read. Will discuss with attending possible heart catheterization this afternoon. Patient remains NPO.    2. HLD - continue lipitor 40 mg - LDL 108, Triglycerides 188, consider increasing lipitor to 80 mg - pt complains of some myalgias, will discuss changing to crestor   3. Migraines - he has recently taken imitrex, no longer takes given his resultant chest pain - started propranolol for PPX which has helped his migraines    For questions or updates, please contact [facility] Please consult [email or web address] for contact info under Cardiology/STEMI.   Signed, Angela Nicole Duke, PA  10/11/2016 1:41 PM   I have seen and examined the patient along with Angela Nicole Duke, PA.   I have reviewed the chart, notes and new data.  I agree with PA/NP's note.  Key new complaints:Symptoms are highly suggestive of unstable angina pectoris Key examination changes: Normal cardiovascular exam Key new findings / data:  No major   changes on ECG, but cardiac enzymes are mildly abnormal. Echo does not show regional wall motion abnormalities, LVEF is normal.  PLAN: Joseph Jacobs has anatomical evidence of significant calcified atherosclerotic plaque in the LAD artery, symptoms consistent with unstable angina pectoris and mildly abnormal cardiac enzymes. Intravenous heparin has been started. Recommend coronary angiography and revascularization as indicated. This procedure has been fully reviewed with the patient and written informed consent has been obtained.   Gentry Seeber, MD, FACC [facility] [phone] 10/11/2016, 3:40 PM   

## 2016-10-11 NOTE — Progress Notes (Signed)
ANTICOAGULATION CONSULT NOTE - Initial Consult  Pharmacy Consult for heparin Indication: chest pain/ACS  Allergies  Allergen Reactions  . E-Mycin [Erythromycin] Nausea Only    GI upset     Patient Measurements: Height: 5\' 11"  (180.3 cm) Weight: 198 lb 6.6 oz (90 kg) IBW/kg (Calculated) : 75.3  Vital Signs: Temp: 98.4 F (36.9 C) (09/12 2057) Temp Source: Oral (09/12 2057) BP: 118/64 (09/13 0115) Pulse Rate: 69 (09/12 2245)  Labs:  Recent Labs  10/10/16 1825 10/10/16 2319  HGB 15.0  --   HCT 44.3  --   PLT 271  --   CREATININE 0.85  --   TROPONINI  --  0.07*    Estimated Creatinine Clearance: 98.4 mL/min (by C-G formula based on SCr of 0.85 mg/dL).   Medical History: Past Medical History:  Diagnosis Date  . Asthma   . Lumbar degenerative disc disease 12/14/2013  . Migraine 12/14/2013  . Stress fracture of foot 12/14/2013    Assessment: 61yo male c/o CP x1wk, tonight NTG x1 at home eased pain, troponin mildly elevated, to begin heparin.  Goal of Therapy:  Heparin level 0.3-0.7 units/ml Monitor platelets by anticoagulation protocol: Yes   Plan:  Will give heparin 4000 units IV bolus x1 followed by gtt at 1000 units/hr and monitor heparin levels and CBC.  Vernard GamblesVeronda Carigan Lister, PharmD, BCPS  10/11/2016,1:33 AM

## 2016-10-11 NOTE — ED Notes (Signed)
Lab called critical troponin of 0.07.

## 2016-10-11 NOTE — Telephone Encounter (Signed)
Busy in clinic, please have him let me know what its in reference to so I can be prepared.

## 2016-10-11 NOTE — ED Notes (Signed)
Pt ambulated to bathroom by self. NAD noted.

## 2016-10-11 NOTE — Interval H&P Note (Signed)
Cath Lab Visit (complete for each Cath Lab visit)  Clinical Evaluation Leading to the Procedure:   ACS: Yes.    Non-ACS:    Anginal Classification: CCS III  Anti-ischemic medical therapy: Minimal Therapy (1 class of medications)  Non-Invasive Test Results: Low-risk stress test findings: cardiac mortality <1%/year  Prior CABG: No previous CABG      History and Physical Interval Note:  10/11/2016 4:40 PM  Joseph Jacobs  has presented today for surgery, with the diagnosis of unstable angina  The various methods of treatment have been discussed with the patient and family. After consideration of risks, benefits and other options for treatment, the patient has consented to  Procedure(s): LEFT HEART CATH AND CORONARY ANGIOGRAPHY (N/A) as a surgical intervention .  The patient's history has been reviewed, patient examined, no change in status, stable for surgery.  I have reviewed the patient's chart and labs.  Questions were answered to the patient's satisfaction.     Nanetta BattyBerry, Kyani Simkin

## 2016-10-11 NOTE — ED Notes (Signed)
Walked patient to the bathroom patient did well patient is back in bed resting 

## 2016-10-11 NOTE — H&P (View-Only) (Signed)
Cardiology Consultation:   Patient ID: Joseph Jacobs; [id number]; 1955/08/26   Admit date: 10/10/2016 Date of Consult: 10/11/2016  Primary Care Provider: Monica Becton, MD Primary Cardiologist: Dr. Rennis Golden Primary Electrophysiologist:     Patient Profile:   Joseph Jacobs is a 61 y.o. male with a hx of CAD in the LAD by CTA (2017), current smoker, HLD, migraine, asthma, and lumbar DDD who is being seen today for the evaluation of chest pain at the request of Dr. Jerolyn Center.  History of Present Illness:   Joseph Jacobs was recently seen in the clinic by Dr. Rennis Golden on 09/27/16. At that time he complained of exertional chest pain for approximately one month. The chest pain was relieved with rest, radiated down his left arm with no other associated symptoms. Given his recent stress test that was low risk, but known coronary disease in the LAD by CTA (2017), he was scheduled for a CT coronary with FFR. Unfortunately, the pt experienced a recurrence of chest pain before the scan and reported to [facility]. He called [facility] offices first and was advised to go to the ER for his chest pain at rest that has been relieved with nitro.   On my interview, he states that he has been having exertional chest pain that was relieved with rest, radiated down his right arm and described as a burning sensation. This prompted his cardiology clinic visit. Over the past two days, he has had three episodes of chest burning similar to the exertional chest pain he had been having while at rest. One episoe woke him from sleep in the middle of the night. Nitro has relieved this chest pain that occurred at rest. No other associated symptoms other than radiation of dull pain down his left arm. On arrival to the ED, he was started on heparin drip and troponins were found to be mildly elevated and flat. NSR on telemetry. He denies chest pain since being in the ER.  He is active, but is a current smoker.  Of note, he was taking  imitrex for migraines which produced the chest burning sensation and was associated with lightheadedness and diaphoresis. After seeing Dr. Rennis Golden in clinic, he has not taken imitrex again and was started on propranolol for PPX, which has helped his migraines.   Past Medical History:  Diagnosis Date  . Asthma   . Lumbar degenerative disc disease 12/14/2013  . Migraine 12/14/2013  . Stress fracture of foot 12/14/2013    Past Surgical History:  Procedure Laterality Date  . BILATERAL CARPAL TUNNEL RELEASE    . KNEE SURGERY       Home Medications:  Prior to Admission medications   Medication Sig Start Date End Date Taking? Authorizing Provider  albuterol (PROVENTIL HFA;VENTOLIN HFA) 108 (90 Base) MCG/ACT inhaler Inhale two puffs every 4-6 hours only as needed for shortness of breath or wheezing. Patient taking differently: Inhale 2 puffs into the lungs See admin instructions. Every 4-6 hours only as needed for shortness of breath or wheezing 01/19/16 01/18/17 Yes Monica Becton, MD  albuterol (PROVENTIL) (2.5 MG/3ML) 0.083% nebulizer solution Take 3 mLs (2.5 mg total) by nebulization every 6 (six) hours as needed for wheezing or shortness of breath. 04/24/16  Yes Monica Becton, MD  aspirin EC 81 MG tablet Take 1 tablet (81 mg total) by mouth daily. Patient taking differently: Take 81 mg by mouth every evening.  07/06/15  Yes Hommel, Sean, DO  atorvastatin (LIPITOR) 40 MG tablet Take 1 tablet (  40 mg total) by mouth at bedtime. 09/03/16  Yes Monica Becton, MD  ibuprofen (ADVIL,MOTRIN) 200 MG tablet Take 200-400 mg by mouth every 6 (six) hours as needed (for pain or headaches).   Yes [provider]  metoprolol succinate (TOPROL XL) 25 MG 24 hr tablet Take 1 tablet (25 mg total) by mouth daily. 09/27/16  Yes Hilty, Lisette Abu, MD  montelukast (SINGULAIR) 10 MG tablet Take 1 tablet (10 mg total) by mouth at bedtime. 03/08/16  Yes Monica Becton, MD  naproxen  sodium (ALEVE) 220 MG tablet Take 220-440 mg by mouth 2 (two) times daily as needed (for pain or headaches).   Yes [provider]  nitroGLYCERIN (NITROSTAT) 0.4 MG SL tablet Place 1 tablet (0.4 mg total) under the tongue every 5 (five) minutes as needed for chest pain (or tightness). 09/26/16  Yes Monica Becton, MD  cyclobenzaprine (FLEXERIL) 10 MG tablet One half tab PO qHS, then increase gradually to one tab TID. Patient not taking: Reported on 10/10/2016 09/03/16   Monica Becton, MD  meloxicam (MOBIC) 15 MG tablet Take 1 tablet (15 mg total) by mouth daily. Take with a meal. 06/28/16   Monica Becton, MD    Inpatient Medications: Scheduled Meds: . aspirin EC  81 mg Oral QPM  . atorvastatin  40 mg Oral QHS  . montelukast  10 mg Oral QHS   Continuous Infusions: . heparin 1,000 Units/hr (10/11/16 0209)   PRN Meds: acetaminophen, albuterol, ALPRAZolam, morphine injection, ondansetron (ZOFRAN) IV  Allergies:    Allergies  Allergen Reactions  . E-Mycin [Erythromycin] Nausea Only    GI upset     Social History:   Social History   Social History  . Marital status: Married    Spouse name: N/A  . Number of children: N/A  . Years of education: N/A   Occupational History  . Not on file.   Social History Main Topics  . Smoking status: Current Every Day Smoker    Packs/day: 0.25    Years: 40.00    Types: Cigarettes  . Smokeless tobacco: Never Used  . Alcohol use 1.8 oz/week    3 Standard drinks or equivalent per week     Comment: couple glasses of wine a week  . Drug use: No  . Sexual activity: Yes    Partners: Female   Other Topics Concern  . Not on file   Social History Narrative  . No narrative on file    Family History:    Family History  Problem Relation Age of Onset  . Brain cancer Father   . Alcoholism Mother   . Diabetes Mother        father   . Lung cancer Mother   . Cancer Unknown      ROS:  Please see the history of  present illness.  ROS  All other ROS reviewed and negative.     Physical Exam/Data:   Vitals:   10/11/16 1130 10/11/16 1145 10/11/16 1200 10/11/16 1335  BP: 113/62 124/76  122/87  Pulse: 65 65  69  Resp:  16  14  Temp:      TempSrc:      SpO2: 94% 92% 93% 96%  Weight:      Height:       No intake or output data in the 24 hours ending 10/11/16 1341 Filed Weights   10/11/16 0115  Weight: 198 lb 6.6 oz (90 kg)   Body mass  index is 27.67 kg/m.  General:  Well nourished, well developed, in no acute distress HEENT: normal Neck: no JVD Vascular: No carotid bruits; FA pulses 2+ bilaterally without bruits  Cardiac:  normal S1, S2; RRR; no murmur  Lungs:  clear to auscultation bilaterally, no wheezing, rhonchi or rales  Abd: soft, nontender, no hepatomegaly  Ext: no edema Musculoskeletal:  No deformities, BUE and BLE strength normal and equal Skin: warm and dry  Neuro:  CNs 2-12 intact, no focal abnormalities noted Psych:  Normal affect   EKG:  The EKG was personally reviewed and demonstrates:  Sinus rhythm, TWI in lead III Telemetry:  Telemetry was personally reviewed and demonstrates:  Sinus rhythm  Relevant CV Studies:  CT angiography: future/scheduled  Echocardiogram 10/11/16: pending read  Myoview 07/19/15: low risk without ischemia, normal LVEF  CTA 07/05/15: Calcifications in the LAD, No PE   Laboratory Data:  Chemistry Recent Labs Lab 10/10/16 1825  NA 139  K 3.6  CL 108  CO2 22  GLUCOSE 158*  BUN 10  CREATININE 0.85  CALCIUM 9.3  GFRNONAA >60  GFRAA >60  ANIONGAP 9    No results for input(s): PROT, ALBUMIN, AST, ALT, ALKPHOS, BILITOT in the last 168 hours. Hematology Recent Labs Lab 10/10/16 1825  WBC 7.3  RBC 4.80  HGB 15.0  HCT 44.3  MCV 92.3  MCH 31.3  MCHC 33.9  RDW 12.5  PLT 271   Cardiac Enzymes Recent Labs Lab 10/10/16 2319 10/11/16 0450 10/11/16 0759  TROPONINI 0.07* 0.05* 0.06*    Recent Labs Lab 10/10/16 1836  10/10/16 2251  TROPIPOC 0.01 0.07    BNPNo results for input(s): BNP, PROBNP in the last 168 hours.  DDimer No results for input(s): DDIMER in the last 168 hours.  Radiology/Studies:  Dg Chest 2 View  Result Date: 10/11/2016 CLINICAL DATA:  Chest pain for 1 week. EXAM: CHEST  2 VIEW COMPARISON:  Radiograph 09/26/2016 FINDINGS: The cardiomediastinal contours are normal. The lungs are clear. Pulmonary vasculature is normal. No consolidation, pleural effusion, or pneumothorax. No acute osseous abnormalities are seen. IMPRESSION: No acute pulmonary process. Electronically Signed   By: Rubye Oaks M.D.   On: 10/11/2016 03:10    Assessment and Plan:   1. Chest pain, elevated troponin - troponin: 0.07 --> 0.05 --> 0.06 - EKG unchanged, except new TWI in lead III His exertional chest pain that was previously relieved with rest has now transitioned to chest pain at rest that is relieved by nitroglycerin. His symptoms sound anginal in nature. Echocardiogram completed, but not yet read. Will discuss with attending possible heart catheterization this afternoon. Patient remains NPO.    2. HLD - continue lipitor 40 mg - LDL 108, Triglycerides 188, consider increasing lipitor to 80 mg - pt complains of some myalgias, will discuss changing to crestor   3. Migraines - he has recently taken imitrex, no longer takes given his resultant chest pain - started propranolol for PPX which has helped his migraines    For questions or updates, please contact [facility] Please consult [email or web address] for contact info under Cardiology/STEMI.   Signed, Marcelino Duster, PA  10/11/2016 1:41 PM   I have seen and examined the patient along with Marcelino Duster, PA.   I have reviewed the chart, notes and new data.  I agree with PA/NP's note.  Key new complaints:Symptoms are highly suggestive of unstable angina pectoris Key examination changes: Normal cardiovascular exam Key new findings / data:  No major  changes on ECG, but cardiac enzymes are mildly abnormal. Echo does not show regional wall motion abnormalities, LVEF is normal.  PLAN: Joseph Jacobs has anatomical evidence of significant calcified atherosclerotic plaque in the LAD artery, symptoms consistent with unstable angina pectoris and mildly abnormal cardiac enzymes. Intravenous heparin has been started. Recommend coronary angiography and revascularization as indicated. This procedure has been fully reviewed with the patient and written informed consent has been obtained.   Thurmon FairMihai Larysa Pall, MD, [facility] HospitalFACC [facility] [phone] 10/11/2016, 3:40 PM

## 2016-10-11 NOTE — Progress Notes (Signed)
  Echocardiogram 2D Echocardiogram has been performed.  [name], Joseph Jacobs 10/11/2016, 12:00 PM

## 2016-10-11 NOTE — ED Notes (Signed)
Cardiology in room. They are trying to find available cath lab space for pt this afternoon and will notify this RN asap regarding plan. Pt still NPO

## 2016-10-11 NOTE — ED Notes (Signed)
Admitting MD at bedside.

## 2016-10-11 NOTE — Progress Notes (Signed)
ANTICOAGULATION CONSULT NOTE - Follow Up Consult  Pharmacy Consult for heparin Indication: chest pain/ACS  Allergies  Allergen Reactions  . E-Mycin [Erythromycin] Nausea Only    GI upset     Patient Measurements: Height: 5\' 11"  (180.3 cm) Weight: 198 lb 6.6 oz (90 kg) IBW/kg (Calculated) : 75.3  Vital Signs: BP: 108/70 (09/13 0700) Pulse Rate: 59 (09/13 0700)  Labs:  Recent Labs  10/10/16 1825 10/10/16 2319 10/11/16 0450 10/11/16 0759 10/11/16 0800  HGB 15.0  --   --   --   --   HCT 44.3  --   --   --   --   PLT 271  --   --   --   --   HEPARINUNFRC  --   --   --   --  0.37  CREATININE 0.85  --   --   --   --   TROPONINI  --  0.07* 0.05* 0.06*  --     Estimated Creatinine Clearance: 98.4 mL/min (by C-G formula based on SCr of 0.85 mg/dL).   Medical History: Past Medical History:  Diagnosis Date  . Asthma   . Lumbar degenerative disc disease 12/14/2013  . Migraine 12/14/2013  . Stress fracture of foot 12/14/2013    Assessment: 61yo male on IV heparin for ACS awaiting cardiology assessent. HL this AM is therapeutic at 0.37 on heparin 1000 units/hr.   Goal of Therapy:  Heparin level 0.3-0.7 units/ml Monitor platelets by anticoagulation protocol: Yes   Plan:  -Continue IV heparin at 1000 units/hr -F/u 6 hr confirmatory level  -F/u cardiology plans  Joseph Jacobs, PharmD., BCPS Clinical Pharmacist Pager [phone]

## 2016-10-11 NOTE — Progress Notes (Signed)
PROGRESS NOTE    Joseph Jacobs  ZOX:[id number] DOB: 09/14/1955 DOA: 10/10/2016 PCP: Monica Bectonhekkekandam, Thomas J, MD    Brief Narrative 61 year old male admitted with chest pain with mildly elevated troponin 0.05 increased to 0.06 with no acute EKG changes admitted for possible stress test versus cath.. Patient has not had any further episodes after coming into the hospital. He reports his pain increased after using Imitrex for migraine. He denies any nausea or vomiting. No shortness of breath no headaches.   Assessment & Plan:   Active Problems:   Dyslipidemia   Chest pain   Tobacco abuse   Elevated troponin  Chest pain with no acute EKG changes and mildly elevated troponin elevating cardiac consultation continue IV heparin. Echocardiogram has been done and the results are pending at this time. Patient has no history of previous cardiac history.    DVT prophylaxis: Heparin Code Status: Full  Family Communication: Discussed with wife Disposition Plan: Pending stress test   Consultants cardiology  Procedures: none   Antimicrobials: none   Subjective: No further chest pain   Objective: Vitals:   10/11/16 1045 10/11/16 1130 10/11/16 1145 10/11/16 1200  BP: 129/82 113/62 124/76   Pulse: 70 65 65   Resp:   16   Temp:      TempSrc:      SpO2: 90% 94% 92% 93%  Weight:      Height:       No intake or output data in the 24 hours ending 10/11/16 1310 Filed Weights   10/11/16 0115  Weight: 90 kg (198 lb 6.6 oz)    Examination:  General exam: Appears calm and comfortable  Respiratory system: Clear to auscultation. Respiratory effort normal. Cardiovascular system: S1 & S2 heard, RRR. No JVD, murmurs, rubs, gallops or clicks. No pedal edema. Gastrointestinal system: Abdomen is nondistended, soft and nontender. No organomegaly or masses felt. Normal bowel sounds heard. Central nervous system: Alert and oriented. No focal neurological deficits. Extremities: Symmetric 5 x  5 power. Skin: No rashes, lesions or ulcers Psychiatry: Judgement and insight appear normal. Mood & affect appropriate.     Data Reviewed: I have personally reviewed following labs and imaging studies  CBC:  Recent Labs Lab 10/10/16 1825  WBC 7.3  HGB 15.0  HCT 44.3  MCV 92.3  PLT 271   Basic Metabolic Panel:  Recent Labs Lab 10/10/16 1825  NA 139  K 3.6  CL 108  CO2 22  GLUCOSE 158*  BUN 10  CREATININE 0.85  CALCIUM 9.3   GFR: Estimated Creatinine Clearance: 98.4 mL/min (by C-G formula based on SCr of 0.85 mg/dL). Liver Function Tests: No results for input(s): AST, ALT, ALKPHOS, BILITOT, PROT, ALBUMIN in the last 168 hours. No results for input(s): LIPASE, AMYLASE in the last 168 hours. No results for input(s): AMMONIA in the last 168 hours. Coagulation Profile: No results for input(s): INR, PROTIME in the last 168 hours. Cardiac Enzymes:  Recent Labs Lab 10/10/16 2319 10/11/16 0450 10/11/16 0759  TROPONINI 0.07* 0.05* 0.06*   BNP (last 3 results) No results for input(s): PROBNP in the last 8760 hours. HbA1C: No results for input(s): HGBA1C in the last 72 hours. CBG: No results for input(s): GLUCAP in the last 168 hours. Lipid Profile: No results for input(s): CHOL, HDL, LDLCALC, TRIG, CHOLHDL, LDLDIRECT in the last 72 hours. Thyroid Function Tests: No results for input(s): TSH, T4TOTAL, FREET4, T3FREE, THYROIDAB in the last 72 hours. Anemia Panel: No results for input(s): VITAMINB12, FOLATE, FERRITIN,  TIBC, IRON, RETICCTPCT in the last 72 hours. Sepsis Labs: No results for input(s): PROCALCITON, LATICACIDVEN in the last 168 hours.  No results found for this or any previous visit (from the past 240 hour(s)).       Radiology Studies: Dg Chest 2 View  Result Date: 10/11/2016 CLINICAL DATA:  Chest pain for 1 week. EXAM: CHEST  2 VIEW COMPARISON:  Radiograph 09/26/2016 FINDINGS: The cardiomediastinal contours are normal. The lungs are clear.  Pulmonary vasculature is normal. No consolidation, pleural effusion, or pneumothorax. No acute osseous abnormalities are seen. IMPRESSION: No acute pulmonary process. Electronically Signed   By: Rubye Oaks M.D.   On: 10/11/2016 03:10        Scheduled Meds: . aspirin EC  81 mg Oral QPM  . atorvastatin  40 mg Oral QHS  . montelukast  10 mg Oral QHS   Continuous Infusions: . heparin 1,000 Units/hr (10/11/16 0209)     LOS: 0 days       Alwyn Ren, MD [facility]  If 7PM-7AM, please contact night-coverage [email or web address] Password [email or web address] 10/11/2016, 1:10 PM

## 2016-10-12 ENCOUNTER — Encounter (HOSPITAL_COMMUNITY): Payer: Self-pay | Admitting: Cardiovascular Disease

## 2016-10-12 DIAGNOSIS — Z720 Tobacco use: Secondary | ICD-10-CM

## 2016-10-12 DIAGNOSIS — I25110 Atherosclerotic heart disease of native coronary artery with unstable angina pectoris: Principal | ICD-10-CM

## 2016-10-12 DIAGNOSIS — E785 Hyperlipidemia, unspecified: Secondary | ICD-10-CM

## 2016-10-12 LAB — BASIC METABOLIC PANEL
Anion gap: 6 (ref 5–15)
BUN: 10 mg/dL (ref 6–20)
CHLORIDE: 108 mmol/L (ref 101–111)
CO2: 24 mmol/L (ref 22–32)
CREATININE: 0.84 mg/dL (ref 0.61–1.24)
Calcium: 8.6 mg/dL — ABNORMAL LOW (ref 8.9–10.3)
GFR calc Af Amer: >60 mL/min (ref >60)
GFR calc non Af Amer: >60 mL/min (ref >60)
Glucose, Bld: 113 mg/dL — ABNORMAL HIGH (ref 65–99)
Potassium: 3.6 mmol/L (ref 3.5–5.1)
SODIUM: 138 mmol/L (ref 135–145)

## 2016-10-12 LAB — CBC
HEMATOCRIT: 40.6 % (ref 39.0–52.0)
Hemoglobin: 13.7 g/dL (ref 13.0–17.0)
MCH: 31.1 pg (ref 26.0–34.0)
MCHC: 33.7 g/dL (ref 30.0–36.0)
MCV: 92.3 fL (ref 78.0–100.0)
Platelets: 268 10*3/uL (ref 150–400)
RBC: 4.4 MIL/uL (ref 4.22–5.81)
RDW: 13 % (ref 11.5–15.5)
WBC: 7.4 10*3/uL (ref 4.0–10.5)

## 2016-10-12 MED ORDER — TICAGRELOR 90 MG PO TABS: 90.0000 mg | ORAL_TABLET | Freq: Two times a day (BID) | ORAL | 11 refills | Status: DC

## 2016-10-12 MED ORDER — ATORVASTATIN CALCIUM 80 MG PO TABS: 80.0000 mg | ORAL_TABLET | Freq: Every day | ORAL | Status: DC

## 2016-10-12 MED ORDER — ATORVASTATIN CALCIUM 80 MG PO TABS: 80.0000 mg | ORAL_TABLET | Freq: Every day | ORAL | 0 refills | Status: DC

## 2016-10-12 MED ORDER — ALBUTEROL SULFATE (2.5 MG/3ML) 0.083% IN NEBU: 2.5000 mg | INHALATION_SOLUTION | RESPIRATORY_TRACT | Status: DC | PRN

## 2016-10-12 MED ORDER — TICAGRELOR 90 MG PO TABS: 90.0000 mg | ORAL_TABLET | Freq: Two times a day (BID) | ORAL | 1 refills | Status: DC

## 2016-10-12 NOTE — Progress Notes (Addendum)
Progress Note  Patient Name: Joseph Jacobs Date of Encounter: 10/12/2016  Primary Cardiologist: Hilty  Subjective   No angina or dyspnea. Walking briskly in the hall.  Inpatient Medications    Scheduled Meds:  angioplasty book   Does not apply Once   aspirin  81 mg Oral Daily   atorvastatin  40 mg Oral QHS   montelukast  10 mg Oral QHS   sodium chloride flush  3 mL Intravenous Q12H   ticagrelor  90 mg Oral BID   Continuous Infusions:  sodium chloride     PRN Meds: sodium chloride, acetaminophen, albuterol, ALPRAZolam, morphine injection, ondansetron (ZOFRAN) IV, sodium chloride flush   Vital Signs    Vitals:   10/12/16 0200 10/12/16 0400 10/12/16 0540 10/12/16 0744  BP: 104/74 107/65 114/75 114/88  Pulse: 71 65 71 71  Resp: Temp:  98.1 F (36.7 C)  98.3 F (36.8 C)  TempSrc:  Oral  Oral  SpO2: 93% 94% 94% 98%  Weight:      Height:        Intake/Output Summary (Last 24 hours) at 10/12/16 0826 Last data filed at 10/12/16 0600  Gross per 24 hour  Intake          1033.61 ml  Output                0 ml  Net          1033.61 ml   Filed Weights   10/11/16 0115 10/11/16 1830  Weight: 198 lb 6.6 oz (90 kg) 194 lb 14.2 oz (88.4 kg)    Telemetry    NSR - Personally Reviewed  ECG    NSR, normal - Personally Reviewed  Physical Exam  Calm, relaxed GEN: No acute distress.   Neck: No JVD Cardiac: RRR, no murmurs, rubs, or gallops. Healthy cath access site Respiratory: Clear to auscultation bilaterally. GI: Soft, nontender, non-distended  MS: No edema; No deformity. Neuro:  Nonfocal  Psych: Normal affect   Labs    Chemistry Recent Labs Lab 10/10/16 1825 10/12/16 0447  NA 139 138  K 3.6 3.6  CL 108 108  CO2 22 24  GLUCOSE 158* 113*  BUN 10 10  CREATININE 0.85 0.84  CALCIUM 9.3 8.6*  GFRNONAA >60 >60  GFRAA >60 >60  ANIONGAP 9 6     Hematology Recent Labs Lab 10/10/16 1825 10/12/16 0447  WBC 7.3 7.4  RBC 4.80  4.40  HGB 15.0 13.7  HCT 44.3 40.6  MCV 92.3 92.3  MCH 31.3 31.1  MCHC 33.9 33.7  RDW 12.5 13.0  PLT 271 268    Cardiac Enzymes Recent Labs Lab 10/10/16 2319 10/11/16 0450 10/11/16 0759 10/11/16 1850  TROPONINI 0.07* 0.05* 0.06* 0.09*    Recent Labs Lab 10/10/16 1836 10/10/16 2251  TROPIPOC 0.01 0.07     BNPNo results for input(s): BNP, PROBNP in the last 168 hours.   DDimer No results for input(s): DDIMER in the last 168 hours.   Radiology    Dg Chest 2 View  Result Date: 10/11/2016 CLINICAL DATA:  Chest pain for 1 week. EXAM: CHEST  2 VIEW COMPARISON:  Radiograph 09/26/2016 FINDINGS: The cardiomediastinal contours are normal. The lungs are clear. Pulmonary vasculature is normal. No consolidation, pleural effusion, or pneumothorax. No acute osseous abnormalities are seen. IMPRESSION: No acute pulmonary process. Electronically Signed   By: Rubye Oaks M.D.   On: 10/11/2016 03:10    Cardiac Studies  Diagnostic Diagram       Post-Intervention Diagram       Implants     Permanent Stent  Stent Synergy Des 3x16 - [id number] - Implanted    Inventory item: Stent Synergy Des 3x16 Model/Cat number: [id number]  Manufacturer: [employer] Lot number: [id number]  Device identifier: [id number] Device identifier type: GS1  GUDID Information   Request status Successful    Brand name: SYNERGY Version/Model: [id number]  Company name: [employer] MRI safety info as of 10/11/16: MR Conditional  Contains dry or latex rubber: No    GMDN P.T. name: Coronary angioplasty balloon catheter, basic         Patient Profile     61 y.o. male presenting with unstable angina now s/p successful PCI-DES to RCA.  Assessment & Plan    1. CAD s/p DES-RCA:  Reinforced need for uninterrupted DAPT for 6 months, continued for total of 12 months. Smoking cessation discussed. 2. HLP: increase atorvastatin to 80 mg daily and recheck in 3  months. May also need Zetia. 3. Migraines: on propranolol, avoid triptans.  DC home today.  For questions or updates, please contact [facility] Please consult [email or web address] for contact info under Cardiology/STEMI.      Signed, Thurmon Fair, MD  10/12/2016, 8:26 AM

## 2016-10-12 NOTE — Discharge Instructions (Addendum)
No driving for 2 days. No lifting over 5 lbs for 1 week. No sexual activity for 1 week. Keep procedure site clean & dry. If you notice increased pain, swelling, bleeding or pus, call/return!  You may shower, but no soaking baths/hot tubs/pools for 1 week.   

## 2016-10-12 NOTE — Care Management Note (Addendum)
Case Management Note  Patient Details  Name: Joseph Jacobs MRN: [id number] Date of Birth: 1955-04-14  Subjective/Objective:   From home, s/p coronary stent will be on brilinta, NCM awaiting benefit check.  NCM gave patient the $5 coupon for brilinta, NCM contacted the [employer] to see what cost is for brilinta , his co pay is 30.00.  But he will juse the coupon for $5 , and today he will go to [facility] on [address] to get his first 30 days.       CAROLYN @ [facility] # [phone]   BRILINTA 90 MG BID   COVER- YES  CO-PAY- $ 30.00  TIER- 2 DRUG  PRIOR APPROVAL NO  NO GENERIC  DEDUCTIBLE-NO   PREFERRED PHARMACY : [facility]  90 DAY SUPPLY AT RETAIL $ 90.00                 Action/Plan: NCM will follow for dc needs.  Expected Discharge Date:                  Expected Discharge Plan:  Home/Self Care  In-House Referral:     Discharge planning Services  CM Consult  Post Acute Care Choice:    Choice offered to:     DME Arranged:    DME Agency:     HH Arranged:    HH Agency:     Status of Service:  Completed, signed off  If discussed at [employer] of Stay Meetings, dates discussed:    Additional Comments:  Leone Haven, RN 10/12/2016, 8:57 AM

## 2016-10-12 NOTE — Progress Notes (Signed)
CARDIAC REHAB PHASE I   PRE:  Rate/Rhythm: 89 SR  BP:  Supine: 119/81  Sitting:   Standing:    SaO2:   MODE:  Ambulation: 650 ft   POST:  Rate/Rhythm: 102 ST  BP:  Supine:   Sitting: 135/76  Standing:    SaO2:  0810-0855 Pt walked 650 ft on RA with steady gait and no CP. Education completed with pt who voiced understanding. Stressed importance of brilinta with stent. Needs to see case manager. Reviewed NTG use, ex ed and smoking cessation. Gave fake cigarette and smoking cessation handout. Discussed CRP 2 and referring to [facility] program. Encouraged heart healthy diet and left handout.    Luetta Nutting, RN BSN  10/12/2016 8:52 AM

## 2016-10-12 NOTE — Progress Notes (Signed)
TR BAND REMOVAL  LOCATION:    R radial  DEFLATED PER PROTOCOL:    Yes  TIME BAND OFF / DRESSING APPLIED:    0210  SITE UPON ARRIVAL:    Level  0   SITE AFTER BAND REMOVAL:    Level 0  CIRCULATION SENSATION AND MOVEMENT:    Within Normal Limits   Yes  COMMENTS:   Pt tolerated procedure well. VSS. Post  TR Band removal instructions given.

## 2016-10-12 NOTE — Discharge Summary (Signed)
Physician Discharge Summary  Joseph Jacobs ZOX:[id number] DOB: [birth year] DOA: 10/10/2016  PCP: Monica Becton, MD  Admit date: 10/10/2016 Discharge date: 10/12/2016  Admitted From home Disposition: home  Recommendations for Outpatient Follow-up:  1. Follow up with PCP in 1-2 weeks 2. Please obtain BMP/CBC in one week Home Healthnone Equipment/Devices:none  Discharge Condition:stable CODE STATUS:full Diet recommendation:heart healthy  Brief/Interim Summary:61 yo male admitted with chest pain s/p DES-RCA.Started on brillinta.  Discharge Diagnoses:  Principal Problem:   Chest pain Active Problems:   Dyslipidemia   Tobacco abuse   Elevated troponin   Diastolic heart failure of unknown etiology (HCC)   Unstable angina pectoris (HCC)  Unstable angina s/p stent.  Discharge Instructions  Discharge Instructions    AMB Referral to Cardiac Rehabilitation - Phase II    Complete by:  As directed    Diagnosis:  Stable Angina   Amb Referral to Cardiac Rehabilitation    Complete by:  As directed    Diagnosis:  Coronary Stents     Allergies as of 10/12/2016      Reactions   E-mycin [erythromycin] Nausea Only   GI upset       Medication List    STOP taking these medications   ALEVE 220 MG tablet Generic drug:  naproxen sodium   cyclobenzaprine 10 MG tablet Commonly known as:  FLEXERIL   ibuprofen 200 MG tablet Commonly known as:  ADVIL,MOTRIN   meloxicam 15 MG tablet Commonly known as:  MOBIC     TAKE these medications   albuterol 108 (90 Base) MCG/ACT inhaler Commonly known as:  PROVENTIL HFA;VENTOLIN HFA Inhale two puffs every 4-6 hours only as needed for shortness of breath or wheezing. What changed:  how much to take  how to take this  when to take this  additional instructions   albuterol (2.5 MG/3ML) 0.083% nebulizer solution Commonly known as:  PROVENTIL Take 3 mLs (2.5 mg total) by nebulization every 6 (six) hours as needed for wheezing  or shortness of breath. What changed:  Another medication with the same name was changed. Make sure you understand how and when to take each.   aspirin EC 81 MG tablet Take 1 tablet (81 mg total) by mouth daily. What changed:  when to take this   atorvastatin 80 MG tablet Commonly known as:  LIPITOR Take 1 tablet (80 mg total) by mouth at bedtime. What changed:  medication strength  how much to take   metoprolol succinate 25 MG 24 hr tablet Commonly known as:  TOPROL XL Take 1 tablet (25 mg total) by mouth daily.   montelukast 10 MG tablet Commonly known as:  SINGULAIR Take 1 tablet (10 mg total) by mouth at bedtime.   nitroGLYCERIN 0.4 MG SL tablet Commonly known as:  NITROSTAT Place 1 tablet (0.4 mg total) under the tongue every 5 (five) minutes as needed for chest pain (or tightness).   ticagrelor 90 MG Tabs tablet Commonly known as:  BRILINTA Take 1 tablet (90 mg total) by mouth 2 (two) times daily.            Discharge Care Instructions        Start     Ordered   10/12/16 0000  Amb Referral to Cardiac Rehabilitation    Question:  Diagnosis:  Answer:  Coronary Stents   10/12/16 0851   10/12/16 0000  atorvastatin (LIPITOR) 80 MG tablet  Daily at bedtime     10/12/16 1021   10/12/16 0000  ticagrelor (  BRILINTA) 90 MG TABS tablet  2 times daily     10/12/16 1021   10/11/16 0000  AMB Referral to Cardiac Rehabilitation - Phase II    Question:  Diagnosis:  Answer:  Stable Angina   10/11/16 1745     Follow-up Information    Chrystie Nose, MD Follow up.   Specialty:  Cardiology Why:  The office will call. Contact information: [address] [place] [address] [phone]          Allergies  Allergen Reactions  . E-Mycin [Erythromycin] Nausea Only    GI upset     Consultations:   Procedures/Studies: Dg Chest 2 View  Result Date: 10/11/2016 CLINICAL DATA:  Chest pain for 1 week. EXAM: CHEST  2 VIEW COMPARISON:  Radiograph  09/26/2016 FINDINGS: The cardiomediastinal contours are normal. The lungs are clear. Pulmonary vasculature is normal. No consolidation, pleural effusion, or pneumothorax. No acute osseous abnormalities are seen. IMPRESSION: No acute pulmonary process. Electronically Signed   By: Rubye Oaks M.D.   On: 10/11/2016 03:10   Dg Chest 2 View  Result Date: 09/26/2016 CLINICAL DATA:  Substernal chest pain for 2-3 days, history asthma, smoking EXAM: CHEST  2 VIEW COMPARISON:  04/24/2016 FINDINGS: Normal heart size, mediastinal contours, and pulmonary vascularity. Lungs clear. No pleural effusion or pneumothorax. Bones unremarkable. IMPRESSION: Normal exam. Electronically Signed   By: Ulyses Southward M.D.   On: 09/26/2016 14:08      Subjective:better   Discharge Exam: Vitals:   10/12/16 0800 10/12/16 0900  BP:    Pulse:    Resp: 16 (!) 25  Temp:    SpO2:     Vitals:   10/12/16 0700 10/12/16 0744 10/12/16 0800 10/12/16 0900  BP:  114/88    Pulse:  71    Resp: 12  16 (!) 25  Temp:  98.3 F (36.8 C)    TempSrc:  Oral    SpO2:  98%    Weight:      Height:        General: Pt is alert, awake, not in acute distress Cardiovascular: RRR, S1/S2 +, no rubs, no gallops Respiratory: CTA bilaterally, no wheezing, no rhonchi Abdominal: Soft, NT, ND, bowel sounds + Extremities: no edema, no cyanosis    The results of significant diagnostics from this hospitalization (including imaging, microbiology, ancillary and laboratory) are listed below for reference.     Microbiology: No results found for this or any previous visit (from the past 240 hour(s)).   Labs: BNP (last 3 results) No results for input(s): BNP in the last 8760 hours. Basic Metabolic Panel:  Recent Labs Lab 10/10/16 1825 10/12/16 0447  NA 139 138  K 3.6 3.6  CL 108 108  CO2 22 24  GLUCOSE 158* 113*  BUN 10 10  CREATININE 0.85 0.84  CALCIUM 9.3 8.6*   Liver Function Tests: No results for input(s): AST, ALT,  ALKPHOS, BILITOT, PROT, ALBUMIN in the last 168 hours. No results for input(s): LIPASE, AMYLASE in the last 168 hours. No results for input(s): AMMONIA in the last 168 hours. CBC:  Recent Labs Lab 10/10/16 1825 10/12/16 0447  WBC 7.3 7.4  HGB 15.0 13.7  HCT 44.3 40.6  MCV 92.3 92.3  PLT 271 268   Cardiac Enzymes:  Recent Labs Lab 10/10/16 2319 10/11/16 0450 10/11/16 0759 10/11/16 1850  TROPONINI 0.07* 0.05* 0.06* 0.09*   BNP: Invalid input(s): POCBNP CBG:  Recent Labs Lab 10/11/16 1810  GLUCAP 85  D-Dimer No results for input(s): DDIMER in the last 72 hours. Hgb A1c No results for input(s): HGBA1C in the last 72 hours. Lipid Profile No results for input(s): CHOL, HDL, LDLCALC, TRIG, CHOLHDL, LDLDIRECT in the last 72 hours. Thyroid function studies No results for input(s): TSH, T4TOTAL, T3FREE, THYROIDAB in the last 72 hours.  Invalid input(s): FREET3 Anemia work up No results for input(s): VITAMINB12, FOLATE, FERRITIN, TIBC, IRON, RETICCTPCT in the last 72 hours. Urinalysis No results found for: COLORURINE, APPEARANCEUR, LABSPEC, PHURINE, GLUCOSEU, HGBUR, BILIRUBINUR, KETONESUR, PROTEINUR, UROBILINOGEN, NITRITE, LEUKOCYTESUR Sepsis Labs Invalid input(s): PROCALCITONIN,  WBC,  LACTICIDVEN Microbiology No results found for this or any previous visit (from the past 240 hour(s)).   Time coordinating discharge: Over 30 minutes  SIGNED:   Alwyn Ren, MD  [facility] 10/12/2016, 10:21 AM   If 7PM-7AM, please contact night-coverage [email or web address] Password [email or web address]

## 2016-10-15 ENCOUNTER — Ambulatory Visit (HOSPITAL_COMMUNITY): Payer: BC Managed Care – PPO

## 2016-10-16 ENCOUNTER — Telehealth: Payer: Self-pay | Admitting: Internal Medicine

## 2016-10-16 ENCOUNTER — Ambulatory Visit (INDEPENDENT_AMBULATORY_CARE_PROVIDER_SITE_OTHER): Payer: BC Managed Care – PPO | Admitting: Sports Medicine

## 2016-10-16 ENCOUNTER — Encounter: Payer: Self-pay | Admitting: Sports Medicine

## 2016-10-16 ENCOUNTER — Ambulatory Visit (INDEPENDENT_AMBULATORY_CARE_PROVIDER_SITE_OTHER): Payer: BC Managed Care – PPO

## 2016-10-16 DIAGNOSIS — Z87891 Personal history of nicotine dependence: Secondary | ICD-10-CM

## 2016-10-16 DIAGNOSIS — R0602 Shortness of breath: Secondary | ICD-10-CM

## 2016-10-16 DIAGNOSIS — Z955 Presence of coronary angioplasty implant and graft: Secondary | ICD-10-CM

## 2016-10-16 DIAGNOSIS — J449 Chronic obstructive pulmonary disease, unspecified: Secondary | ICD-10-CM | POA: Insufficient documentation

## 2016-10-16 DIAGNOSIS — I2510 Atherosclerotic heart disease of native coronary artery without angina pectoris: Secondary | ICD-10-CM | POA: Diagnosis not present

## 2016-10-16 NOTE — Progress Notes (Signed)
  Subjective:    CC: Hospital follow-up  HPI: Joseph Jacobs returns, he saw me with some vague chest discomfort, he did have some coronary calcifications on a CT done for another reason. Ultimately he developed increasing chest pain, was seen in the emergency department, troponins were mildly elevated, ended up having a cardiac catheterization with stenting of a 95% lesion in the right mid coronary artery. He was placed on Brilinta.  Overall he is feeling well, no chest pain, mild shortness of breath which can be from the antiplatelet agent. He is also a long-time smoker, recently quit. Has used bronchodilators in the past. No fevers, chills, night sweats, weight loss.  Past medical history:  Negative.  See flowsheet/record as well for more information.  Surgical history: Negative.  See flowsheet/record as well for more information.  Family history: Negative.  See flowsheet/record as well for more information.  Social history: Negative.  See flowsheet/record as well for more information.  Allergies, and medications have been entered into the medical record, reviewed, and no changes needed.   Review of Systems: No fevers, chills, night sweats, weight loss, chest pain, or shortness of breath.   Objective:    General: Well Developed, well nourished, and in no acute distress.  Neuro: Alert and oriented x3, extra-ocular muscles intact, sensation grossly intact.  HEENT: Normocephalic, atraumatic, pupils equal round reactive to light, neck supple, no masses, no lymphadenopathy, thyroid nonpalpable.  Skin: Warm and dry, no rashes. Cardiac: Regular rate and rhythm, no murmurs rubs or gallops, no lower extremity edema.  Respiratory: Mildly coarse sounds in the right upper lobe. Not using accessory muscles, speaking in full sentences.  Chest x-ray personally reviewed, no visible evidence of infiltrate.  Impression and Recommendations:    Presence of stent in coronary artery in patient with coronary  artery disease Recent NSTEMI with stenting of the right mid coronary. On Brilinta. Keep follow-up with cardiology, no further chest pain. Aggressive statin treatment.  Former smoker Patient has since quit smoking after his myocardial infarction.  Shortness of breath Recent hospitalization, Brilinta can cause some shortness of breath. Because he is a long-time smoker I do suspect that there is some element of mild COPD. He is a little bit coarse the right upper lobe today, adding a chest x-ray. I would like him to return later this week for pre-and postbronchodilator spirometry.  ___________________________________________ Joseph Jacobs. Benjamin Stain, M.D., ABFM., CAQSM. Primary Care and Sports Medicine [facility]  Adjunct Instructor of Family Medicine  [employer]

## 2016-10-16 NOTE — Telephone Encounter (Signed)
TCM Phone Call, Appt on 10/25/16 at 10am w/ Azalee Course . Thanks

## 2016-10-16 NOTE — Assessment & Plan Note (Signed)
Patient has since quit smoking after his myocardial infarction.

## 2016-10-16 NOTE — Assessment & Plan Note (Signed)
Recent NSTEMI with stenting of the right mid coronary. On Brilinta. Keep follow-up with cardiology, no further chest pain. Aggressive statin treatment.

## 2016-10-16 NOTE — Telephone Encounter (Signed)
Patient contacted regarding discharge from [facility] on 10/12/16.  Patient understands to follow up with provider Azalee Course, PA on September at 10:40 am at the [facility] office.. Patient understands discharge instructions? Yes Patient understands medications and regiment? Yes Patient understands to bring all medications to this visit? Yes  Patient stated that he is feeling better. He has had some shortness of breath. He saw his PCP and has been ordered some further tests to see if he needs to be on Advair. Patient confirmed his appointment with Azalee Course, PA.

## 2016-10-16 NOTE — Assessment & Plan Note (Signed)
Recent hospitalization, Brilinta can cause some shortness of breath. Because he is a long-time smoker I do suspect that there is some element of mild COPD. He is a little bit coarse the right upper lobe today, adding a chest x-ray. I would like him to return later this week for pre-and postbronchodilator spirometry.

## 2016-10-17 ENCOUNTER — Telehealth (HOSPITAL_COMMUNITY): Payer: Self-pay

## 2016-10-17 NOTE — Telephone Encounter (Signed)
Patient insurance is active and benefits verified. Patient insurance is [employer] - no co-payment, deductible $1250/$1250 has been met, out of pocket $4350/$1982.16 has been met, 20% co-insurance and no pre-authorization. Passport/reference [phone].

## 2016-10-18 ENCOUNTER — Encounter: Payer: Self-pay | Admitting: Sports Medicine

## 2016-10-18 ENCOUNTER — Ambulatory Visit (INDEPENDENT_AMBULATORY_CARE_PROVIDER_SITE_OTHER): Payer: BC Managed Care – PPO | Admitting: Sports Medicine

## 2016-10-18 DIAGNOSIS — J449 Chronic obstructive pulmonary disease, unspecified: Secondary | ICD-10-CM

## 2016-10-18 LAB — PULMONARY FUNCTION TEST

## 2016-10-18 MED ORDER — ALBUTEROL SULFATE HFA 108 (90 BASE) MCG/ACT IN AERS: 2.0000 | INHALATION_SPRAY | Freq: Four times a day (QID) | RESPIRATORY_TRACT | 3 refills | Status: DC | PRN

## 2016-10-18 NOTE — Assessment & Plan Note (Signed)
There is obstruction with mild reversibility on pre-and postbronchodilator spirometry. This is mixed COPD with a reversible component, because Brilinta can cause shortness of breath we are going to give it a couple months before considering the addition of a LABA/LAMA combo inhaler.

## 2016-10-18 NOTE — Progress Notes (Signed)
  Subjective:    CC: Spirometry  HPI: Joseph Jacobs returns, he is a long-time smoker, he recently had a non-ST elevation MI, with stenting of the right mid coronary artery. He did have some shortness of breath after being placed on Brilinta, sometimes this can cause temporary shortness of breath. We did obtain pre-and postbronchodilator spirometry due to his history of smoking and improvement with using Ventolin inhalers.  Past medical history:  Negative.  See flowsheet/record as well for more information.  Surgical history: Negative.  See flowsheet/record as well for more information.  Family history: Negative.  See flowsheet/record as well for more information.  Social history: Negative.  See flowsheet/record as well for more information.  Allergies, and medications have been entered into the medical record, reviewed, and no changes needed.   Review of Systems: No fevers, chills, night sweats, weight loss, chest pain, or shortness of breath.   Objective:    General: Well Developed, well nourished, and in no acute distress.  Neuro: Alert and oriented x3, extra-ocular muscles intact, sensation grossly intact.  HEENT: Normocephalic, atraumatic, pupils equal round reactive to light, neck supple, no masses, no lymphadenopathy, thyroid nonpalpable.  Skin: Warm and dry, no rashes. Cardiac: Regular rate and rhythm, no murmurs rubs or gallops, no lower extremity edema.  Respiratory: Clear to auscultation bilaterally. Not using accessory muscles, speaking in full sentences.  Pre-and postbronchodilator spirometry shows a relatively low FVC, FEV1, and FVC, with mild improvement after bronchodilator. This is consistent with mixed COPD with a reversible component.  Impression and Recommendations:    COPD mixed type (HCC) There is obstruction with mild reversibility on pre-and postbronchodilator spirometry. This is mixed COPD with a reversible component, because Brilinta can cause shortness of breath  we are going to give it a couple months before considering the addition of a LABA/LAMA combo inhaler.  I spent 40 minutes with this patient, greater than 50% was face-to-face time counseling regarding the above diagnoses, this was separate from the time spent performing the spirometry testing ___________________________________________ [name]. Benjamin Stain, M.D., ABFM., CAQSM. Primary Care and Sports Medicine [facility]  Adjunct Instructor of Family Medicine  [employer]

## 2016-10-19 ENCOUNTER — Encounter: Payer: Self-pay | Admitting: Sports Medicine

## 2016-10-22 MED ORDER — PREDNISONE 50 MG PO TABS: 50.0000 mg | ORAL_TABLET | Freq: Every day | ORAL | 0 refills | Status: DC

## 2016-10-22 MED ORDER — AZITHROMYCIN 250 MG PO TABS: ORAL_TABLET | ORAL | 0 refills | Status: DC

## 2016-10-22 MED ORDER — FLUTICASONE PROPIONATE 50 MCG/ACT NA SUSP: NASAL | 3 refills | Status: DC

## 2016-10-25 ENCOUNTER — Encounter: Payer: Self-pay | Admitting: Physician Assistant

## 2016-10-25 ENCOUNTER — Ambulatory Visit (INDEPENDENT_AMBULATORY_CARE_PROVIDER_SITE_OTHER): Payer: BC Managed Care – PPO | Admitting: Physician Assistant

## 2016-10-25 VITALS — BP 120/78 | HR 65 | Ht 71.0 in | Wt 194.8 lb

## 2016-10-25 DIAGNOSIS — I5030 Unspecified diastolic (congestive) heart failure: Secondary | ICD-10-CM

## 2016-10-25 DIAGNOSIS — Z955 Presence of coronary angioplasty implant and graft: Secondary | ICD-10-CM | POA: Diagnosis not present

## 2016-10-25 DIAGNOSIS — E785 Hyperlipidemia, unspecified: Secondary | ICD-10-CM | POA: Diagnosis not present

## 2016-10-25 DIAGNOSIS — Z79899 Other long term (current) drug therapy: Secondary | ICD-10-CM | POA: Diagnosis not present

## 2016-10-25 DIAGNOSIS — I2510 Atherosclerotic heart disease of native coronary artery without angina pectoris: Secondary | ICD-10-CM | POA: Diagnosis not present

## 2016-10-25 NOTE — Patient Instructions (Signed)
Medication Instructions:   No changes  Labwork:   Please obtain a fasting lipid profile (cholesterol) and hepatic (liver) function test in 6 to 8 weeks at your primary care physician's office  Testing/Procedures:  none  Follow-Up:  With Dr. Rennis Golden in 3 months  If you need a refill on your cardiac medications before your next appointment, please call your pharmacy.

## 2016-10-25 NOTE — Progress Notes (Signed)
Cardiology Office Note    Date:  10/27/2016   ID:  Joseph Jacobs, DOB [birth year], MRN [id number]  PCP:  Monica Becton, MD  Cardiologist:  Dr. Rennis Golden   Chief Complaint  Patient presents with  . Follow-up    seen for Dr. Rennis Golden    History of Present Illness:  Joseph Jacobs is a 61 y.o. male with PMH of CAD, HLD, and OSA. He has a history of tobacco abuse with quarter pack per day for the past 30 years. He has a history family history of diabetes but no family history of coronary artery disease. He also had a CT angiogram of the chest to rule out PE in June 2017, this demonstrated calcification in the LAD territory. Myoview in June 2017 showed normal LV function with no ischemia. He was recently seen by Dr. Rennis Golden on 09/27/2016 for evaluation of chest pain. Coronary CT with FFR was recommended. Unfortunately prior to the test being performed, he returned to the hospital with recurrent chest pain. On arrival to the ED, he had mildly elevated troponin which also appears to be flat. He eventually underwent cardiac catheterization on 10/11/2016 for definitive assessment which showed 95% mid RCA lesion treated with Synergy 3 x 16 mm DES, 30% proximal RCA, 50% ostial OM 2, 50% mid LAD lesion, EF 55-65%. Echocardiogram obtained on 10/11/2016 showed EF 60-65%, grade 2 DD.  He presents today for cardiology office visit. He has not had the same substernal angina he had in the hospital. He has been compliant with both aspirin and the Brilinta. Initially he did have some shortness of breath, this however has since resolved. He saw his primary care provider regarding the shortness of breath, chest x-ray was negative. He reportedly also had a spirometry over there as well. He is no longer having any exertional chest discomfort or shortness of breath at this point. He is feeling very well. The statin medication was recently increased. We will obtain a fasting lipid panel and LFTs in 6-8 weeks at PCPs  office. Otherwise, he denies any lower extremity edema, orthopnea or PND.   Past Medical History:  Diagnosis Date  . Anginal pain (HCC)   . Arthritis    "hands, knees, back, rightt foot" (10/11/2016)  . Asthma   . Chronic bronchitis (HCC)    "get it most years" (10/11/2016)  . Chronic lower back pain   . Coronary artery disease   . High cholesterol   . Lumbar degenerative disc disease 12/14/2013  . Migraine    "daily to weekly" (10/11/2016)  . Pneumonia    "several times; last time was in 03/2016" (10/11/2016)  . Sleep apnea   . Stress fracture of foot 12/14/2013   "right"    Past Surgical History:  Procedure Laterality Date  . BILATERAL CARPAL TUNNEL RELEASE Bilateral   . CORONARY ANGIOPLASTY WITH STENT PLACEMENT  10/11/2016  . CORONARY STENT INTERVENTION N/A 10/11/2016   Procedure: CORONARY STENT INTERVENTION;  Surgeon: Runell Gess, MD;  Location: [facility];  Service: Cardiovascular;  Laterality: N/A;  prox RCA  . KNEE ARTHROSCOPY Left 1978  . KNEE CARTILAGE SURGERY Left   . LEFT HEART CATH AND CORONARY ANGIOGRAPHY N/A 10/11/2016   Procedure: LEFT HEART CATH AND CORONARY ANGIOGRAPHY;  Surgeon: Runell Gess, MD;  Location: [facility];  Service: Cardiovascular;  Laterality: N/A;    Current Medications: Outpatient Medications Prior to Visit  Medication Sig Dispense Refill  . albuterol (PROVENTIL HFA;VENTOLIN HFA) 108 (  90 Base) MCG/ACT inhaler Inhale 2 puffs into the lungs every 6 (six) hours as needed for wheezing or shortness of breath. 1 Inhaler 3  . albuterol (PROVENTIL) (2.5 MG/3ML) 0.083% nebulizer solution Take 3 mLs (2.5 mg total) by nebulization every 6 (six) hours as needed for wheezing or shortness of breath. 75 mL 12  . aspirin EC 81 MG tablet Take 1 tablet (81 mg total) by mouth daily. (Patient taking differently: Take 81 mg by mouth every evening. )    . atorvastatin (LIPITOR) 80 MG tablet Take 1 tablet (80 mg total) by mouth at bedtime. 30  tablet 0  . azithromycin (ZITHROMAX Z-PAK) 250 MG tablet Take 2 tablets (500 mg) on  Day 1,  followed by 1 tablet (250 mg) once daily on Days 2 through 5. 6 tablet 0  . fluticasone (FLONASE) 50 MCG/ACT nasal spray One spray in each nostril twice a day, use left hand for right nostril, and right hand for left nostril. 48 g 3  . metoprolol succinate (TOPROL XL) 25 MG 24 hr tablet Take 1 tablet (25 mg total) by mouth daily. 90 tablet 3  . montelukast (SINGULAIR) 10 MG tablet Take 1 tablet (10 mg total) by mouth at bedtime. 90 tablet 3  . nitroGLYCERIN (NITROSTAT) 0.4 MG SL tablet Place 1 tablet (0.4 mg total) under the tongue every 5 (five) minutes as needed for chest pain (or tightness). 30 tablet 0  . ticagrelor (BRILINTA) 90 MG TABS tablet Take 1 tablet (90 mg total) by mouth 2 (two) times daily. 60 tablet 11  . predniSONE (DELTASONE) 50 MG tablet Take 1 tablet (50 mg total) by mouth daily. 5 tablet 0   No facility-administered medications prior to visit.      Allergies:   E-mycin [erythromycin]   Social History   Social History  . Marital status: Married    Spouse name: N/A  . Number of children: N/A  . Years of education: N/A   Social History Main Topics  . Smoking status: Former Smoker    Packs/day: 0.33    Years: 44.00    Types: Cigarettes  . Smokeless tobacco: Never Used  . Alcohol use 3.6 oz/week    3 Glasses of wine, 3 Standard drinks or equivalent per week  . Drug use: No  . Sexual activity: Not Currently    Partners: Female   Other Topics Concern  . None   Social History Narrative  . None     Family History:  The patient's family history includes Alcoholism in his mother; Brain cancer in his father; Cancer in his unknown relative; Diabetes in his mother; Lung cancer in his mother.   ROS:   Please see the history of present illness.    ROS All other systems reviewed and are negative.   PHYSICAL EXAM:   VS:  BP 120/78   Pulse 65   Ht  (1.803 m)   Wt  194 lb 12.8 oz (88.4 kg)   BMI 27.17 kg/m    GEN: Well nourished, well developed, in no acute distress  HEENT: normal  Neck: no JVD, carotid bruits, or masses Cardiac: RRR; no murmurs, rubs, or gallops,no edema  Respiratory:  clear to auscultation bilaterally, normal work of breathing GI: soft, nontender, nondistended, + BS MS: no deformity or atrophy  Skin: warm and dry, no rash Neuro:  Alert and Oriented x 3, Strength and sensation are intact Psych: euthymic mood, full affect  Wt Readings from Last 3 Encounters:  10/25/16 194 lb 12.8 oz (88.4 kg)  10/18/16 195 lb (88.5 kg)  10/16/16 196 lb (88.9 kg)      Studies/Labs Reviewed:   EKG:  EKG is ordered today.  The ekg ordered today demonstrates Normal sinus rhythm without significant ST-T wave changes.  Recent Labs: 09/26/2016: ALT 24; TSH 1.07 10/12/2016: BUN 10; Creatinine, Ser 0.84; Hemoglobin 13.7; Platelets 268; Potassium 3.6; Sodium 138   Lipid Panel    Component Value Date/Time   CHOL 185 09/26/2016 1149   TRIG 188 (H) 09/26/2016 1149   HDL 47 09/26/2016 1149   CHOLHDL 3.9 09/26/2016 1149   VLDL 36 (H) 07/06/2015 0939   LDLCALC 104 07/06/2015 0939    Additional studies/ records that were reviewed today include:   Cath 10/11/2016 Conclusion     Prox RCA lesion, 30 %stenosed.  Ost 2nd Mrg to 2nd Mrg lesion, 50 %stenosed.  Mid LAD lesion, 50 %stenosed.  Mid RCA lesion, 95 %stenosed.  Post intervention, there is a 0% residual stenosis.  A stent was successfully placed.  The left ventricular systolic function is normal.  LV end diastolic pressure is normal.  The left ventricular ejection fraction is 55-65% by visual estimate.     IMPRESSION:successful PCI and drug-eluting stenting of mid large dominant RCA stenosis using a synergy drug-eluting stent (3 mm x 16 mm) post dilating up to 3.4 mm. The patient has moderate to not significant disease in his left system. He has normal LV function. He'll remain  on antiplatelet therapy for a minimum of 12 months     Echo 10/11/2016 LV EF: 60% -   65%  Study Conclusions  - Left ventricle: The cavity size was normal. Wall thickness was   increased in a pattern of mild LVH. Systolic function was normal.   The estimated ejection fraction was in the range of 60% to 65%.   Wall motion was normal; there were no regional wall motion   abnormalities. Features are consistent with a pseudonormal left   ventricular filling pattern, with concomitant abnormal relaxation   and increased filling pressure (grade 2 diastolic dysfunction). - Aortic valve: Valve area (VTI): 1.5 cm^2. Valve area (Vmax): 1.13   cm^2. Valve area (Vmean): 1.29 cm^2. - Mitral valve: Valve area by pressure half-time: 1.17 cm^2   ASSESSMENT:    1. Presence of stent in coronary artery in patient with coronary artery disease   2. Diastolic heart failure of unknown etiology (HCC)   3. Dyslipidemia   4. Encounter for long-term (current) use of medications      PLAN:  In order of problems listed above:  1. CAD: Underwent DES to RCA, continue aspirin and Brilinta. On beta blocker. Denies any exertional chest discomfort or shortness of breath.  2. Hyperlipidemia: Lipitor recently increased, will obtain fasting lipid panel and LFTs in 6-8 weeks    Medication Adjustments/Labs and Tests Ordered: Current medicines are reviewed at length with the patient today.  Concerns regarding medicines are outlined above.  Medication changes, Labs and Tests ordered today are listed in the Patient Instructions below. Patient Instructions  Medication Instructions:   No changes  Labwork:   Please obtain a fasting lipid profile (cholesterol) and hepatic (liver) function test in 6 to 8 weeks at your primary care physician's office  Testing/Procedures:  none  Follow-Up:  With Dr. Rennis Golden in 3 months  If you need a refill on your cardiac medications before your next appointment, please call  your pharmacy.  Ramond Dial, [place]  10/27/2016 9:26 AM    [facility] [address] [place], [place], [place]  [address] Phone: [phone]; Fax: [phone]

## 2016-10-27 ENCOUNTER — Encounter: Payer: Self-pay | Admitting: Physician Assistant

## 2016-10-29 DIAGNOSIS — M19042 Primary osteoarthritis, left hand: Secondary | ICD-10-CM | POA: Insufficient documentation

## 2016-10-29 DIAGNOSIS — M19041 Primary osteoarthritis, right hand: Secondary | ICD-10-CM | POA: Insufficient documentation

## 2016-10-30 ENCOUNTER — Telehealth (HOSPITAL_COMMUNITY): Payer: Self-pay | Admitting: Pharmacist

## 2016-10-30 ENCOUNTER — Encounter: Payer: Self-pay | Admitting: Sports Medicine

## 2016-10-30 NOTE — Telephone Encounter (Signed)
Cardiac Rehab Medication Review by a Pharmacist  Does the patient feel that his/her medications are working for him/her?  yes  Has the patient been experiencing any side effects to the medications prescribed?  Yes - experienced some shortness of breath with Brilinta (especially after loading dose in hospital) but this has largely subsided  Does the patient measure his/her own blood pressure or blood glucose at home?  no   Does the patient have any problems obtaining medications due to transportation or finances?   No - fills Brilinta with coupon, $5 copay at [employer]  Understanding of regimen: good Understanding of indications: good Potential of compliance: good   Pharmacist comments: Mr. Sissel is a pleasant 61 y/o male who endorses compliance with all medications and denies any significant side effects. He has not required any nitroglycerin since stent placement. He does not monitor BP at home but reports he has never had an issue with this, in clinic SBP is ~115. Patient does report some swelling/tingling in lips/tongue that he believes may be due to recent course of azithromycin. No other medications were changed that would account for these symptoms, agree likely due to azithromycin.    Al Corpus, PharmD PGY1 Pharmacy Resident 10/30/2016 4:12 PM

## 2016-11-01 ENCOUNTER — Encounter (HOSPITAL_COMMUNITY)
Admission: RE | Admit: 2016-11-01 | Discharge: 2016-11-01 | Disposition: A | Discharge status: Home / Self Care | Payer: BC Managed Care – PPO | Source: Ambulatory Visit | Attending: Internal Medicine | Admitting: Internal Medicine

## 2016-11-01 ENCOUNTER — Encounter (HOSPITAL_COMMUNITY): Payer: Self-pay

## 2016-11-01 ENCOUNTER — Telehealth: Payer: Self-pay

## 2016-11-01 VITALS — BP 104/60 | HR 74 | Ht 69.75 in | Wt 201.7 lb

## 2016-11-01 DIAGNOSIS — Z48812 Encounter for surgical aftercare following surgery on the circulatory system: Secondary | ICD-10-CM | POA: Insufficient documentation

## 2016-11-01 DIAGNOSIS — Z955 Presence of coronary angioplasty implant and graft: Secondary | ICD-10-CM | POA: Insufficient documentation

## 2016-11-01 NOTE — Telephone Encounter (Signed)
Pt went to cardiologist with his cam walker on. Provider would like to know if you can write a letter with restrictions for stress tests if pt wears the boot. Please assist.

## 2016-11-01 NOTE — Progress Notes (Signed)
Cardiac Individual Treatment Plan  Patient Details  Name: Joseph Jacobs MRN: [id number] Date of Birth: [birth year] Referring Provider:     CARDIAC REHAB PHASE II ORIENTATION from 11/01/2016 in [facility] CARDIAC [facility]  Referring Provider  Dr Italy Hilty      Initial Encounter Date:    CARDIAC REHAB PHASE II ORIENTATION from 11/01/2016 in [facility] CARDIAC REHAB  Date  11/01/16  Referring Provider  Dr Italy Hilty      Visit Diagnosis: Status post coronary artery stent placement 10/12/16 DES RCA  Patient's Home Medications on Admission:  Current Outpatient Prescriptions:  .  acetaminophen (TYLENOL) 325 MG tablet, Take 650 mg by mouth every 6 (six) hours as needed., Disp: , Rfl:  .  albuterol (PROVENTIL HFA;VENTOLIN HFA) 108 (90 Base) MCG/ACT inhaler, Inhale 2 puffs into the lungs every 6 (six) hours as needed for wheezing or shortness of breath., Disp: 1 Inhaler, Rfl: 3 .  albuterol (PROVENTIL) (2.5 MG/3ML) 0.083% nebulizer solution, Take 3 mLs (2.5 mg total) by nebulization every 6 (six) hours as needed for wheezing or shortness of breath., Disp: 75 mL, Rfl: 12 .  aspirin EC 81 MG tablet, Take 1 tablet (81 mg total) by mouth daily. (Patient taking differently: Take 81 mg by mouth every evening. ), Disp: , Rfl:  .  atorvastatin (LIPITOR) 80 MG tablet, Take 1 tablet (80 mg total) by mouth at bedtime., Disp: 30 tablet, Rfl: 0 .  fluticasone (FLONASE) 50 MCG/ACT nasal spray, One spray in each nostril twice a day, use left hand for right nostril, and right hand for left nostril., Disp: 48 g, Rfl: 3 .  metoprolol succinate (TOPROL XL) 25 MG 24 hr tablet, Take 1 tablet (25 mg total) by mouth daily., Disp: 90 tablet, Rfl: 3 .  montelukast (SINGULAIR) 10 MG tablet, Take 1 tablet (10 mg total) by mouth at bedtime., Disp: 90 tablet, Rfl: 3 .  nitroGLYCERIN (NITROSTAT) 0.4 MG SL tablet, Place 1 tablet (0.4 mg total) under the tongue every 5 (five) minutes as needed  for chest pain (or tightness)., Disp: 30 tablet, Rfl: 0 .  ticagrelor (BRILINTA) 90 MG TABS tablet, Take 1 tablet (90 mg total) by mouth 2 (two) times daily., Disp: 60 tablet, Rfl: 11  Past Medical History: Past Medical History:  Diagnosis Date  . Anginal pain (HCC)   . Arthritis    "hands, knees, back, rightt foot" (10/11/2016)  . Asthma   . Chronic bronchitis (HCC)    "get it most years" (10/11/2016)  . Chronic lower back pain   . Coronary artery disease   . High cholesterol   . Lumbar degenerative disc disease 12/14/2013  . Migraine    "daily to weekly" (10/11/2016)  . Pneumonia    "several times; last time was in 03/2016" (10/11/2016)  . Sleep apnea   . Stress fracture of foot 12/14/2013   "right"    Tobacco Use: History  Smoking Status  . Former Smoker  . Packs/day: 0.33  . Years: 44.00  . Types: Cigarettes  Smokeless Tobacco  . Never Used    Labs: Recent Review Flowsheet Data    Labs for ITP Cardiac and Pulmonary Rehab Latest Ref Rng & Units 07/06/2015 09/26/2016   Cholestrol <200 mg/dL 409 811   LDLCALC <914 mg/dL 782 -   HDL >95 mg/dL 60 47   Trlycerides <621 mg/dL 308(M) 578(I)   Hemoglobin A1c <5.7 % - 5.9(H)      Capillary Blood Glucose: Lab  Results  Component Value Date   GLUCAP 85 10/11/2016     Exercise Target Goals: Date: 11/01/16  Exercise Program Goal: Individual exercise prescription set with THRR, safety & activity barriers. Participant demonstrates ability to understand and report RPE using BORG scale, to self-measure pulse accurately, and to acknowledge the importance of the exercise prescription.  Exercise Prescription Goal: Starting with aerobic activity 30 plus minutes a day, 3 days per week for initial exercise prescription. Provide home exercise prescription and guidelines that participant acknowledges understanding prior to discharge.  Activity Barriers & Risk Stratification:     Activity Barriers & Cardiac Risk Stratification -  11/01/16 1434      Activity Barriers & Cardiac Risk Stratification   Activity Barriers Arthritis;Back Problems;Other (comment)   Comments stress fracture in R foot, arthritis in hands, B knee scope   Cardiac Risk Stratification High      6 Minute Walk:     6 Minute Walk    Row Name 11/01/16 1552         6 Minute Walk   Phase Initial     Distance 1704 feet     Walk Time 6 minutes     # of Rest Breaks 0     MPH 3.22     METS 4.05     RPE 12     VO2 Peak 14.2     Symptoms Yes (comment)     Comments pt c/o dizziness and lightheadedness at the end of walk test     Resting HR 66 bpm     Resting BP 104/60     Resting Oxygen Saturation  97 %     Exercise Oxygen Saturation  during 6 min walk 98 %     Max Ex. HR 102 bpm     Max Ex. BP 124/72     2 Minute Post BP 102/60        Oxygen Initial Assessment:   Oxygen Re-Evaluation:   Oxygen Discharge (Final Oxygen Re-Evaluation):   Initial Exercise Prescription:     Initial Exercise Prescription - 11/01/16 1500      Date of Initial Exercise RX and Referring Provider   Date 11/01/16   Referring Provider Dr Italy Hilty     Treadmill   MPH 3   Grade 0   Minutes 10   METs 3.3     Bike   Level 1   Minutes 10   METs 3.1     NuStep   Level 3   SPM 80   Minutes 10   METs 2.5     Prescription Details   Frequency (times per week) 3   Duration Progress to 30 minutes of continuous aerobic without signs/symptoms of physical distress     Intensity   THRR 40-80% of Max Heartrate 64-128   Ratings of Perceived Exertion 11-13   Perceived Dyspnea 0-4     Progression   Progression Continue to progress workloads to maintain intensity without signs/symptoms of physical distress.     Resistance Training   Training Prescription Yes   Weight 4lbs   Reps 10-15      Perform Capillary Blood Glucose checks as needed.  Exercise Prescription Changes:   Exercise Comments:   Exercise Goals and Review:       Exercise Goals    Row Name 11/01/16 1434             Exercise Goals   Increase Physical Activity Yes  Intervention Provide advice, education, support and counseling about physical activity/exercise needs.;Develop an individualized exercise prescription for aerobic and resistive training based on initial evaluation findings, risk stratification, comorbidities and participant's personal goals.       Expected Outcomes Achievement of increased cardiorespiratory fitness and enhanced flexibility, muscular endurance and strength shown through measurements of functional capacity and personal statement of participant.       Increase Strength and Stamina Yes  improve endurance to continue to play golf       Intervention Provide advice, education, support and counseling about physical activity/exercise needs.;Develop an individualized exercise prescription for aerobic and resistive training based on initial evaluation findings, risk stratification, comorbidities and participant's personal goals.       Expected Outcomes Achievement of increased cardiorespiratory fitness and enhanced flexibility, muscular endurance and strength shown through measurements of functional capacity and personal statement of participant.       Able to understand and use rate of perceived exertion (RPE) scale Yes       Intervention Provide education and explanation on how to use RPE scale       Expected Outcomes Short Term: Able to use RPE daily in rehab to express subjective intensity level;Long Term:  Able to use RPE to guide intensity level when exercising independently       Knowledge and understanding of Target Heart Rate Range (THRR) Yes       Intervention Provide education and explanation of THRR including how the numbers were predicted and where they are located for reference       Expected Outcomes Short Term: Able to state/look up THRR;Long Term: Able to use THRR to govern intensity when exercising  independently;Short Term: Able to use daily as guideline for intensity in rehab       Able to check pulse independently Yes       Intervention Provide education and demonstration on how to check pulse in carotid and radial arteries.;Review the importance of being able to check your own pulse for safety during independent exercise       Expected Outcomes Short Term: Able to explain why pulse checking is important during independent exercise;Long Term: Able to check pulse independently and accurately       Understanding of Exercise Prescription Yes       Intervention Provide education, explanation, and written materials on patient's individual exercise prescription       Expected Outcomes Short Term: Able to explain program exercise prescription;Long Term: Able to explain home exercise prescription to exercise independently          Exercise Goals Re-Evaluation :    Discharge Exercise Prescription (Final Exercise Prescription Changes):   Nutrition:  Target Goals: Understanding of nutrition guidelines, daily intake of sodium 1500mg , cholesterol 200mg , calories 30% from fat and 7% or less from saturated fats, daily to have 5 or more servings of fruits and vegetables.  Biometrics:     Pre Biometrics - 11/01/16 1628      Pre Biometrics   Height 5' 9.75" (1.772 m)   Weight 201 lb 11.5 oz (91.5 kg)   Waist Circumference 40.5 inches   Hip Circumference 41 inches   Waist to Hip Ratio 0.99 %   BMI (Calculated) 29.14   Triceps Skinfold 20 mm   % Body Fat 29 %   Grip Strength 46 kg   Flexibility 0 in   Single Leg Stand 30 seconds       Nutrition Therapy Plan and Nutrition Goals:   Nutrition  Discharge: Nutrition Scores:   Nutrition Goals Re-Evaluation:   Nutrition Goals Re-Evaluation:   Nutrition Goals Discharge (Final Nutrition Goals Re-Evaluation):   Psychosocial: Target Goals: Acknowledge presence or absence of significant depression and/or stress, maximize coping  skills, provide positive support system. Participant is able to verbalize types and ability to use techniques and skills needed for reducing stress and depression.  Initial Review & Psychosocial Screening:     Initial Psych Review & Screening - 11/01/16 1624      Initial Review   Current issues with None Identified     Family Dynamics   Good Support System? Yes     Barriers   Psychosocial barriers to participate in program There are no identifiable barriers or psychosocial needs.     Screening Interventions   Interventions Encouraged to exercise      Quality of Life Scores:     Quality of Life - 11/01/16 1603      Quality of Life Scores   Health/Function Pre 21.57 %   Socioeconomic Pre 26.43 %   Psych/Spiritual Pre 25 %   Family Pre 28.8 %   GLOBAL Pre 24.34 %      PHQ-9: Recent Review Flowsheet Data    Depression screen [facility] 2/9 10/16/2016 09/03/2016   Decreased Interest 1 0   Down, Depressed, Hopeless 1 0   PHQ - 2 Score 2 0   Altered sleeping 0 -   Tired, decreased energy 2 -   Change in appetite 0 -   Feeling bad or failure about yourself  0 -   Trouble concentrating 2 -   Moving slowly or fidgety/restless 2 -   Suicidal thoughts 0 -   PHQ-9 Score 8 -     Interpretation of Total Score  Total Score Depression Severity:  1-4 = Minimal depression, 5-9 = Mild depression, 10-14 = Moderate depression, 15-19 = Moderately severe depression, 20-27 = Severe depression   Psychosocial Evaluation and Intervention:   Psychosocial Re-Evaluation:   Psychosocial Discharge (Final Psychosocial Re-Evaluation):   Vocational Rehabilitation: Provide vocational rehab assistance to qualifying candidates.   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 Participant will state understanding/return demonstration of topics presented.  Learning Barriers/Preferences:     Learning Barriers/Preferences - 11/01/16 1433      Learning Barriers/Preferences   Learning Barriers Sight   Learning Preferences Video;Pictoral;Computer/Internet      Education Topics: Count Your Pulse:  -Group instruction provided by verbal instruction, demonstration, patient participation and written materials to support subject.  Instructors address importance of being able to find your pulse and how to count your pulse when at home without a heart monitor.  Patients get hands on experience counting their pulse with staff help and individually.   Heart Attack, Angina, and Risk Factor Modification:  -Group instruction provided by verbal instruction, video, and written materials to support subject.  Instructors address signs and symptoms of angina and heart attacks.    Also discuss risk factors for heart disease and how to make changes to improve heart health risk factors.   Functional Fitness:  -Group instruction provided by verbal instruction, demonstration, patient participation, and written materials to support  subject.  Instructors address safety measures for doing things around the house.  Discuss how to get up and down off the floor, how to pick things up properly, how to safely get out of a chair without assistance, and balance training.   Meditation and Mindfulness:  -Group instruction provided by verbal instruction, patient participation, and written materials to support subject.  Instructor addresses importance of mindfulness and meditation practice to help reduce stress and improve awareness.  Instructor also leads participants through a meditation exercise.    Stretching for Flexibility and Mobility:  -Group instruction provided by verbal  instruction, patient participation, and written materials to support subject.  Instructors lead participants through series of stretches that are designed to increase flexibility thus improving mobility.  These stretches are additional exercise for major muscle groups that are typically performed during regular warm up and cool down.   Hands Only CPR:  -Group verbal, video, and participation provides a basic overview of AHA guidelines for community CPR. Role-play of emergencies allow participants the opportunity to practice calling for help and chest compression technique with discussion of AED use.   Hypertension: -Group verbal and written instruction that provides a basic overview of hypertension including the most recent diagnostic guidelines, risk factor reduction with self-care instructions and medication management.    Nutrition I class: Heart Healthy Eating:  -Group instruction provided by PowerPoint slides, verbal discussion, and written materials to support subject matter. The instructor gives an explanation and review of the Therapeutic Lifestyle Changes diet recommendations, which includes a discussion on lipid goals, dietary fat, sodium, fiber, plant stanol/sterol esters, sugar, and the components of a well-balanced, healthy diet.   Nutrition II class: Lifestyle Skills:  -Group instruction provided by PowerPoint slides, verbal discussion, and written materials to support subject matter. The instructor gives an explanation and review of label reading, grocery shopping for heart health, heart healthy recipe modifications, and ways to make healthier choices when eating out.   Diabetes Question & Answer:  -Group instruction provided by PowerPoint slides, verbal discussion, and written materials to support subject matter. The instructor gives an explanation and review of diabetes co-morbidities, pre- and post-prandial blood glucose goals, pre-exercise blood glucose goals, signs, symptoms,  and treatment of hypoglycemia and hyperglycemia, and foot care basics.   Diabetes Blitz:  -Group instruction provided by PowerPoint slides, verbal discussion, and written materials to support subject matter. The instructor gives an explanation and review of the physiology behind type 1 and type 2 diabetes, diabetes medications and rational behind using different medications, pre- and post-prandial blood glucose recommendations and Hemoglobin A1c goals, diabetes diet, and exercise including blood glucose guidelines for exercising safely.    Portion Distortion:  -Group instruction provided by PowerPoint slides, verbal discussion, written materials, and food models to support subject matter. The instructor gives an explanation of serving size versus portion size, changes in portions sizes over the last 20 years, and what consists of a serving from each food group.   Stress Management:  -Group instruction provided by verbal instruction, video, and written materials to support subject matter.  Instructors review role of stress in heart disease and how to cope with stress positively.     Exercising on Your Own:  -Group instruction provided by verbal instruction, power point, and written materials to support subject.  Instructors discuss benefits of exercise, components of exercise, frequency and intensity of exercise, and end points for exercise.  Also discuss use of nitroglycerin and activating EMS.  Review options of places to  exercise outside of rehab.  Review guidelines for sex with heart disease.   Cardiac Drugs I:  -Group instruction provided by verbal instruction and written materials to support subject.  Instructor reviews cardiac drug classes: antiplatelets, anticoagulants, beta blockers, and statins.  Instructor discusses reasons, side effects, and lifestyle considerations for each drug class.   Cardiac Drugs II:  -Group instruction provided by verbal instruction and written materials to  support subject.  Instructor reviews cardiac drug classes: angiotensin converting enzyme inhibitors (ACE-I), angiotensin II receptor blockers (ARBs), nitrates, and calcium channel blockers.  Instructor discusses reasons, side effects, and lifestyle considerations for each drug class.   Anatomy and Physiology of the Circulatory System:  Group verbal and written instruction and models provide basic cardiac anatomy and physiology, with the coronary electrical and arterial systems. Review of: AMI, Angina, Valve disease, Heart Failure, Peripheral Artery Disease, Cardiac Arrhythmia, Pacemakers, and the ICD.   Other Education:  -Group or individual verbal, written, or video instructions that support the educational goals of the cardiac rehab program.   Knowledge Questionnaire Score:     Knowledge Questionnaire Score - 11/01/16 1551      Knowledge Questionnaire Score   Pre Score 24/28      Core Components/Risk Factors/Patient Goals at Admission:     Personal Goals and Risk Factors at Admission - 11/01/16 1629      Core Components/Risk Factors/Patient Goals on Admission    Weight Management Yes;Weight Loss;Weight Maintenance   Intervention Weight Management: Provide education and appropriate resources to help participant work on and attain dietary goals.;Weight Management: Develop a combined nutrition and exercise program designed to reach desired caloric intake, while maintaining appropriate intake of nutrient and fiber, sodium and fats, and appropriate energy expenditure required for the weight goal.;Weight Management/Obesity: Establish reasonable short term and long term weight goals.   Expected Outcomes Short Term: Continue to assess and modify interventions until short term weight is achieved;Weight Loss: Understanding of general recommendations for a balanced deficit meal plan, which promotes 1-2 lb weight loss per week and includes a negative energy balance of [phone] kcal/d;Weight  Maintenance: Understanding of the daily nutrition guidelines, which includes 25-35% calories from fat, 7% or less cal from saturated fats, less than 200mg  cholesterol, less than 1.5gm of sodium, & 5 or more servings of fruits and vegetables daily;Long Term: Adherence to nutrition and physical activity/exercise program aimed toward attainment of established weight goal;Understanding recommendations for meals to include 15-35% energy as protein, 25-35% energy from fat, 35-60% energy from carbohydrates, less than 200mg  of dietary cholesterol, 20-35 gm of total fiber daily;Understanding of distribution of calorie intake throughout the day with the consumption of 4-5 meals/snacks   Tobacco Cessation Yes   Number of packs per day quit 10/11/16  smoked for 30 years at least 0.25 ppd   Intervention Assist the participant in steps to quit. Provide individualized education and counseling about committing to Tobacco Cessation, relapse prevention, and pharmacological support that can be provided by physician.;Education officer, environmental, assist with locating and accessing local/national Quit Smoking programs, and support quit date choice.   Expected Outcomes Short Term: Will demonstrate readiness to quit, by selecting a quit date.;Short Term: Will quit all tobacco product use, adhering to prevention of relapse plan.;Long Term: Complete abstinence from all tobacco products for at least 12 months from quit date.   Lipids Yes   Intervention Provide education and support for participant on nutrition & aerobic/resistive exercise along with prescribed medications to achieve LDL 70mg , HDL >40mg .  Expected Outcomes Short Term: Participant states understanding of desired cholesterol values and is compliant with medications prescribed. Participant is following exercise prescription and nutrition guidelines.;Long Term: Cholesterol controlled with medications as prescribed, with individualized exercise RX and with personalized  nutrition plan. Value goals: LDL < , HDL > 40 mg.   Stress Yes   Intervention Refer participants experiencing significant psychosocial distress to appropriate mental health specialists for further evaluation and treatment. When possible, include family members and significant others in education/counseling sessions.      Core Components/Risk Factors/Patient Goals Review:    Core Components/Risk Factors/Patient Goals at Discharge (Final Review):    ITP Comments:     ITP Comments    Row Name 11/01/16 1419           ITP Comments Dr. Armanda Magic, Medical Director          Comments: Joseph Jacobs attended orientation from 1330 to 1530 to review rules and guidelines for program. Completed 6 minute walk test, Intitial ITP, and exercise prescription.  VSS. Telemetry-Sinus Rhythm with arrythmia.  Joseph Jacobs wore a boot today for a stress fracture he has in his right foot. Received permission from Dr Benjamin Stain for Mr Reichart to participate in phase 2 cardiac rehab without restrictions. Joseph Jacobs reported feeling a little lightheaded after his walk test. Joseph Jacobs only drank coffee today and did not drink any water. Joseph Jacobs drank a cup of water. Initial post exercise blood pressure 102/60. Recheck blood pressure 123/79 sitting. Standing blood pressure 136/86. Joseph Jacobs had no further complaints or symptoms after he drank the cup of water.Will continue to monitor the patient throughout  the program.Maria Harlon Flor, RN,BSN 11/01/2016 4:40 PM

## 2016-11-01 NOTE — Telephone Encounter (Signed)
He has no restrictions with the boot, and in fact really does not need to be wearing it all the time.

## 2016-11-02 ENCOUNTER — Encounter: Payer: Self-pay | Admitting: Sports Medicine

## 2016-11-02 ENCOUNTER — Ambulatory Visit (INDEPENDENT_AMBULATORY_CARE_PROVIDER_SITE_OTHER): Payer: BC Managed Care – PPO | Admitting: Sports Medicine

## 2016-11-02 DIAGNOSIS — I2510 Atherosclerotic heart disease of native coronary artery without angina pectoris: Secondary | ICD-10-CM

## 2016-11-02 DIAGNOSIS — R079 Chest pain, unspecified: Secondary | ICD-10-CM | POA: Diagnosis not present

## 2016-11-02 DIAGNOSIS — Z955 Presence of coronary angioplasty implant and graft: Secondary | ICD-10-CM | POA: Diagnosis not present

## 2016-11-02 DIAGNOSIS — R002 Palpitations: Secondary | ICD-10-CM | POA: Diagnosis not present

## 2016-11-02 MED ORDER — NITROGLYCERIN 0.4 MG SL SUBL: 0.4000 mg | SUBLINGUAL_TABLET | SUBLINGUAL | 0 refills | Status: DC | PRN

## 2016-11-02 NOTE — Assessment & Plan Note (Signed)
Doing well after his stent. Unfortunately has occasional episodes of trouble catching his breath, on further questioning he does get some skipped beats and flutters in his chest. These are intermittent, he's probably having PACs or PVCs, he is also been under a great deal of anxiety regarding his job. The key would benefit from an event monitor before bumping up his dose of metoprolol. If event monitoring is completely unremarkable we will consider the addition of an anti-anxiety agent. 

## 2016-11-02 NOTE — Assessment & Plan Note (Signed)
Doing well after his stent. Unfortunately has occasional episodes of trouble catching his breath, on further questioning he does get some skipped beats and flutters in his chest. These are intermittent, he's probably having PACs or PVCs, he is also been under a great deal of anxiety regarding his job. The key would benefit from an event monitor before bumping up his dose of metoprolol. If event monitoring is completely unremarkable we will consider the addition of an anti-anxiety agent.

## 2016-11-02 NOTE — Progress Notes (Signed)
  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 Initially these resolved, PFTs showed mild COPD. More recently he's had a recurrence, as well as a very stressful week. Typically gets episodes of difficulty catching his breath that are intermittent, temporary, and associated with cardiac palpitations. No further chest pain.  Past medical history:  Negative.  See flowsheet/record as well for more information.  Surgical history: Negative.  See flowsheet/record as well for more information.  Family history: Negative.  See flowsheet/record as well for more information.  Social history: Negative.  See flowsheet/record as well for more information.  Allergies, and medications have been entered into the medical record, reviewed, and no changes needed.   Review of Systems: No fevers, chills, night sweats, weight loss, chest pain, or shortness of breath.   Objective:    General: Well Developed, well nourished, and in no acute distress.  Neuro: Alert and oriented x3, extra-ocular muscles intact, sensation grossly intact.  HEENT: Normocephalic, atraumatic, pupils equal round reactive to light, neck supple, no masses, no lymphadenopathy, thyroid nonpalpable.  Skin: Warm and dry, no rashes. Cardiac: Regular rate and rhythm, no murmurs rubs or gallops, no lower extremity edema.  Respiratory: Clear to auscultation bilaterally. Not using accessory muscles, speaking in full sentences.  Impression and Recommendations:    Presence of stent in coronary artery in patient with coronary artery disease Doing well after his stent. Unfortunately has occasional episodes of trouble catching his breath, on further questioning he does get some skipped beats and flutters in his chest. These are intermittent, he's probably having PACs or PVCs, he is also been  under a great deal of anxiety regarding his job. The key would benefit from an event monitor before bumping up his dose of metoprolol. If event monitoring is completely unremarkable we will consider the addition of an anti-anxiety agent.  Palpitations Doing well after his stent. Unfortunately has occasional episodes of trouble catching his breath, on further questioning he does get some skipped beats and flutters in his chest. These are intermittent, he's probably having PACs or PVCs, he is also been under a great deal of anxiety regarding his job. The key would benefit from an event monitor before bumping up his dose of metoprolol. If event monitoring is completely unremarkable we will consider the addition of an anti-anxiety agent.  I spent 25 minutes with this patient, greater than 50% was face-to-face time counseling regarding the above diagnoses ___________________________________________ [name]. Benjamin Stain, M.D., ABFM., CAQSM. Primary Care and Sports Medicine [facility]  Adjunct Instructor of Family Medicine  [employer]

## 2016-11-06 ENCOUNTER — Encounter (HOSPITAL_COMMUNITY): Payer: Self-pay | Admitting: *Deleted

## 2016-11-06 DIAGNOSIS — Z955 Presence of coronary angioplasty implant and graft: Secondary | ICD-10-CM

## 2016-11-06 NOTE — Progress Notes (Signed)
Cardiac Individual Treatment Plan  Patient Details  Name: Joseph Jacobs MRN: [id number] Date of Birth: [birth year] Referring Provider:     CARDIAC REHAB PHASE II ORIENTATION from 11/01/2016 in [facility] CARDIAC [facility]  Referring Provider  Dr Italy Hilty      Initial Encounter Date:    CARDIAC REHAB PHASE II ORIENTATION from 11/01/2016 in [facility] CARDIAC REHAB  Date  11/01/16  Referring Provider  Dr Italy Hilty      Visit Diagnosis: Status post coronary artery stent placement 10/12/16 DES RCA  Patient's Home Medications on Admission:  Current Outpatient Prescriptions:  .  acetaminophen (TYLENOL) 325 MG tablet, Take 650 mg by mouth every 6 (six) hours as needed., Disp: , Rfl:  .  albuterol (PROVENTIL HFA;VENTOLIN HFA) 108 (90 Base) MCG/ACT inhaler, Inhale 2 puffs into the lungs every 6 (six) hours as needed for wheezing or shortness of breath., Disp: 1 Inhaler, Rfl: 3 .  albuterol (PROVENTIL) (2.5 MG/3ML) 0.083% nebulizer solution, Take 3 mLs (2.5 mg total) by nebulization every 6 (six) hours as needed for wheezing or shortness of breath., Disp: 75 mL, Rfl: 12 .  aspirin EC 81 MG tablet, Take 1 tablet (81 mg total) by mouth daily. (Patient taking differently: Take 81 mg by mouth every evening. ), Disp: , Rfl:  .  atorvastatin (LIPITOR) 80 MG tablet, Take 1 tablet (80 mg total) by mouth at bedtime., Disp: 30 tablet, Rfl: 0 .  fluticasone (FLONASE) 50 MCG/ACT nasal spray, One spray in each nostril twice a day, use left hand for right nostril, and right hand for left nostril., Disp: 48 g, Rfl: 3 .  metoprolol succinate (TOPROL XL) 25 MG 24 hr tablet, Take 1 tablet (25 mg total) by mouth daily., Disp: 90 tablet, Rfl: 3 .  montelukast (SINGULAIR) 10 MG tablet, Take 1 tablet (10 mg total) by mouth at bedtime., Disp: 90 tablet, Rfl: 3 .  nitroGLYCERIN (NITROSTAT) 0.4 MG SL tablet, Place 1 tablet (0.4 mg total) under the tongue every 5 (five) minutes as needed  for chest pain (or tightness)., Disp: 30 tablet, Rfl: 0 .  ticagrelor (BRILINTA) 90 MG TABS tablet, Take 1 tablet (90 mg total) by mouth 2 (two) times daily., Disp: 60 tablet, Rfl: 11  Past Medical History: Past Medical History:  Diagnosis Date  . Anginal pain (HCC)   . Arthritis    "hands, knees, back, rightt foot" (10/11/2016)  . Asthma   . Chronic bronchitis (HCC)    "get it most years" (10/11/2016)  . Chronic lower back pain   . Coronary artery disease   . High cholesterol   . Lumbar degenerative disc disease 12/14/2013  . Migraine    "daily to weekly" (10/11/2016)  . Pneumonia    "several times; last time was in 03/2016" (10/11/2016)  . Sleep apnea   . Stress fracture of foot 12/14/2013   "right"    Tobacco Use: History  Smoking Status  . Former Smoker  . Packs/day: 0.33  . Years: 44.00  . Types: Cigarettes  Smokeless Tobacco  . Never Used    Labs: Recent Review Flowsheet Data    Labs for ITP Cardiac and Pulmonary Rehab Latest Ref Rng & Units 07/06/2015 09/26/2016   Cholestrol <200 mg/dL 409 811   LDLCALC <914 mg/dL 782 -   HDL >95 mg/dL 60 47   Trlycerides <621 mg/dL 308(M) 578(I)   Hemoglobin A1c <5.7 % - 5.9(H)      Capillary Blood Glucose: Lab  Results  Component Value Date   GLUCAP 85 10/11/2016     Exercise Target Goals:    Exercise Program Goal: Individual exercise prescription set with THRR, safety & activity barriers. Participant demonstrates ability to understand and report RPE using BORG scale, to self-measure pulse accurately, and to acknowledge the importance of the exercise prescription.  Exercise Prescription Goal: Starting with aerobic activity 30 plus minutes a day, 3 days per week for initial exercise prescription. Provide home exercise prescription and guidelines that participant acknowledges understanding prior to discharge.  Activity Barriers & Risk Stratification:     Activity Barriers & Cardiac Risk Stratification - 11/01/16 1434       Activity Barriers & Cardiac Risk Stratification   Activity Barriers Arthritis;Back Problems;Other (comment)   Comments stress fracture in R foot, arthritis in hands, B knee scope   Cardiac Risk Stratification High      6 Minute Walk:     6 Minute Walk    Row Name 11/01/16 1552         6 Minute Walk   Phase Initial     Distance 1704 feet     Walk Time 6 minutes     # of Rest Breaks 0     MPH 3.22     METS 4.05     RPE 12     VO2 Peak 14.2     Symptoms Yes (comment)     Comments pt c/o dizziness and lightheadedness at the end of walk test     Resting HR 66 bpm     Resting BP 104/60     Resting Oxygen Saturation  97 %     Exercise Oxygen Saturation  during 6 min walk 98 %     Max Ex. HR 102 bpm     Max Ex. BP 124/72     2 Minute Post BP 102/60        Oxygen Initial Assessment:   Oxygen Re-Evaluation:   Oxygen Discharge (Final Oxygen Re-Evaluation):   Initial Exercise Prescription:     Initial Exercise Prescription - 11/01/16 1500      Date of Initial Exercise RX and Referring Provider   Date 11/01/16   Referring Provider Dr Italy Hilty     Treadmill   MPH 3   Grade 0   Minutes 10   METs 3.3     Bike   Level 1   Minutes 10   METs 3.1     NuStep   Level 3   SPM 80   Minutes 10   METs 2.5     Prescription Details   Frequency (times per week) 3   Duration Progress to 30 minutes of continuous aerobic without signs/symptoms of physical distress     Intensity   THRR 40-80% of Max Heartrate 64-128   Ratings of Perceived Exertion 11-13   Perceived Dyspnea 0-4     Progression   Progression Continue to progress workloads to maintain intensity without signs/symptoms of physical distress.     Resistance Training   Training Prescription Yes   Weight 4lbs   Reps 10-15      Perform Capillary Blood Glucose checks as needed.  Exercise Prescription Changes:   Exercise Comments:   Exercise Goals and Review:     Exercise Goals    Row  Name 11/01/16 1434             Exercise Goals   Increase Physical Activity Yes  Intervention Provide advice, education, support and counseling about physical activity/exercise needs.;Develop an individualized exercise prescription for aerobic and resistive training based on initial evaluation findings, risk stratification, comorbidities and participant's personal goals.       Expected Outcomes Achievement of increased cardiorespiratory fitness and enhanced flexibility, muscular endurance and strength shown through measurements of functional capacity and personal statement of participant.       Increase Strength and Stamina Yes  improve endurance to continue to play golf       Intervention Provide advice, education, support and counseling about physical activity/exercise needs.;Develop an individualized exercise prescription for aerobic and resistive training based on initial evaluation findings, risk stratification, comorbidities and participant's personal goals.       Expected Outcomes Achievement of increased cardiorespiratory fitness and enhanced flexibility, muscular endurance and strength shown through measurements of functional capacity and personal statement of participant.       Able to understand and use rate of perceived exertion (RPE) scale Yes       Intervention Provide education and explanation on how to use RPE scale       Expected Outcomes Short Term: Able to use RPE daily in rehab to express subjective intensity level;Long Term:  Able to use RPE to guide intensity level when exercising independently       Knowledge and understanding of Target Heart Rate Range (THRR) Yes       Intervention Provide education and explanation of THRR including how the numbers were predicted and where they are located for reference       Expected Outcomes Short Term: Able to state/look up THRR;Long Term: Able to use THRR to govern intensity when exercising independently;Short Term: Able to use  daily as guideline for intensity in rehab       Able to check pulse independently Yes       Intervention Provide education and demonstration on how to check pulse in carotid and radial arteries.;Review the importance of being able to check your own pulse for safety during independent exercise       Expected Outcomes Short Term: Able to explain why pulse checking is important during independent exercise;Long Term: Able to check pulse independently and accurately       Understanding of Exercise Prescription Yes       Intervention Provide education, explanation, and written materials on patient's individual exercise prescription       Expected Outcomes Short Term: Able to explain program exercise prescription;Long Term: Able to explain home exercise prescription to exercise independently          Exercise Goals Re-Evaluation :    Discharge Exercise Prescription (Final Exercise Prescription Changes):   Nutrition:  Target Goals: Understanding of nutrition guidelines, daily intake of sodium 1500mg , cholesterol 200mg , calories 30% from fat and 7% or less from saturated fats, daily to have 5 or more servings of fruits and vegetables.  Biometrics:     Pre Biometrics - 11/01/16 1628      Pre Biometrics   Height 5' 9.75" (1.772 m)   Weight 201 lb 11.5 oz (91.5 kg)   Waist Circumference 40.5 inches   Hip Circumference 41 inches   Waist to Hip Ratio 0.99 %   BMI (Calculated) 29.14   Triceps Skinfold 20 mm   % Body Fat 29 %   Grip Strength 46 kg   Flexibility 0 in   Single Leg Stand 30 seconds       Nutrition Therapy Plan and Nutrition Goals:   Nutrition  Discharge: Nutrition Scores:   Nutrition Goals Re-Evaluation:   Nutrition Goals Re-Evaluation:   Nutrition Goals Discharge (Final Nutrition Goals Re-Evaluation):   Psychosocial: Target Goals: Acknowledge presence or absence of significant depression and/or stress, maximize coping skills, provide positive support system.  Participant is able to verbalize types and ability to use techniques and skills needed for reducing stress and depression.  Initial Review & Psychosocial Screening:     Initial Psych Review & Screening - 11/01/16 1624      Initial Review   Current issues with None Identified     Family Dynamics   Good Support System? Yes     Barriers   Psychosocial barriers to participate in program There are no identifiable barriers or psychosocial needs.     Screening Interventions   Interventions Encouraged to exercise      Quality of Life Scores:     Quality of Life - 11/01/16 1603      Quality of Life Scores   Health/Function Pre 21.57 %   Socioeconomic Pre 26.43 %   Psych/Spiritual Pre 25 %   Family Pre 28.8 %   GLOBAL Pre 24.34 %      PHQ-9: Recent Review Flowsheet Data    Depression screen [facility] 2/9 10/16/2016 09/03/2016   Decreased Interest 1 0   Down, Depressed, Hopeless 1 0   PHQ - 2 Score 2 0   Altered sleeping 0 -   Tired, decreased energy 2 -   Change in appetite 0 -   Feeling bad or failure about yourself  0 -   Trouble concentrating 2 -   Moving slowly or fidgety/restless 2 -   Suicidal thoughts 0 -   PHQ-9 Score 8 -     Interpretation of Total Score  Total Score Depression Severity:  1-4 = Minimal depression, 5-9 = Mild depression, 10-14 = Moderate depression, 15-19 = Moderately severe depression, 20-27 = Severe depression   Psychosocial Evaluation and Intervention:   Psychosocial Re-Evaluation:   Psychosocial Discharge (Final Psychosocial Re-Evaluation):   Vocational Rehabilitation: Provide vocational rehab assistance to qualifying candidates.   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 Participant will state understanding/return demonstration of topics presented.  Learning Barriers/Preferences:     Learning Barriers/Preferences - 11/01/16 1433      Learning Barriers/Preferences   Learning Barriers Sight   Learning Preferences Video;Pictoral;Computer/Internet      Education Topics: Count Your Pulse:  -Group instruction provided by verbal instruction, demonstration, patient participation and written materials to support subject.  Instructors address importance of being able to find your pulse and how to count your pulse when at home without a heart monitor.  Patients get hands on experience counting their pulse with staff help and individually.   Heart Attack, Angina, and Risk Factor Modification:  -Group instruction provided by verbal instruction, video, and written materials to support subject.  Instructors address signs and symptoms of angina and heart attacks.    Also discuss risk factors for heart disease and how to make changes to improve heart health risk factors.   Functional Fitness:  -Group instruction provided by verbal instruction, demonstration, patient participation, and written materials to support  subject.  Instructors address safety measures for doing things around the house.  Discuss how to get up and down off the floor, how to pick things up properly, how to safely get out of a chair without assistance, and balance training.   Meditation and Mindfulness:  -Group instruction provided by verbal instruction, patient participation, and written materials to support subject.  Instructor addresses importance of mindfulness and meditation practice to help reduce stress and improve awareness.  Instructor also leads participants through a meditation exercise.    Stretching for Flexibility and Mobility:  -Group instruction provided by verbal instruction, patient participation, and written materials to  support subject.  Instructors lead participants through series of stretches that are designed to increase flexibility thus improving mobility.  These stretches are additional exercise for major muscle groups that are typically performed during regular warm up and cool down.   Hands Only CPR:  -Group verbal, video, and participation provides a basic overview of AHA guidelines for community CPR. Role-play of emergencies allow participants the opportunity to practice calling for help and chest compression technique with discussion of AED use.   Hypertension: -Group verbal and written instruction that provides a basic overview of hypertension including the most recent diagnostic guidelines, risk factor reduction with self-care instructions and medication management.    Nutrition I class: Heart Healthy Eating:  -Group instruction provided by PowerPoint slides, verbal discussion, and written materials to support subject matter. The instructor gives an explanation and review of the Therapeutic Lifestyle Changes diet recommendations, which includes a discussion on lipid goals, dietary fat, sodium, fiber, plant stanol/sterol esters, sugar, and the components of a well-balanced, healthy diet.   Nutrition II class: Lifestyle Skills:  -Group instruction provided by PowerPoint slides, verbal discussion, and written materials to support subject matter. The instructor gives an explanation and review of label reading, grocery shopping for heart health, heart healthy recipe modifications, and ways to make healthier choices when eating out.   Diabetes Question & Answer:  -Group instruction provided by PowerPoint slides, verbal discussion, and written materials to support subject matter. The instructor gives an explanation and review of diabetes co-morbidities, pre- and post-prandial blood glucose goals, pre-exercise blood glucose goals, signs, symptoms, and treatment of hypoglycemia and hyperglycemia, and foot  care basics.   Diabetes Blitz:  -Group instruction provided by PowerPoint slides, verbal discussion, and written materials to support subject matter. The instructor gives an explanation and review of the physiology behind type 1 and type 2 diabetes, diabetes medications and rational behind using different medications, pre- and post-prandial blood glucose recommendations and Hemoglobin A1c goals, diabetes diet, and exercise including blood glucose guidelines for exercising safely.    Portion Distortion:  -Group instruction provided by PowerPoint slides, verbal discussion, written materials, and food models to support subject matter. The instructor gives an explanation of serving size versus portion size, changes in portions sizes over the last 20 years, and what consists of a serving from each food group.   Stress Management:  -Group instruction provided by verbal instruction, video, and written materials to support subject matter.  Instructors review role of stress in heart disease and how to cope with stress positively.     Exercising on Your Own:  -Group instruction provided by verbal instruction, power point, and written materials to support subject.  Instructors discuss benefits of exercise, components of exercise, frequency and intensity of exercise, and end points for exercise.  Also discuss use of nitroglycerin and activating EMS.  Review options of places to  exercise outside of rehab.  Review guidelines for sex with heart disease.   Cardiac Drugs I:  -Group instruction provided by verbal instruction and written materials to support subject.  Instructor reviews cardiac drug classes: antiplatelets, anticoagulants, beta blockers, and statins.  Instructor discusses reasons, side effects, and lifestyle considerations for each drug class.   Cardiac Drugs II:  -Group instruction provided by verbal instruction and written materials to support subject.  Instructor reviews cardiac drug classes:  angiotensin converting enzyme inhibitors (ACE-I), angiotensin II receptor blockers (ARBs), nitrates, and calcium channel blockers.  Instructor discusses reasons, side effects, and lifestyle considerations for each drug class.   Anatomy and Physiology of the Circulatory System:  Group verbal and written instruction and models provide basic cardiac anatomy and physiology, with the coronary electrical and arterial systems. Review of: AMI, Angina, Valve disease, Heart Failure, Peripheral Artery Disease, Cardiac Arrhythmia, Pacemakers, and the ICD.   Other Education:  -Group or individual verbal, written, or video instructions that support the educational goals of the cardiac rehab program.   Knowledge Questionnaire Score:     Knowledge Questionnaire Score - 11/01/16 1551      Knowledge Questionnaire Score   Pre Score 24/28      Core Components/Risk Factors/Patient Goals at Admission:     Personal Goals and Risk Factors at Admission - 11/01/16 1629      Core Components/Risk Factors/Patient Goals on Admission    Weight Management Yes;Weight Loss;Weight Maintenance   Intervention Weight Management: Provide education and appropriate resources to help participant work on and attain dietary goals.;Weight Management: Develop a combined nutrition and exercise program designed to reach desired caloric intake, while maintaining appropriate intake of nutrient and fiber, sodium and fats, and appropriate energy expenditure required for the weight goal.;Weight Management/Obesity: Establish reasonable short term and long term weight goals.   Expected Outcomes Short Term: Continue to assess and modify interventions until short term weight is achieved;Weight Loss: Understanding of general recommendations for a balanced deficit meal plan, which promotes 1-2 lb weight loss per week and includes a negative energy balance of [phone] kcal/d;Weight Maintenance: Understanding of the daily nutrition guidelines,  which includes 25-35% calories from fat, 7% or less cal from saturated fats, less than 200mg  cholesterol, less than 1.5gm of sodium, & 5 or more servings of fruits and vegetables daily;Long Term: Adherence to nutrition and physical activity/exercise program aimed toward attainment of established weight goal;Understanding recommendations for meals to include 15-35% energy as protein, 25-35% energy from fat, 35-60% energy from carbohydrates, less than 200mg  of dietary cholesterol, 20-35 gm of total fiber daily;Understanding of distribution of calorie intake throughout the day with the consumption of 4-5 meals/snacks   Tobacco Cessation Yes   Number of packs per day quit 10/11/16  smoked for 30 years at least 0.25 ppd   Intervention Assist the participant in steps to quit. Provide individualized education and counseling about committing to Tobacco Cessation, relapse prevention, and pharmacological support that can be provided by physician.;Education officer, environmental, assist with locating and accessing local/national Quit Smoking programs, and support quit date choice.   Expected Outcomes Short Term: Will demonstrate readiness to quit, by selecting a quit date.;Short Term: Will quit all tobacco product use, adhering to prevention of relapse plan.;Long Term: Complete abstinence from all tobacco products for at least 12 months from quit date.   Lipids Yes   Intervention Provide education and support for participant on nutrition & aerobic/resistive exercise along with prescribed medications to achieve LDL 70mg , HDL >40mg .  Expected Outcomes Short Term: Participant states understanding of desired cholesterol values and is compliant with medications prescribed. Participant is following exercise prescription and nutrition guidelines.;Long Term: Cholesterol controlled with medications as prescribed, with individualized exercise RX and with personalized nutrition plan. Value goals: LDL < , HDL > 40 mg.   Stress  Yes   Intervention Refer participants experiencing significant psychosocial distress to appropriate mental health specialists for further evaluation and treatment. When possible, include family members and significant others in education/counseling sessions.      Core Components/Risk Factors/Patient Goals Review:    Core Components/Risk Factors/Patient Goals at Discharge (Final Review):    ITP Comments:     ITP Comments    Row Name 11/01/16 1419           ITP Comments Dr. Armanda Magic, Medical Director          Comments:Chris attended orientation on 11/01/16 and is scheduled to begin exercise on 11/19/16.Gladstone Lighter, RN,BSN 11/06/2016 5:14 PM

## 2016-11-07 ENCOUNTER — Telehealth: Payer: Self-pay | Admitting: *Deleted

## 2016-11-07 NOTE — Progress Notes (Signed)
Joseph Jacobs 61 y.o. male DOB [birth year] MRN [id number]       Nutrition: Brief Note  1. Status post coronary artery stent placement 10/12/16 DES RCA    Past Medical History:  Diagnosis Date  . Anginal pain (HCC)   . Arthritis    "hands, knees, back, rightt foot" (10/11/2016)  . Asthma   . Chronic bronchitis (HCC)    "get it most years" (10/11/2016)  . Chronic lower back pain   . Coronary artery disease   . High cholesterol   . Lumbar degenerative disc disease 12/14/2013  . Migraine    "daily to weekly" (10/11/2016)  . Pneumonia    "several times; last time was in 03/2016" (10/11/2016)  . Sleep apnea   . Stress fracture of foot 12/14/2013   "right"   Meds reviewed.  HT: Ht Readings from Last 1 Encounters:  11/02/16 5' 9.75" (1.772 m)    WT: Wt Readings from Last 3 Encounters:  11/02/16 196 lb (88.9 kg)  11/01/16 201 lb 11.5 oz (91.5 kg)  10/25/16 194 lb 12.8 oz (88.4 kg)     BMI 28.3   Current tobacco use? No  Labs:  Lipid Panel     Component Value Date/Time   CHOL 185 09/26/2016 1149   TRIG 188 (H) 09/26/2016 1149   HDL 47 09/26/2016 1149   CHOLHDL 3.9 09/26/2016 1149   VLDL 36 (H) 07/06/2015 0939   LDLCALC 104 07/06/2015 0939    Lab Results  Component Value Date   HGBA1C 5.9 (H) 09/26/2016   CBG (last 3)  No results for input(s): GLUCAP in the last 72 hours.  Nutrition Diagnosis ? Food-and nutrition-related knowledge deficit related to lack of exposure to information as related to diagnosis of: ? CVD ? Pre-diabetes ? Overweight related to excessive energy intake as evidenced by a BMI of 28.3  Nutrition Goal(s):  ? Pt to identify and limit food sources of saturated fat, trans fat, and sodium ? Pt to identify food quantities necessary to achieve weight loss of 6-24 lb (2.7-10.9 kg) at graduation from cardiac rehab.   Plan:  Pt to attend nutrition classes ? Nutrition I ? Nutrition II ? Portion Distortion  Will provide client-centered nutrition  education as part of interdisciplinary care.   Monitor and evaluate progress toward nutrition goal with team.  Mickle Plumb, M.Ed, RD, LDN, CDE 11/07/2016 10:33 AM

## 2016-11-07 NOTE — Telephone Encounter (Signed)
Received call from [facility] from Cardiac Rehab yesterday. Patient has been having some issues with feeling like he can not catch his breath. She did send over strips for review. Did speak with patient and he had this breathing issue day after stents placed but went away after few days. This is at rest and not brought on or worse with exertion. He played 9 holes of golf over weekend and stated does not impact daily activities. Patient denies any chest pain. Recommended patient taking Brilinta with black coffee to see if symptoms improve. Patient would prefer to stay on Brilinta if possible. PCP did order 48 hour monitor for palpitations which has not been scheduled. Discussed with Lanora Manis PA who reviewed strips, no change in plan and ok to participate with Cardiac Rehab. Advised patient and to call back if no better or worse. Message sent to [facility] to call and scheduled monitor.

## 2016-11-09 ENCOUNTER — Telehealth (HOSPITAL_COMMUNITY): Payer: Self-pay | Admitting: *Deleted

## 2016-11-09 NOTE — Telephone Encounter (Signed)
-----   Message from Burnell Blanks sent at 11/08/2016  9:50 AM EDT ----- Duard Brady,  So I spoke with Mr Punch and he is going to try to continue on the Brilinta but advised to call office if no improvement. Lanora Manis PA reviewed strips and he is ok to proceed with Cardiac Rehab. I sent a message to scheduling to get monitor scheduled. If you need anything let us know.  Have a great day!! Juliette Alcide

## 2016-11-12 ENCOUNTER — Ambulatory Visit (INDEPENDENT_AMBULATORY_CARE_PROVIDER_SITE_OTHER): Payer: BC Managed Care – PPO

## 2016-11-12 DIAGNOSIS — R002 Palpitations: Secondary | ICD-10-CM

## 2016-11-19 ENCOUNTER — Encounter (HOSPITAL_COMMUNITY)
Admission: RE | Admit: 2016-11-19 | Discharge: 2016-11-19 | Disposition: A | Discharge status: Home / Self Care | Payer: BC Managed Care – PPO | Source: Ambulatory Visit | Attending: Internal Medicine | Admitting: Internal Medicine

## 2016-11-19 DIAGNOSIS — Z955 Presence of coronary angioplasty implant and graft: Secondary | ICD-10-CM | POA: Diagnosis not present

## 2016-11-19 NOTE — Progress Notes (Signed)
Daily Session Note  Patient Details  Name: Joseph Jacobs MRN: [id number] Date of Birth: [birth year] Referring Provider:     [place] from 11/01/2016 in [place]  Referring Provider  Dr Mali Hilty      Encounter Date: 11/19/2016  Check In:     Session Check In - 11/19/16 1455      Check-In   Location [facility]   Staff Present Seward Carol, MS, ACSM CEP, Exercise Physiologist;Maria Venetia Maxon, RN, BSN   Supervising physician immediately available to respond to emergencies [facility] immediately available   Physician(s) Dr. Horris Latino    Medication changes reported     No   Fall or balance concerns reported    No   Tobacco Cessation No Change   Warm-up and Cool-down Performed as group-led instruction   Resistance Training Performed Yes   VAD Patient? No     Pain Assessment   Currently in Pain? No/denies      Capillary Blood Glucose: No results found for this or any previous visit (from the past 24 hour(s)).    History  Smoking Status  . Former Smoker  . Packs/day: 0.33  . Years: 44.00  . Types: Cigarettes  Smokeless Tobacco  . Never Used    Goals Met:  Exercise tolerated well  Goals Unmet:  Not Applicable  Comments: Joseph Jacobs started cardiac rehab today.  Pt tolerated light exercise without difficulty. VSS, telemetry-Sinus Rhythm, asymptomatic.  Medication list reconciled. Pt denies barriers to medicaiton compliance.  PSYCHOSOCIAL ASSESSMENT:  PHQ-0. Pt exhibits positive coping skills, hopeful outlook with supportive family. No psychosocial needs identified at this time, no psychosocial interventions necessary.    Pt enjoys golfing, reading and rebuilding amplifiers.   Pt oriented to exercise equipment and routine.    Understanding verbalized.  Joseph Jacobs says he is not smoking currently. Joseph Jacobs says he is using nicorette gum and Vapes twice a day. Will continue to encourage smoking  cessation. Barnet Pall, RN,BSN 11/22/2016 10:35 AM   Dr. Fransico Him is Medical Director for Cardiac Rehab at [facility].

## 2016-11-21 ENCOUNTER — Encounter (HOSPITAL_COMMUNITY)
Admission: RE | Admit: 2016-11-21 | Discharge: 2016-11-21 | Disposition: A | Discharge status: Home / Self Care | Payer: BC Managed Care – PPO | Source: Ambulatory Visit | Attending: Internal Medicine | Admitting: Internal Medicine

## 2016-11-21 DIAGNOSIS — Z955 Presence of coronary angioplasty implant and graft: Secondary | ICD-10-CM | POA: Diagnosis not present

## 2016-11-23 ENCOUNTER — Encounter (HOSPITAL_COMMUNITY)
Admission: RE | Admit: 2016-11-23 | Discharge: 2016-11-23 | Disposition: A | Discharge status: Home / Self Care | Payer: BC Managed Care – PPO | Source: Ambulatory Visit | Attending: Internal Medicine | Admitting: Internal Medicine

## 2016-11-23 DIAGNOSIS — Z955 Presence of coronary angioplasty implant and graft: Secondary | ICD-10-CM | POA: Diagnosis not present

## 2016-11-26 ENCOUNTER — Encounter: Payer: Self-pay | Admitting: Sports Medicine

## 2016-11-26 ENCOUNTER — Encounter (HOSPITAL_COMMUNITY)
Admission: RE | Admit: 2016-11-26 | Discharge: 2016-11-26 | Disposition: A | Discharge status: Home / Self Care | Payer: BC Managed Care – PPO | Source: Ambulatory Visit | Attending: Internal Medicine | Admitting: Internal Medicine

## 2016-11-26 DIAGNOSIS — Z955 Presence of coronary angioplasty implant and graft: Secondary | ICD-10-CM | POA: Diagnosis not present

## 2016-11-26 NOTE — Progress Notes (Signed)
Reviewed home exercise guidelines with patient including endpoints, temperature precautions, target heart rate and rate of perceived exertion. Pt plans to walk as his mode of home exercise. Walking is limited by stress fracture in right foot. Pt is wearing a boot, which helps with walking and plans to try walking 10 minutes 3x's/day or 15 twice/day, 1-2 days/week. Pt is also looking into buying a recumbent bike for home exercise. Pt voices understanding of instructions given. Artist Paislinty M Chariti Havel, MS, ACSM CEP

## 2016-11-27 MED ORDER — ATORVASTATIN CALCIUM 80 MG PO TABS: 80.0000 mg | ORAL_TABLET | Freq: Every day | ORAL | 3 refills | Status: DC

## 2016-11-28 ENCOUNTER — Encounter (HOSPITAL_COMMUNITY): Payer: BC Managed Care – PPO

## 2016-11-29 NOTE — Progress Notes (Signed)
Cardiac Individual Treatment Plan  Patient Details  Name: Joseph Jacobs MRN: [id number] Date of Birth: 02/10/1955 Referring Provider:     CARDIAC REHAB PHASE II ORIENTATION from 11/01/2016 in 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  acetaminophen (TYLENOL) 325 MG tablet, Take 650 mg by mouth every 6 (six) hours as needed., Disp: , Rfl:  .  albuterol (PROVENTIL HFA;VENTOLIN HFA) 108 (90 Base) MCG/ACT inhaler, Inhale 2 puffs into the lungs every 6 (six) hours as needed for wheezing or shortness of breath., Disp: 1 Inhaler, Rfl: 3 .  albuterol (PROVENTIL) (2.5 MG/3ML) 0.083% nebulizer solution, Take 3 mLs (2.5 mg total) by nebulization every 6 (six) hours as needed for wheezing or shortness of breath., Disp: 75 mL, Rfl: 12 .  aspirin EC 81 MG tablet, Take 1 tablet (81 mg total) by mouth daily. (Patient taking differently: Take 81 mg by mouth every evening. ), Disp: , Rfl:  .  atorvastatin (LIPITOR) 80 MG tablet, Take 1 tablet (80 mg total) by mouth at bedtime., Disp: 90 tablet, Rfl: 3 .  fluticasone (FLONASE) 50 MCG/ACT nasal spray, One spray in each nostril twice a day, use left hand for right nostril, and right hand for left nostril., Disp: 48 g, Rfl: 3 .  metoprolol succinate (TOPROL XL) 25 MG 24 hr tablet, Take 1 tablet (25 mg total) by mouth daily., Disp: 90 tablet, Rfl: 3 .  montelukast (SINGULAIR) 10 MG tablet, Take 1 tablet (10 mg total) by mouth at bedtime., Disp: 90 tablet, Rfl: 3 .  nitroGLYCERIN (NITROSTAT) 0.4 MG SL tablet, Place 1 tablet (0.4 mg total) under the tongue every 5 (five) minutes as needed  for chest pain (or tightness)., Disp: 30 tablet, Rfl: 0 .  ticagrelor (BRILINTA) 90 MG TABS tablet, Take 1 tablet (90 mg total) by mouth 2 (two) times daily., Disp: 60 tablet, Rfl: 11  Past Medical History: Past Medical History:  Diagnosis Date  . Anginal pain (HCC)   . Arthritis    "hands, knees, back, rightt foot" (10/11/2016)  . Asthma   . Chronic bronchitis (HCC)    "get it most years" (10/11/2016)  . Chronic lower back pain   . Coronary artery disease   . High cholesterol   . Lumbar degenerative disc disease 12/14/2013  . Migraine    "daily to weekly" (10/11/2016)  . Pneumonia    "several times; last time was in 03/2016" (10/11/2016)  . Sleep apnea   . Stress fracture of foot 12/14/2013   "right"    Tobacco Use: History  Smoking Status  . Former Smoker  . Packs/day: 0.33  . Years: 44.00  . Types: Cigarettes  Smokeless Tobacco  . Never Used    Labs: Recent Review Flowsheet Data    Labs for ITP Cardiac and Pulmonary Rehab Latest Ref Rng & Units 07/06/2015 09/26/2016   Cholestrol <200 mg/dL 409 811   LDLCALC <914 mg/dL 782 -   HDL >95 mg/dL 60 47   Trlycerides <621 mg/dL 308(M) 578(I)   Hemoglobin A1c <5.7 % - 5.9(H)      Capillary Blood Glucose: Lab  Results  Component Value Date   GLUCAP 85 10/11/2016     Exercise Target Goals:    Exercise Program Goal: Individual exercise prescription set with THRR, safety & activity barriers. Participant demonstrates ability to understand and report RPE using BORG scale, to self-measure pulse accurately, and to acknowledge the importance of the exercise prescription.  Exercise Prescription Goal: Starting with aerobic activity 30 plus minutes a day, 3 days per week for initial exercise prescription. Provide home exercise prescription and guidelines that participant acknowledges understanding prior to discharge.  Activity Barriers & Risk Stratification:     Activity Barriers & Cardiac Risk Stratification - 11/01/16 1434       Activity Barriers & Cardiac Risk Stratification   Activity Barriers Arthritis;Back Problems;Other (comment)   Comments stress fracture in R foot, arthritis in hands, B knee scope   Cardiac Risk Stratification High      6 Minute Walk:     6 Minute Walk    Row Name 11/01/16 1552         6 Minute Walk   Phase Initial     Distance 1704 feet     Walk Time 6 minutes     # of Rest Breaks 0     MPH 3.22     METS 4.05     RPE 12     VO2 Peak 14.2     Symptoms Yes (comment)     Comments pt c/o dizziness and lightheadedness at the end of walk test     Resting HR 66 bpm     Resting BP 104/60     Resting Oxygen Saturation  97 %     Exercise Oxygen Saturation  during 6 min walk 98 %     Max Ex. HR 102 bpm     Max Ex. BP 124/72     2 Minute Post BP 102/60        Oxygen Initial Assessment:   Oxygen Re-Evaluation:   Oxygen Discharge (Final Oxygen Re-Evaluation):   Initial Exercise Prescription:     Initial Exercise Prescription - 11/01/16 1500      Date of Initial Exercise RX and Referring Provider   Date 11/01/16   Referring Provider Dr Italy Hilty     Treadmill   MPH 3   Grade 0   Minutes 10   METs 3.3     Bike   Level 1   Minutes 10   METs 3.1     NuStep   Level 3   SPM 80   Minutes 10   METs 2.5     Prescription Details   Frequency (times per week) 3   Duration Progress to 30 minutes of continuous aerobic without signs/symptoms of physical distress     Intensity   THRR 40-80% of Max Heartrate 64-128   Ratings of Perceived Exertion 11-13   Perceived Dyspnea 0-4     Progression   Progression Continue to progress workloads to maintain intensity without signs/symptoms of physical distress.     Resistance Training   Training Prescription Yes   Weight 4lbs   Reps 10-15      Perform Capillary Blood Glucose checks as needed.  Exercise Prescription Changes:      Exercise Prescription Changes    Row Name 11/23/16 1600 11/26/16 1600            Response to Exercise   Blood Pressure (Admit) 118/78 110/70      Blood Pressure (Exercise) 130/82 132/70  Blood Pressure (Exit) 118/78 100/60      Heart Rate (Admit) 78 bpm 86 bpm      Heart Rate (Exercise) 103 bpm 102 bpm      Heart Rate (Exit) 71 bpm 85 bpm      Rating of Perceived Exertion (Exercise) 13 13      Symptoms none none      Duration Continue with 30 min of aerobic exercise without signs/symptoms of physical distress. Continue with 30 min of aerobic exercise without signs/symptoms of physical distress.      Intensity THRR unchanged THRR unchanged        Progression   Progression Continue to progress workloads to maintain intensity without signs/symptoms of physical distress. Continue to progress workloads to maintain intensity without signs/symptoms of physical distress.      Average METs 3.1 2.8        Resistance Training   Training Prescription Yes Yes      Weight 5lbs 4lbs      Reps 10-15 10-15      Time 10 Minutes 10 Minutes        Interval Training   Interval Training No No        Treadmill   MPH -  -      Grade -  -      Minutes -  -      METs -  -        Bike   Level 1.1 1.2      Minutes 10 10      METs 3.31 3.5        NuStep   Level 4 4      SPM 80 80      Minutes 10 10      METs 3.4 2.7        Arm Ergometer   Level 2 2      Minutes 10 10      METs 2.46 2.2        Home Exercise Plan   Plans to continue exercise at  - Home (comment)      Frequency  - Add 2 additional days to program exercise sessions.      Initial Home Exercises Provided  - 11/26/16         Exercise Comments:      Exercise Comments    Row Name 11/23/16 1635 11/26/16 1500         Exercise Comments Off to a good start with exercise. Reviewed home exercise guidelines with patient.         Exercise Goals and Review:      Exercise Goals    Row Name 11/01/16 1434             Exercise Goals   Increase Physical Activity Yes       Intervention  Provide advice, education, support and counseling about physical activity/exercise needs.;Develop an individualized exercise prescription for aerobic and resistive training based on initial evaluation findings, risk stratification, comorbidities and participant's personal goals.       Expected Outcomes Achievement of increased cardiorespiratory fitness and enhanced flexibility, muscular endurance and strength shown through measurements of functional capacity and personal statement of participant.       Increase Strength and Stamina Yes  improve endurance to continue to play golf       Intervention Provide advice, education, support and counseling about physical activity/exercise needs.;Develop an individualized exercise prescription for aerobic and resistive training based on initial  evaluation findings, risk stratification, comorbidities and participant's personal goals.       Expected Outcomes Achievement of increased cardiorespiratory fitness and enhanced flexibility, muscular endurance and strength shown through measurements of functional capacity and personal statement of participant.       Able to understand and use rate of perceived exertion (RPE) scale Yes       Intervention Provide education and explanation on how to use RPE scale       Expected Outcomes Short Term: Able to use RPE daily in rehab to express subjective intensity level;Long Term:  Able to use RPE to guide intensity level when exercising independently       Knowledge and understanding of Target Heart Rate Range (THRR) Yes       Intervention Provide education and explanation of THRR including how the numbers were predicted and where they are located for reference       Expected Outcomes Short Term: Able to state/look up THRR;Long Term: Able to use THRR to govern intensity when exercising independently;Short Term: Able to use daily as guideline for intensity in rehab       Able to check pulse independently Yes       Intervention  Provide education and demonstration on how to check pulse in carotid and radial arteries.;Review the importance of being able to check your own pulse for safety during independent exercise       Expected Outcomes Short Term: Able to explain why pulse checking is important during independent exercise;Long Term: Able to check pulse independently and accurately       Understanding of Exercise Prescription Yes       Intervention Provide education, explanation, and written materials on patient's individual exercise prescription       Expected Outcomes Short Term: Able to explain program exercise prescription;Long Term: Able to explain home exercise prescription to exercise independently          Exercise Goals Re-Evaluation :     Exercise Goals Re-Evaluation    Row Name 11/23/16 1634 11/26/16 1500           Exercise Goal Re-Evaluation   Exercise Goals Review Able to understand and use rate of perceived exertion (RPE) scale Understanding of Exercise Prescription;Knowledge and understanding of Target Heart Rate Range (THRR)      Comments Patient is able to understand and use RPE scale appropriately. Reviewed home exercise guidelines with patient including THRR, RPE scale, and endpoints for exercise. Patient is walking about 10 minutes and is limited by stress fracture in right foot. Pt is wearing boot and is looking into getting a recumbent bike for home use.      Expected Outcomes Increase workloads as tolerated based perceived exertion and THRR. Walk as tolerated, 1-2 days/week.          Discharge Exercise Prescription (Final Exercise Prescription Changes):     Exercise Prescription Changes - 11/26/16 1600      Response to Exercise   Blood Pressure (Admit) 110/70   Blood Pressure (Exercise) 132/70   Blood Pressure (Exit) 100/60   Heart Rate (Admit) 86 bpm   Heart Rate (Exercise) 102 bpm   Heart Rate (Exit) 85 bpm   Rating of Perceived Exertion (Exercise) 13   Symptoms none    Duration Continue with 30 min of aerobic exercise without signs/symptoms of physical distress.   Intensity THRR unchanged     Progression   Progression Continue to progress workloads to maintain intensity without signs/symptoms of physical distress.  Average METs 2.8     Resistance Training   Training Prescription Yes   Weight 4lbs   Reps 10-15   Time 10 Minutes     Interval Training   Interval Training No     Bike   Level 1.2   Minutes 10   METs 3.5     NuStep   Level 4   SPM 80   Minutes 10   METs 2.7     Arm Ergometer   Level 2   Minutes 10   METs 2.2     Home Exercise Plan   Plans to continue exercise at Home (comment)   Frequency Add 2 additional days to program exercise sessions.   Initial Home Exercises Provided 11/26/16      Nutrition:  Target Goals: Understanding of nutrition guidelines, daily intake of sodium 1500mg , cholesterol 200mg , calories 30% from fat and 7% or less from saturated fats, daily to have 5 or more servings of fruits and vegetables.  Biometrics:     Pre Biometrics - 11/01/16 1628      Pre Biometrics   Height 5' 9.75" (1.772 m)   Weight 201 lb 11.5 oz (91.5 kg)   Waist Circumference 40.5 inches   Hip Circumference 41 inches   Waist to Hip Ratio 0.99 %   BMI (Calculated) 29.14   Triceps Skinfold 20 mm   % Body Fat 29 %   Grip Strength 46 kg   Flexibility 0 in   Single Leg Stand 30 seconds       Nutrition Therapy Plan and Nutrition Goals:     Nutrition Therapy & Goals - 11/07/16 1036      Nutrition Therapy   Diet Therapeutic Lifestyle Changes     Personal Nutrition Goals   Nutrition Goal Pt to identify and limit food sources of saturated fat, trans fat, and sodium   Personal Goal #2 Pt to identify food quantities necessary to achieve weight loss of 6-24 lb (2.7-10.9 kg) at graduation from cardiac rehab.      Intervention Plan   Intervention Prescribe, educate and counsel regarding individualized specific dietary  modifications aiming towards targeted core components such as weight, hypertension, lipid management, diabetes, heart failure and other comorbidities.   Expected Outcomes Short Term Goal: Understand basic principles of dietary content, such as calories, fat, sodium, cholesterol and nutrients.;Long Term Goal: Adherence to prescribed nutrition plan.      Nutrition Discharge: Nutrition Scores:   Nutrition Goals Re-Evaluation:   Nutrition Goals Re-Evaluation:   Nutrition Goals Discharge (Final Nutrition Goals Re-Evaluation):   Psychosocial: Target Goals: Acknowledge presence or absence of significant depression and/or stress, maximize coping skills, provide positive support system. Participant is able to verbalize types and ability to use techniques and skills needed for reducing stress and depression.  Initial Review & Psychosocial Screening:     Initial Psych Review & Screening - 11/01/16 1624      Initial Review   Current issues with None Identified     Family Dynamics   Good Support System? Yes     Barriers   Psychosocial barriers to participate in program There are no identifiable barriers or psychosocial needs.     Screening Interventions   Interventions Encouraged to exercise      Quality of Life Scores:     Quality of Life - 11/01/16 1603      Quality of Life Scores   Health/Function Pre 21.57 %   Socioeconomic Pre 26.43 %   Psych/Spiritual Pre  25 %   Family Pre 28.8 %   GLOBAL Pre 24.34 %      PHQ-9: Recent Review Flowsheet Data    Depression screen [facility] 2/9 10/16/2016 09/03/2016   Decreased Interest 1 0   Down, Depressed, Hopeless 1 0   PHQ - 2 Score 2 0   Altered sleeping 0 -   Tired, decreased energy 2 -   Change in appetite 0 -   Feeling bad or failure about yourself  0 -   Trouble concentrating 2 -   Moving slowly or fidgety/restless 2 -   Suicidal thoughts 0 -   PHQ-9 Score 8 -     Interpretation of Total Score  Total Score Depression  Severity:  1-4 = Minimal depression, 5-9 = Mild depression, 10-14 = Moderate depression, 15-19 = Moderately severe depression, 20-27 = Severe depression   Psychosocial Evaluation and Intervention:   Psychosocial Re-Evaluation:     Psychosocial Re-Evaluation    Row Name 11/29/16 1536 11/29/16 1537           Psychosocial Re-Evaluation   Current issues with None Identified None Identified      Interventions Encouraged to attend Cardiac Rehabilitation for the exercise Encouraged to attend Cardiac Rehabilitation for the exercise      Continue Psychosocial Services   - No Follow up required         Psychosocial Discharge (Final Psychosocial Re-Evaluation):     Psychosocial Re-Evaluation - 11/29/16 1537      Psychosocial Re-Evaluation   Current issues with None Identified   Interventions Encouraged to attend Cardiac Rehabilitation for the exercise   Continue Psychosocial Services  No Follow up required      Vocational Rehabilitation: Provide vocational rehab assistance to qualifying candidates.   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 Participant will state understanding/return demonstration of topics presented.  Learning Barriers/Preferences:     Learning Barriers/Preferences - 11/01/16 1433      Learning Barriers/Preferences   Learning Barriers Sight   Learning Preferences Video;Pictoral;Computer/Internet      Education Topics: Count Your Pulse:  -Group instruction provided by verbal instruction, demonstration, patient participation and written materials to support subject.  Instructors address importance of being able to find your pulse and how  to count your pulse when at home without a heart monitor.  Patients get hands on experience counting their pulse with staff help and individually.   CARDIAC REHAB PHASE II EXERCISE from 11/30/2016 in [facility] CARDIAC REHAB  Date  11/30/16  Instruction Review Code  2- meets goals/outcomes      Heart Attack, Angina, and Risk Factor Modification:  -Group instruction provided by verbal instruction, video, and written materials to support subject.  Instructors address signs and symptoms of angina and heart attacks.    Also discuss risk factors for heart disease and how to make changes to improve heart health risk factors.   Functional Fitness:  -Group instruction provided by verbal instruction, demonstration, patient participation, and written materials to support subject.  Instructors address safety measures for doing things around the house.  Discuss how to get up and down off the floor, how to pick  things up properly, how to safely get out of a chair without assistance, and balance training.   Meditation and Mindfulness:  -Group instruction provided by verbal instruction, patient participation, and written materials to support subject.  Instructor addresses importance of mindfulness and meditation practice to help reduce stress and improve awareness.  Instructor also leads participants through a meditation exercise.    Stretching for Flexibility and Mobility:  -Group instruction provided by verbal instruction, patient participation, and written materials to support subject.  Instructors lead participants through series of stretches that are designed to increase flexibility thus improving mobility.  These stretches are additional exercise for major muscle groups that are typically performed during regular warm up and cool down.   CARDIAC REHAB PHASE II EXERCISE from 11/30/2016 in [facility] CARDIAC REHAB  Date  11/21/16  Educator  EP  Instruction Review Code   2- meets goals/outcomes      Hands Only CPR:  -Group verbal, video, and participation provides a basic overview of AHA guidelines for community CPR. Role-play of emergencies allow participants the opportunity to practice calling for help and chest compression technique with discussion of AED use.   Hypertension: -Group verbal and written instruction that provides a basic overview of hypertension including the most recent diagnostic guidelines, risk factor reduction with self-care instructions and medication management.    Nutrition I class: Heart Healthy Eating:  -Group instruction provided by PowerPoint slides, verbal discussion, and written materials to support subject matter. The instructor gives an explanation and review of the Therapeutic Lifestyle Changes diet recommendations, which includes a discussion on lipid goals, dietary fat, sodium, fiber, plant stanol/sterol esters, sugar, and the components of a well-balanced, healthy diet.   Nutrition II class: Lifestyle Skills:  -Group instruction provided by PowerPoint slides, verbal discussion, and written materials to support subject matter. The instructor gives an explanation and review of label reading, grocery shopping for heart health, heart healthy recipe modifications, and ways to make healthier choices when eating out.   Diabetes Question & Answer:  -Group instruction provided by PowerPoint slides, verbal discussion, and written materials to support subject matter. The instructor gives an explanation and review of diabetes co-morbidities, pre- and post-prandial blood glucose goals, pre-exercise blood glucose goals, signs, symptoms, and treatment of hypoglycemia and hyperglycemia, and foot care basics.   Diabetes Blitz:  -Group instruction provided by PowerPoint slides, verbal discussion, and written materials to support subject matter. The instructor gives an explanation and review of the physiology behind type 1 and type 2  diabetes, diabetes medications and rational behind using different medications, pre- and post-prandial blood glucose recommendations and Hemoglobin A1c goals, diabetes diet, and exercise including blood glucose guidelines for exercising safely.    Portion Distortion:  -Group instruction provided by PowerPoint slides, verbal discussion, written materials, and food models to support subject matter. The instructor gives an explanation of serving size versus portion size, changes in portions sizes over the last 20 years, and what consists of a serving from each food group.   Stress Management:  -Group instruction provided by verbal instruction, video, and written materials to support subject matter.  Instructors review role of stress in heart disease and how to cope with stress positively.     CARDIAC REHAB PHASE II EXERCISE from 11/30/2016 in [facility] CARDIAC REHAB  Date  11/23/16  Instruction Review Code  2- meets goals/outcomes      Exercising on Your Own:  -Group instruction provided by verbal instruction, power point,  and written materials to support subject.  Instructors discuss benefits of exercise, components of exercise, frequency and intensity of exercise, and end points for exercise.  Also discuss use of nitroglycerin and activating EMS.  Review options of places to exercise outside of rehab.  Review guidelines for sex with heart disease.   Cardiac Drugs I:  -Group instruction provided by verbal instruction and written materials to support subject.  Instructor reviews cardiac drug classes: antiplatelets, anticoagulants, beta blockers, and statins.  Instructor discusses reasons, side effects, and lifestyle considerations for each drug class.   Cardiac Drugs II:  -Group instruction provided by verbal instruction and written materials to support subject.  Instructor reviews cardiac drug classes: angiotensin converting enzyme inhibitors (ACE-I), angiotensin II receptor  blockers (ARBs), nitrates, and calcium channel blockers.  Instructor discusses reasons, side effects, and lifestyle considerations for each drug class.   Anatomy and Physiology of the Circulatory System:  Group verbal and written instruction and models provide basic cardiac anatomy and physiology, with the coronary electrical and arterial systems. Review of: AMI, Angina, Valve disease, Heart Failure, Peripheral Artery Disease, Cardiac Arrhythmia, Pacemakers, and the ICD.   Other Education:  -Group or individual verbal, written, or video instructions that support the educational goals of the cardiac rehab program.   Knowledge Questionnaire Score:     Knowledge Questionnaire Score - 11/01/16 1551      Knowledge Questionnaire Score   Pre Score 24/28      Core Components/Risk Factors/Patient Goals at Admission:     Personal Goals and Risk Factors at Admission - 11/01/16 1629      Core Components/Risk Factors/Patient Goals on Admission    Weight Management Yes;Weight Loss;Weight Maintenance   Intervention Weight Management: Provide education and appropriate resources to help participant work on and attain dietary goals.;Weight Management: Develop a combined nutrition and exercise program designed to reach desired caloric intake, while maintaining appropriate intake of nutrient and fiber, sodium and fats, and appropriate energy expenditure required for the weight goal.;Weight Management/Obesity: Establish reasonable short term and long term weight goals.   Expected Outcomes Short Term: Continue to assess and modify interventions until short term weight is achieved;Weight Loss: Understanding of general recommendations for a balanced deficit meal plan, which promotes 1-2 lb weight loss per week and includes a negative energy balance of [phone] kcal/d;Weight Maintenance: Understanding of the daily nutrition guidelines, which includes 25-35% calories from fat, 7% or less cal from saturated fats,  less than 200mg  cholesterol, less than 1.5gm of sodium, & 5 or more servings of fruits and vegetables daily;Long Term: Adherence to nutrition and physical activity/exercise program aimed toward attainment of established weight goal;Understanding recommendations for meals to include 15-35% energy as protein, 25-35% energy from fat, 35-60% energy from carbohydrates, less than 200mg  of dietary cholesterol, 20-35 gm of total fiber daily;Understanding of distribution of calorie intake throughout the day with the consumption of 4-5 meals/snacks   Tobacco Cessation Yes   Number of packs per day quit 10/11/16  smoked for 30 years at least 0.25 ppd   Intervention Assist the participant in steps to quit. Provide individualized education and counseling about committing to Tobacco Cessation, relapse prevention, and pharmacological support that can be provided by physician.;Education officer, environmental, assist with locating and accessing local/national Quit Smoking programs, and support quit date choice.   Expected Outcomes Short Term: Will demonstrate readiness to quit, by selecting a quit date.;Short Term: Will quit all tobacco product use, adhering to prevention of relapse plan.;Long Term: Complete abstinence from  all tobacco products for at least 12 months from quit date.   Lipids Yes   Intervention Provide education and support for participant on nutrition & aerobic/resistive exercise along with prescribed medications to achieve LDL 70mg , HDL >40mg .   Expected Outcomes Short Term: Participant states understanding of desired cholesterol values and is compliant with medications prescribed. Participant is following exercise prescription and nutrition guidelines.;Long Term: Cholesterol controlled with medications as prescribed, with individualized exercise RX and with personalized nutrition plan. Value goals: LDL < 70mg , HDL > 40 mg.   Stress Yes   Intervention Refer participants experiencing significant psychosocial  distress to appropriate mental health specialists for further evaluation and treatment. When possible, include family members and significant others in education/counseling sessions.      Core Components/Risk Factors/Patient Goals Review:      Goals and Risk Factor Review    Row Name 11/29/16 1524             Core Components/Risk Factors/Patient Goals Review   Personal Goals Review Weight Management/Obesity;Lipids;Tobacco Cessation       Review Joseph Jacobs is doing well with exercise. Vital signs have been stable.       Expected Outcomes Joseph Jacobs will continue to come to cardiac rehab for exericse and take his medicaitons as presribed. Will continue to encourage smoking cessation.          Core Components/Risk Factors/Patient Goals at Discharge (Final Review):      Goals and Risk Factor Review - 11/29/16 1524      Core Components/Risk Factors/Patient Goals Review   Personal Goals Review Weight Management/Obesity;Lipids;Tobacco Cessation   Review Joseph Jacobs is doing well with exercise. Vital signs have been stable.   Expected Outcomes Joseph Jacobs will continue to come to cardiac rehab for exericse and take his medicaitons as presribed. Will continue to encourage smoking cessation.      ITP Comments:     ITP Comments    Row Name 11/01/16 1419           ITP Comments Dr. Armanda Magicraci Turner, Medical Director          Comments: Joseph Jacobs is making expected progress toward personal goals after completing 5 sessions. Recommend continued exercise and life style modification education including  stress management and relaxation techniques to decrease cardiac risk profile. Joseph Jacobs is off to a good start to exercise. Joseph Jacobs has been wearing his boot to protect his foot with exercise. Joseph Jacobs is not smoking currently but is using Nicorette gum and says he vapes twice a day. Will continue to encourage smoking cessation.Gladstone LighterMaria Denzell Colasanti, RN,BSN 11/30/2016 5:13 PM

## 2016-11-30 ENCOUNTER — Encounter (HOSPITAL_COMMUNITY)
Admission: RE | Admit: 2016-11-30 | Discharge: 2016-11-30 | Disposition: A | Discharge status: Home / Self Care | Payer: BC Managed Care – PPO | Source: Ambulatory Visit | Attending: Internal Medicine | Admitting: Internal Medicine

## 2016-11-30 DIAGNOSIS — Z48812 Encounter for surgical aftercare following surgery on the circulatory system: Secondary | ICD-10-CM | POA: Diagnosis not present

## 2016-11-30 DIAGNOSIS — Z955 Presence of coronary angioplasty implant and graft: Secondary | ICD-10-CM | POA: Insufficient documentation

## 2016-12-03 ENCOUNTER — Encounter (HOSPITAL_COMMUNITY)
Admission: RE | Admit: 2016-12-03 | Discharge: 2016-12-03 | Disposition: A | Discharge status: Home / Self Care | Payer: BC Managed Care – PPO | Source: Ambulatory Visit | Attending: Internal Medicine | Admitting: Internal Medicine

## 2016-12-03 DIAGNOSIS — Z955 Presence of coronary angioplasty implant and graft: Secondary | ICD-10-CM

## 2016-12-05 ENCOUNTER — Encounter (HOSPITAL_COMMUNITY): Payer: BC Managed Care – PPO

## 2016-12-07 ENCOUNTER — Encounter (HOSPITAL_COMMUNITY)
Admission: RE | Admit: 2016-12-07 | Discharge: 2016-12-07 | Disposition: A | Discharge status: Home / Self Care | Payer: BC Managed Care – PPO | Source: Ambulatory Visit | Attending: Internal Medicine | Admitting: Internal Medicine

## 2016-12-07 DIAGNOSIS — Z955 Presence of coronary angioplasty implant and graft: Secondary | ICD-10-CM | POA: Diagnosis not present

## 2016-12-10 ENCOUNTER — Encounter (HOSPITAL_COMMUNITY)
Admission: RE | Admit: 2016-12-10 | Discharge: 2016-12-10 | Disposition: A | Discharge status: Home / Self Care | Payer: BC Managed Care – PPO | Source: Ambulatory Visit | Attending: Internal Medicine | Admitting: Internal Medicine

## 2016-12-10 DIAGNOSIS — Z955 Presence of coronary angioplasty implant and graft: Secondary | ICD-10-CM

## 2016-12-12 ENCOUNTER — Encounter (HOSPITAL_COMMUNITY)
Admission: RE | Admit: 2016-12-12 | Discharge: 2016-12-12 | Disposition: A | Discharge status: Home / Self Care | Payer: BC Managed Care – PPO | Source: Ambulatory Visit | Attending: Internal Medicine | Admitting: Internal Medicine

## 2016-12-12 DIAGNOSIS — Z955 Presence of coronary angioplasty implant and graft: Secondary | ICD-10-CM | POA: Diagnosis not present

## 2016-12-14 ENCOUNTER — Encounter (HOSPITAL_COMMUNITY): Payer: BC Managed Care – PPO

## 2016-12-17 ENCOUNTER — Encounter (HOSPITAL_COMMUNITY): Payer: BC Managed Care – PPO

## 2016-12-19 ENCOUNTER — Ambulatory Visit (INDEPENDENT_AMBULATORY_CARE_PROVIDER_SITE_OTHER): Payer: BC Managed Care – PPO

## 2016-12-19 ENCOUNTER — Ambulatory Visit (INDEPENDENT_AMBULATORY_CARE_PROVIDER_SITE_OTHER): Payer: BC Managed Care – PPO | Admitting: Sports Medicine

## 2016-12-19 ENCOUNTER — Encounter (HOSPITAL_COMMUNITY): Payer: BC Managed Care – PPO

## 2016-12-19 ENCOUNTER — Encounter: Payer: Self-pay | Admitting: Sports Medicine

## 2016-12-19 DIAGNOSIS — R6889 Other general symptoms and signs: Secondary | ICD-10-CM

## 2016-12-19 DIAGNOSIS — E785 Hyperlipidemia, unspecified: Secondary | ICD-10-CM

## 2016-12-19 DIAGNOSIS — M51369 Other intervertebral disc degeneration, lumbar region without mention of lumbar back pain or lower extremity pain: Secondary | ICD-10-CM

## 2016-12-19 DIAGNOSIS — M1712 Unilateral primary osteoarthritis, left knee: Secondary | ICD-10-CM

## 2016-12-19 DIAGNOSIS — M222X2 Patellofemoral disorders, left knee: Secondary | ICD-10-CM | POA: Insufficient documentation

## 2016-12-19 DIAGNOSIS — M222X1 Patellofemoral disorders, right knee: Secondary | ICD-10-CM | POA: Insufficient documentation

## 2016-12-19 DIAGNOSIS — M5136 Other intervertebral disc degeneration, lumbar region: Secondary | ICD-10-CM

## 2016-12-19 MED ORDER — PREDNISONE 50 MG PO TABS: ORAL_TABLET | ORAL | 0 refills | Status: DC

## 2016-12-19 MED ORDER — TRAMADOL HCL 50 MG PO TABS: 50.0000 mg | ORAL_TABLET | Freq: Two times a day (BID) | ORAL | 2 refills | Status: DC | PRN

## 2016-12-19 NOTE — Assessment & Plan Note (Addendum)
Rechecking CBC, lipid panel, CMP.

## 2016-12-19 NOTE — Assessment & Plan Note (Signed)
Did well after a left L4-L5 facet joint injection, after his myocardial infarction with stenting he cannot come off of his Brilinta for now. This precludes another facet joint injection. Adding prednisone, and tramadol for breakthrough pain, we have to avoid NSAIDs as well. I do think at the follow-up we can do another facet joint injection when his cardiologist clears him coming off of Brilinta, and then likely radiofrequency ablation.

## 2016-12-19 NOTE — Assessment & Plan Note (Signed)
Starting the dementia workup, but also referral to neurology. 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 No brain imaging just yet.

## 2016-12-19 NOTE — Assessment & Plan Note (Signed)
X-rays, rehab exercises. Return in 1 month, injection if no better.

## 2016-12-19 NOTE — Progress Notes (Signed)
  Subjective:    CC: Left knee pain  HPI: Joseph Jacobs returns, he has had pain for the past several months on the anterior aspect of his left knee, mild swelling, pain with going up and down stairs, squatting, mild gelling, really no mechanical symptoms.  Pain is moderate, persistent.  Hyperlipidemia: Due to recheck lipids  Lumbar spondylosis: Did well after left L4-L5 facet joint injection, unfortunately he had a myocardial infarction with stenting, currently on Brilinta and cannot, for a full year.  No bowel or bladder dysfunction, saddle numbness constitutional symptoms will be cannot proceed with spinal or other neuraxial anesthesia or other injections.  Forgetfulness: Mild, absentmindedness, no depressive symptoms per his report.  Past medical history:  Negative.  See flowsheet/record as well for more information.  Surgical history: Negative.  See flowsheet/record as well for more information.  Family history: Negative.  See flowsheet/record as well for more information.  Social history: Negative.  See flowsheet/record as well for more information.  Allergies, and medications have been entered into the medical record, reviewed, and no changes needed.   Review of Systems: No fevers, chills, night sweats, weight loss, chest pain, or shortness of breath.   Objective:    General: Well Developed, well nourished, and in no acute distress.  Neuro: Alert and oriented x3, extra-ocular muscles intact, sensation grossly intact.  HEENT: Normocephalic, atraumatic, pupils equal round reactive to light, neck supple, no masses, no lymphadenopathy, thyroid nonpalpable.  Skin: Warm and dry, no rashes. Cardiac: Regular rate and rhythm, no murmurs rubs or gallops, no lower extremity edema.  Respiratory: Clear to auscultation bilaterally. Not using accessory muscles, speaking in full sentences. Left knee: Normal to inspection with no erythema or effusion or obvious bony abnormalities. Tender to  palpation over the medial and lateral patellar facets, minimal patellar crepitus, there is a palpable parapatellar plica.  Nontender. ROM normal in flexion and extension and lower leg rotation. Ligaments with solid consistent endpoints including ACL, PCL, LCL, MCL. Negative Mcmurray's and provocative meniscal tests. Non painful patellar compression. Patellar and quadriceps tendons unremarkable. Hamstring and quadriceps strength is normal.  Impression and Recommendations:    Osteoarthritis of left patellofemoral joint X-rays, rehab exercises. Return in 1 month, injection if no better.  Lumbar degenerative disc disease Did well after a left L4-L5 facet joint injection, after his myocardial infarction with stenting he cannot come off of his Brilinta for now. This precludes another facet joint injection. Adding prednisone, and tramadol for breakthrough pain, we have to avoid NSAIDs as well. I do think at the follow-up we can do another facet joint injection when his cardiologist clears him coming off of Brilinta, and then likely radiofrequency ablation.  Dyslipidemia Rechecking CBC, lipid panel, CMP.  Forgetfulness Starting the dementia workup, but also referral to neurology. 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 No brain imaging just yet.  ___________________________________________ Joseph Jacobs, M.D., ABFM., CAQSM. Primary Care and Sports Medicine [facility]  Adjunct Instructor of Family Medicine  [employer]

## 2016-12-24 ENCOUNTER — Encounter (HOSPITAL_COMMUNITY): Payer: BC Managed Care – PPO

## 2016-12-24 ENCOUNTER — Ambulatory Visit (INDEPENDENT_AMBULATORY_CARE_PROVIDER_SITE_OTHER): Payer: BC Managed Care – PPO | Admitting: Family Medicine

## 2016-12-24 VITALS — BP 146/78 | HR 86 | Wt 205.0 lb

## 2016-12-24 DIAGNOSIS — T792XXA Traumatic secondary and recurrent hemorrhage and seroma, initial encounter: Secondary | ICD-10-CM | POA: Diagnosis not present

## 2016-12-24 NOTE — Progress Notes (Signed)
Joseph RocherChristopher Jacobs is a 61 y.o. male who [facility] today for right hand swelling. Patient notes acute onset of swelling of the right dorsal hand associated with mild pain starting yesterday or this morning. He can't recall any injury. He takes Brilenta blood thinner due to a drug-eluting stent that was placed in his coronary artery in September 2018. He has not tried any treatment yet. He denies any fevers or chills or recent blood draws in this area.   Past Medical History:  Diagnosis Date  . Anginal pain (HCC)   . Arthritis    "hands, knees, back, rightt foot" (10/11/2016)  . Asthma   . Chronic bronchitis (HCC)    "get it most years" (10/11/2016)  . Chronic lower back pain   . Coronary artery disease   . High cholesterol   . Lumbar degenerative disc disease 12/14/2013  . Migraine    "daily to weekly" (10/11/2016)  . Pneumonia    "several times; last time was in 03/2016" (10/11/2016)  . Sleep apnea   . Stress fracture of foot 12/14/2013   "right"   Past Surgical History:  Procedure Laterality Date  . BILATERAL CARPAL TUNNEL RELEASE Bilateral   . CORONARY ANGIOPLASTY WITH STENT PLACEMENT  10/11/2016  . CORONARY STENT INTERVENTION N/A 10/11/2016   Procedure: CORONARY STENT INTERVENTION;  Surgeon: Runell GessBerry, Jonathan J, MD;  Location: [facility] INVASIVE CV [facility];  Service: Cardiovascular;  Laterality: N/A;  prox RCA  . KNEE ARTHROSCOPY Left 1978  . KNEE CARTILAGE SURGERY Left   . LEFT HEART CATH AND CORONARY ANGIOGRAPHY N/A 10/11/2016   Procedure: LEFT HEART CATH AND CORONARY ANGIOGRAPHY;  Surgeon: Runell GessBerry, Jonathan J, MD;  Location: [facility] INVASIVE CV LAB;  Service: Cardiovascular;  Laterality: N/A;   Social History   Tobacco Use  . Smoking status: Former Smoker    Packs/day: 0.33    Years: 44.00    Pack years: 14.52    Types: Cigarettes  . Smokeless tobacco: Never Used  Substance Use Topics  . Alcohol use: Yes    Alcohol/week: 3.6 oz      Types: 3 Glasses of wine, 3 Standard drinks or equivalent per week     ROS:  As above   Medications: Current Outpatient Medications  Medication Sig Dispense Refill  . acetaminophen (TYLENOL) 325 MG tablet Take 650 mg by mouth every 6 (six) hours as needed.    Marland Kitchen. albuterol (PROVENTIL HFA;VENTOLIN HFA) 108 (90 Base) MCG/ACT inhaler Inhale 2 puffs into the lungs every 6 (six) hours as needed for wheezing or shortness of breath. 1 Inhaler 3  . albuterol (PROVENTIL) (2.5 MG/3ML) 0.083% nebulizer solution Take 3 mLs (2.5 mg total) by nebulization every 6 (six) hours as needed for wheezing or shortness of breath. 75 mL 12  . aspirin EC 81 MG tablet Take 1 tablet (81 mg total) by mouth daily. (Patient taking differently: Take 81 mg by mouth every evening. )    . atorvastatin (LIPITOR) 80 MG tablet Take 1 tablet (80 mg total) by mouth at bedtime. 90 tablet 3  . fluticasone (FLONASE) 50 MCG/ACT nasal spray One spray in each nostril twice a day, use left hand for right nostril, and right hand for left nostril. 48 g 3  . metoprolol succinate (TOPROL XL) 25 MG 24 hr tablet Take 1 tablet (25 mg total) by mouth daily. 90 tablet 3  . montelukast (SINGULAIR) 10 MG tablet Take 1 tablet (10 mg total) by mouth at bedtime.  90 tablet 3  . nitroGLYCERIN (NITROSTAT) 0.4 MG SL tablet Place 1 tablet (0.4 mg total) under the tongue every 5 (five) minutes as needed for chest pain (or tightness). 30 tablet 0  . ticagrelor (BRILINTA) 90 MG TABS tablet Take 1 tablet (90 mg total) by mouth 2 (two) times daily. 60 tablet 11  . traMADol (ULTRAM) 50 MG tablet Take 1 tablet (50 mg total) by mouth 2 (two) times daily as needed. 60 tablet 2   No current facility-administered medications for this visit.    Allergies  Allergen Reactions  . Azithromycin Other (See Comments)    Tingling/swelling in lips/tongue  . E-Mycin [Erythromycin] Nausea Only    GI upset      Exam:  BP (!) 146/78   Pulse 86   Wt 205 lb (93 kg)    BMI 28.59 kg/m  General: Well Developed, well nourished, and in no acute distress.  Neuro/Psych: Alert and oriented x3, extra-ocular muscles intact, able to move all 4 extremities, sensation grossly intact. Skin: Warm and dry, no rashes noted.  Respiratory: Not using accessory muscles, speaking in full sentences, trachea midline.  Cardiovascular: Pulses palpable, no extremity edema. Abdomen: Does not appear distended. MSK: Right hand swollen at the dorsal hand overlying the second and third metacarpals. Tender to palpation. No erythema. Hand motion extension and flexion are intact. Pulses capillary refill and sensation are intact.  Limited musculoskeletal the right dorsal hand reveals a hypoechoic mass overlying the tendon structures with no Doppler flow. This is consistent with seroma in appearance. Normal bony structures.    No results found for this or any previous visit (from the past 48 hour(s)). No results found.    Assessment and Plan: 61 y.o. male with seroma right dorsal hand. Patient likely has some injury he cannot recall and has bled due to [place]. Plan for compression ice and relative rest. Recheck in 1 week with myself or Dr. Benjamin Stainhekkekandam    No orders of the defined types were placed in this encounter.  No orders of the defined types were placed in this encounter.   Discussed warning signs or symptoms. Please see discharge instructions. Patient expresses understanding.  I spent 25 minutes with this patient, greater than 50% was face-to-face time counseling regarding ddx and treatment options.

## 2016-12-24 NOTE — Patient Instructions (Signed)
Thank you for coming in today. I think this is basically a big bruise.  Try ice and compression if able.  Try topical aspercream.  Recheck with me or Dr T in about 1 week.  Return sooner if needed.    Seroma A seroma is a collection of fluid on the body that looks like swelling or a mass. Seromas form where tissue has been injured or cut. Seromas vary in size. Some are small and painless. Others may become large and cause pain or discomfort. Many seromas go away on their own as the fluid is naturally absorbed by the body, and some seromas need to be drained. What are the causes? Seromas form as the result of damage to tissue or the removal of tissue. This tissue damage may occur during surgery or because of an injury or trauma. When tissue is disrupted or removed, empty space is created. The body's natural defense system (immune system) causes fluid to enter the empty space and form a seroma. What are the signs or symptoms? Symptoms of this condition include:  Swelling at the site of a surgical cut (incision) or an injury.  Drainage of clear fluid at the surgery or injury site.  Discomfort or pain.  How is this diagnosed? This condition is diagnosed based on your symptoms, your medical history, and a physical exam. During the exam, your health care provider will press on the seroma. You may also have tests, including:  Blood tests.  Imaging tests, such as an ultrasound or CT scan.  How is this treated? Some seromas go away (resolve) on their own. Your health care provider may monitor you to make sure the seroma does not cause any complications. If your seroma does not resolve on its own, treatment may include:  Using a needle to drain the fluid from the seroma (needle aspiration).  Inserting a flexible tube (catheter) to drain the fluid.  Applying a bandage (dressing), such as an elastic bandage or binder.  Antibiotic medicines, if the seroma becomes infected.  In rare cases,  surgery may be done to remove the seroma and repair the area. Follow these instructions at home:  If you were prescribed an antibiotic medicine, take it as told by your health care provider. Do not stop taking the antibiotic even if you start to feel better.  Return to your normal activities as told by your health care provider. Ask your health care provider what activities are safe for you.  Take over-the-counter and prescription medicines only as told by your health care provider.  Check your seroma every day for signs of infection. Check for: ? Redness or pain. ? Fluid or pus. ? More swelling. ? Warmth.  Keep all follow-up visits as told by your health care provider. This is important. Contact a health care provider if:  You have a fever.  You have redness or pain at the site of the seroma.  You have fluid or pus coming from the seroma.  Your seroma is more swollen or is getting bigger.  Your seroma is warm to the touch. This information is not intended to replace advice given to you by your health care provider. Make sure you discuss any questions you have with your health care provider. Document Released: 05/12/2012 Document Revised: 10/28/2015 Document Reviewed: 10/28/2015 Elsevier Interactive Patient Education  [employer]2018 [employer]

## 2016-12-25 ENCOUNTER — Telehealth: Payer: Self-pay | Admitting: *Deleted

## 2016-12-25 NOTE — Telephone Encounter (Signed)
Pre Authorization sent to cover my meds. QTFUTD

## 2016-12-26 ENCOUNTER — Encounter (HOSPITAL_COMMUNITY)
Admission: RE | Admit: 2016-12-26 | Discharge: 2016-12-26 | Disposition: A | Discharge status: Home / Self Care | Payer: BC Managed Care – PPO | Source: Ambulatory Visit | Attending: Internal Medicine | Admitting: Internal Medicine

## 2016-12-26 DIAGNOSIS — Z955 Presence of coronary angioplasty implant and graft: Secondary | ICD-10-CM

## 2016-12-26 NOTE — Telephone Encounter (Signed)
Tramadol approved from 11/26/2016-12/26/2017. Pharmacy notified .

## 2016-12-26 NOTE — Progress Notes (Signed)
Thayer OhmChris returned to exercise today. Thayer OhmChris did not use his right hand.Will continue to monitor the patient throughout  the program.Matie Dimaano Harlon FlorWhitaker, RN,BSN 12/26/2016 5:17 PM

## 2016-12-27 NOTE — Progress Notes (Signed)
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  acetaminophen (TYLENOL) 325 MG tablet, Take 650 mg by mouth every 6 (six) hours as needed., Disp: , Rfl:  .  albuterol (PROVENTIL HFA;VENTOLIN HFA) 108 (90 Base) MCG/ACT inhaler, Inhale 2 puffs into the lungs every 6 (six) hours as needed for wheezing or shortness of breath., Disp: 1 Inhaler, Rfl: 3 .  albuterol (PROVENTIL) (2.5 MG/3ML) 0.083% nebulizer solution, Take 3 mLs (2.5 mg total) by nebulization every 6 (six) hours as needed for wheezing or shortness of breath., Disp: 75 mL, Rfl: 12 .  aspirin EC 81 MG tablet, Take 1 tablet (81 mg total) by mouth daily. (Patient taking differently: Take 81 mg by mouth every evening. ), Disp: , Rfl:  .  atorvastatin (LIPITOR) 80 MG tablet, Take 1 tablet (80 mg total) by mouth at bedtime., Disp: 90 tablet, Rfl: 3 .  fluticasone (FLONASE) 50 MCG/ACT nasal spray, One spray in each nostril twice a day, use left hand for right nostril, and right hand for left nostril., Disp: 48 g, Rfl: 3 .  metoprolol succinate (TOPROL XL) 25 MG 24 hr tablet, Take 1 tablet (25 mg total) by mouth daily., Disp: 90 tablet, Rfl: 3 .  montelukast (SINGULAIR) 10 MG tablet, Take 1 tablet (10 mg total) by mouth at bedtime., Disp: 90 tablet, Rfl: 3 .  nitroGLYCERIN (NITROSTAT) 0.4 MG SL tablet, Place 1 tablet (0.4 mg total) under the tongue every 5 (five) minutes as needed  for chest pain (or tightness)., Disp: 30 tablet, Rfl: 0 .  ticagrelor (BRILINTA) 90 MG TABS tablet, Take 1 tablet (90 mg total) by mouth 2 (two) times daily., Disp: 60 tablet, Rfl: 11 .  traMADol (ULTRAM) 50 MG tablet, Take 1 tablet (50 mg total) by mouth 2 (two) times daily as needed., Disp: 60 tablet, Rfl: 2  Past Medical History: Past Medical History:  Diagnosis Date  . Anginal pain (HCC)   . Arthritis    "hands, knees, back, rightt foot" (10/11/2016)  . Asthma   . Chronic bronchitis (HCC)    "get it most years" (10/11/2016)  . Chronic lower back pain   . Coronary artery disease   . High cholesterol   . Lumbar degenerative disc disease 12/14/2013  . Migraine    "daily to weekly" (10/11/2016)  . Pneumonia    "several times; last time was in 03/2016" (10/11/2016)  . Sleep apnea   . Stress fracture of foot 12/14/2013   "right"    Tobacco Use: Social History   Tobacco Use  Smoking Status Former Smoker  . Packs/day: 0.33  . Years: 44.00  . Pack years: 14.52  . Types: Cigarettes  Smokeless Tobacco Never Used    Labs: Recent Review Flowsheet Data    Labs for ITP Cardiac and Pulmonary Rehab Latest Ref Rng & Units 07/06/2015 09/26/2016   Cholestrol <200 mg/dL 409 811   LDLCALC <914 mg/dL 782 -  HDL >40 mg/dL 60 47   Trlycerides <161<150 mg/dL 096(E178(H) 454(U188(H)   Hemoglobin A1c <5.7 % - 5.9(H)      Capillary Blood Glucose: Lab Results  Component Value Date   GLUCAP 85 10/11/2016     Exercise Target Goals:    Exercise Program Goal: Individual exercise prescription set with THRR, safety & activity barriers. Participant demonstrates ability to understand and report RPE using BORG scale, to self-measure pulse accurately, and to acknowledge the importance of the exercise prescription.  Exercise Prescription Goal: Starting with aerobic activity 30 plus minutes a day, 3 days per week for initial exercise prescription. Provide home exercise prescription and guidelines that  participant acknowledges understanding prior to discharge.  Activity Barriers & Risk Stratification: Activity Barriers & Cardiac Risk Stratification - 11/01/16 1434      Activity Barriers & Cardiac Risk Stratification   Activity Barriers  Arthritis;Back Problems;Other (comment)    Comments  stress fracture in R foot, arthritis in hands, B knee scope    Cardiac Risk Stratification  High       6 Minute Walk: 6 Minute Walk    Row Name 11/01/16 1552         6 Minute Walk   Phase  Initial     Distance  1704 feet     Walk Time  6 minutes     # of Rest Breaks  0     MPH  3.22     METS  4.05     RPE  12     VO2 Peak  14.2     Symptoms  Yes (comment)     Comments  pt c/o dizziness and lightheadedness at the end of walk test     Resting HR  66 bpm     Resting BP  104/60     Resting Oxygen Saturation   97 %     Exercise Oxygen Saturation  during 6 min walk  98 %     Max Ex. HR  102 bpm     Max Ex. BP  124/72     2 Minute Post BP  102/60        Oxygen Initial Assessment:   Oxygen Re-Evaluation:   Oxygen Discharge (Final Oxygen Re-Evaluation):   Initial Exercise Prescription: Initial Exercise Prescription - 11/01/16 1500      Date of Initial Exercise RX and Referring Provider   Date  11/01/16    Referring Provider  Dr Italyhad Hilty      Treadmill   MPH  3    Grade  0    Minutes  10    METs  3.3      Bike   Level  1    Minutes  10    METs  3.1      NuStep   Level  3    SPM  80    Minutes  10    METs  2.5      Prescription Details   Frequency (times per week)  3    Duration  Progress to 30 minutes of continuous aerobic without signs/symptoms of physical distress      Intensity   THRR 40-80% of Max Heartrate  64-128    Ratings of Perceived Exertion  11-13    Perceived Dyspnea  0-4      Progression   Progression  Continue to progress workloads to maintain intensity without signs/symptoms of physical distress.      Resistance Training  Training  Prescription  Yes    Weight  4lbs    Reps  10-15       Perform Capillary Blood Glucose checks as needed.  Exercise Prescription Changes: Exercise Prescription Changes    Row Name 11/23/16 1600 11/26/16 1600 12/12/16 1600 12/26/16 1503       Response to Exercise   Blood Pressure (Admit)  118/78  110/70  112/82  128/76    Blood Pressure (Exercise)  130/82  132/70  148/82  134/80    Blood Pressure (Exit)  118/78  100/60  104/60  108/64    Heart Rate (Admit)  78 bpm  86 bpm  85 bpm  79 bpm    Heart Rate (Exercise)  103 bpm  102 bpm  115 bpm  99 bpm    Heart Rate (Exit)  71 bpm  85 bpm  83 bpm  72 bpm    Rating of Perceived Exertion (Exercise)  13  13  13  13     Symptoms  none  none  none  none    Duration  Continue with 30 min of aerobic exercise without signs/symptoms of physical distress.  Continue with 30 min of aerobic exercise without signs/symptoms of physical distress.  Continue with 30 min of aerobic exercise without signs/symptoms of physical distress.  Continue with 30 min of aerobic exercise without signs/symptoms of physical distress.    Intensity  THRR unchanged  THRR unchanged  THRR unchanged  THRR unchanged      Progression   Progression  Continue to progress workloads to maintain intensity without signs/symptoms of physical distress.  Continue to progress workloads to maintain intensity without signs/symptoms of physical distress.  Continue to progress workloads to maintain intensity without signs/symptoms of physical distress.  Continue to progress workloads to maintain intensity without signs/symptoms of physical distress.    Average METs  3.1  2.8  3.7  4.3      Resistance Training   Training Prescription  Yes  Yes  Yes  No Relaxation today    Weight  5lbs  4lbs  5lbs  -    Reps  10-15  10-15  10-15  -    Time  10 Minutes  10 Minutes  10 Minutes  -      Interval Training   Interval Training  No  No  No  No      Treadmill   MPH  -  -  -  -    Grade  -  -  -  -     Minutes  -  -  -  -    METs  -  -  -  -      Bike   Level  1.1  1.2  1.2  4    Minutes  10  10  10  10     METs  3.31  3.5  3.5  4.8      NuStep   Level  4  4  5  5     SPM  80  80  80  80    Minutes  10  10  10  20     METs  3.4  2.7  5.1  3.7      Arm Ergometer   Level  2  2  2.5  -    Minutes  10  10  10   -    METs  2.46  2.2  2.5  -  Home Exercise Plan   Plans to continue exercise at  -  Home (comment)  Home (comment)  Home (comment)    Frequency  -  Add 2 additional days to program exercise sessions.  Add 2 additional days to program exercise sessions.  Add 2 additional days to program exercise sessions.    Initial Home Exercises Provided  -  11/26/16  11/26/16  11/26/16       Exercise Comments: Exercise Comments    Row Name 11/23/16 1635 11/26/16 1500 12/26/16 1517       Exercise Comments  Off to a good start with exercise.  Reviewed home exercise guidelines with patient.  Reviewed METs and goals with patient.        Exercise Goals and Review: Exercise Goals    Row Name 11/01/16 1434             Exercise Goals   Increase Physical Activity  Yes       Intervention  Provide advice, education, support and counseling about physical activity/exercise needs.;Develop an individualized exercise prescription for aerobic and resistive training based on initial evaluation findings, risk stratification, comorbidities and participant's personal goals.       Expected Outcomes  Achievement of increased cardiorespiratory fitness and enhanced flexibility, muscular endurance and strength shown through measurements of functional capacity and personal statement of participant.       Increase Strength and Stamina  Yes improve endurance to continue to play golf       Intervention  Provide advice, education, support and counseling about physical activity/exercise needs.;Develop an individualized exercise prescription for aerobic and resistive training based on initial evaluation  findings, risk stratification, comorbidities and participant's personal goals.       Expected Outcomes  Achievement of increased cardiorespiratory fitness and enhanced flexibility, muscular endurance and strength shown through measurements of functional capacity and personal statement of participant.       Able to understand and use rate of perceived exertion (RPE) scale  Yes       Intervention  Provide education and explanation on how to use RPE scale       Expected Outcomes  Short Term: Able to use RPE daily in rehab to express subjective intensity level;Long Term:  Able to use RPE to guide intensity level when exercising independently       Knowledge and understanding of Target Heart Rate Range (THRR)  Yes       Intervention  Provide education and explanation of THRR including how the numbers were predicted and where they are located for reference       Expected Outcomes  Short Term: Able to state/look up THRR;Long Term: Able to use THRR to govern intensity when exercising independently;Short Term: Able to use daily as guideline for intensity in rehab       Able to check pulse independently  Yes       Intervention  Provide education and demonstration on how to check pulse in carotid and radial arteries.;Review the importance of being able to check your own pulse for safety during independent exercise       Expected Outcomes  Short Term: Able to explain why pulse checking is important during independent exercise;Long Term: Able to check pulse independently and accurately       Understanding of Exercise Prescription  Yes       Intervention  Provide education, explanation, and written materials on patient's individual exercise prescription       Expected Outcomes  Short Term:  Able to explain program exercise prescription;Long Term: Able to explain home exercise prescription to exercise independently          Exercise Goals Re-Evaluation : Exercise Goals Re-Evaluation    Row Name 11/23/16 1634  11/26/16 1500 12/26/16 1517         Exercise Goal Re-Evaluation   Exercise Goals Review  Able to understand and use rate of perceived exertion (RPE) scale  Understanding of Exercise Prescription;Knowledge and understanding of Target Heart Rate Range (THRR)  Increase Physical Activity     Comments  Patient is able to understand and use RPE scale appropriately.  Reviewed home exercise guidelines with patient including THRR, RPE scale, and endpoints for exercise. Patient is walking about 10 minutes and is limited by stress fracture in right foot. Pt is wearing boot and is looking into getting a recumbent bike for home use.  Patient is doing well with exercise. Still wearing boot and additionally injured hand so no arm ergometer. Moved from sch[employer] airdyne to scifit upright bike.     Expected Outcomes  Increase workloads as tolerated based perceived exertion and THRR.  Walk as tolerated, 1-2 days/week.  Increase workloads as tolerated.         Discharge Exercise Prescription (Final Exercise Prescription Changes): Exercise Prescription Changes - 12/26/16 1503      Response to Exercise   Blood Pressure (Admit)  128/76    Blood Pressure (Exercise)  134/80    Blood Pressure (Exit)  108/64    Heart Rate (Admit)  79 bpm    Heart Rate (Exercise)  99 bpm    Heart Rate (Exit)  72 bpm    Rating of Perceived Exertion (Exercise)  13    Symptoms  none    Duration  Continue with 30 min of aerobic exercise without signs/symptoms of physical distress.    Intensity  THRR unchanged      Progression   Progression  Continue to progress workloads to maintain intensity without signs/symptoms of physical distress.    Average METs  4.3      Resistance Training   Training Prescription  No Relaxation today      Interval Training   Interval Training  No      Bike   Level  4    Minutes  10    METs  4.8      NuStep   Level  5    SPM  80    Minutes  20    METs  3.7      Home Exercise Plan   Plans to  continue exercise at  Home (comment)    Frequency  Add 2 additional days to program exercise sessions.    Initial Home Exercises Provided  11/26/16       Nutrition:  Target Goals: Understanding of nutrition guidelines, daily intake of sodium 1500mg , cholesterol 200mg , calories 30% from fat and 7% or less from saturated fats, daily to have 5 or more servings of fruits and vegetables.  Biometrics: Pre Biometrics - 11/01/16 1628      Pre Biometrics   Height  5' 9.75" (1.772 m)    Weight  201 lb 11.5 oz (91.5 kg)    Waist Circumference  40.5 inches    Hip Circumference  41 inches    Waist to Hip Ratio  0.99 %    BMI (Calculated)  29.14    Triceps Skinfold  20 mm    % Body Fat  29 %  Grip Strength  46 kg    Flexibility  0 in    Single Leg Stand  30 seconds        Nutrition Therapy Plan and Nutrition Goals: Nutrition Therapy & Goals - 11/07/16 1036      Nutrition Therapy   Diet  Therapeutic Lifestyle Changes      Personal Nutrition Goals   Nutrition Goal  Pt to identify and limit food sources of saturated fat, trans fat, and sodium    Personal Goal #2  Pt to identify food quantities necessary to achieve weight loss of 6-24 lb (2.7-10.9 kg) at graduation from cardiac rehab.       Intervention Plan   Intervention  Prescribe, educate and counsel regarding individualized specific dietary modifications aiming towards targeted core components such as weight, hypertension, lipid management, diabetes, heart failure and other comorbidities.    Expected Outcomes  Short Term Goal: Understand basic principles of dietary content, such as calories, fat, sodium, cholesterol and nutrients.;Long Term Goal: Adherence to prescribed nutrition plan.       Nutrition Discharge: Nutrition Scores:   Nutrition Goals Re-Evaluation:   Nutrition Goals Re-Evaluation:   Nutrition Goals Discharge (Final Nutrition Goals Re-Evaluation):   Psychosocial: Target Goals: Acknowledge presence or  absence of significant depression and/or stress, maximize coping skills, provide positive support system. Participant is able to verbalize types and ability to use techniques and skills needed for reducing stress and depression.  Initial Review & Psychosocial Screening: Initial Psych Review & Screening - 11/01/16 1624      Initial Review   Current issues with  None Identified      Family Dynamics   Good Support System?  Yes      Barriers   Psychosocial barriers to participate in program  There are no identifiable barriers or psychosocial needs.      Screening Interventions   Interventions  Encouraged to exercise       Quality of Life Scores: Quality of Life - 11/01/16 1603      Quality of Life Scores   Health/Function Pre  21.57 %    Socioeconomic Pre  26.43 %    Psych/Spiritual Pre  25 %    Family Pre  28.8 %    GLOBAL Pre  24.34 %       PHQ-9: Recent Review Flowsheet Data    Depression screen [facility] 2/9 10/16/2016 09/03/2016   Decreased Interest 1 0   Down, Depressed, Hopeless 1 0   PHQ - 2 Score 2 0   Altered sleeping 0 -   Tired, decreased energy 2 -   Change in appetite 0 -   Feeling bad or failure about yourself  0 -   Trouble concentrating 2 -   Moving slowly or fidgety/restless 2 -   Suicidal thoughts 0 -   PHQ-9 Score 8 -     Interpretation of Total Score  Total Score Depression Severity:  1-4 = Minimal depression, 5-9 = Mild depression, 10-14 = Moderate depression, 15-19 = Moderately severe depression, 20-27 = Severe depression   Psychosocial Evaluation and Intervention:   Psychosocial Re-Evaluation: Psychosocial Re-Evaluation    Row Name 11/29/16 1536 11/29/16 1537 12/27/16 1805         Psychosocial Re-Evaluation   Current issues with  None Identified  None Identified  None Identified     Interventions  Encouraged to attend Cardiac Rehabilitation for the exercise  Encouraged to attend Cardiac Rehabilitation for the exercise  Encouraged to attend  Cardiac  Rehabilitation for the exercise     Continue Psychosocial Services   -  No Follow up required  No Follow up required        Psychosocial Discharge (Final Psychosocial Re-Evaluation): Psychosocial Re-Evaluation - 12/27/16 1805      Psychosocial Re-Evaluation   Current issues with  None Identified    Interventions  Encouraged to attend Cardiac Rehabilitation for the exercise    Continue Psychosocial Services   No Follow up required       Vocational Rehabilitation: Provide vocational rehab assistance to qualifying candidates.   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 Participant will state understanding/return demonstration of topics presented.  Learning Barriers/Preferences: Learning Barriers/Preferences - 11/01/16 1433      Learning Barriers/Preferences   Learning Barriers  Sight    Learning Preferences  Video;Pictoral;Computer/Internet       Education Topics: Count Your Pulse:  -Group instruction provided by verbal instruction, demonstration, patient participation and written materials to support subject.  Instructors address importance of being able to find your pulse and how to count your pulse when at home without a heart monitor.  Patients get hands on experience counting their pulse with staff help and individually.   CARDIAC REHAB PHASE II EXERCISE from 12/26/2016 in [facility] CARDIAC REHAB  Date  11/30/16  Instruction Review Code  2- meets goals/outcomes      Heart Attack, Angina, and Risk Factor Modification:  -Group instruction provided by verbal instruction, video, and written materials to support subject.  Instructors  address signs and symptoms of angina and heart attacks.    Also discuss risk factors for heart disease and how to make changes to improve heart health risk factors.   Functional Fitness:  -Group instruction provided by verbal instruction, demonstration, patient participation, and written materials to support subject.  Instructors address safety measures for doing things around the house.  Discuss how to get up and down off the floor, how to pick things up properly, how to safely get out of a chair without assistance, and balance training.   Meditation and Mindfulness:  -Group instruction provided by verbal instruction, patient participation, and written materials to support subject.  Instructor addresses importance of mindfulness and meditation practice to help reduce stress and improve awareness.  Instructor also leads participants through a meditation exercise.    Stretching for Flexibility and Mobility:  -Group instruction provided by verbal instruction, patient participation, and written materials to support subject.  Instructors lead participants through series of stretches that are designed to increase flexibility thus improving mobility.  These stretches are additional exercise for major muscle groups that are typically performed during regular warm up and cool down.   CARDIAC REHAB PHASE II EXERCISE from 12/26/2016 in [facility] CARDIAC REHAB  Date  11/21/16  Educator  EP  Instruction Review Code  2- meets goals/outcomes      Hands Only CPR:  -Group verbal, video, and participation provides a basic overview of AHA guidelines for community CPR. Role-play of emergencies allow participants the opportunity to practice calling for help and chest compression technique with discussion of AED use.   Hypertension: -Group verbal and written instruction that  provides a basic overview of hypertension including the most recent diagnostic guidelines, risk factor reduction with  self-care instructions and medication management.    Nutrition I class: Heart Healthy Eating:  -Group instruction provided by PowerPoint slides, verbal discussion, and written materials to support subject matter. The instructor gives an explanation and review of the Therapeutic Lifestyle Changes diet recommendations, which includes a discussion on lipid goals, dietary fat, sodium, fiber, plant stanol/sterol esters, sugar, and the components of a well-balanced, healthy diet.   Nutrition II class: Lifestyle Skills:  -Group instruction provided by PowerPoint slides, verbal discussion, and written materials to support subject matter. The instructor gives an explanation and review of label reading, grocery shopping for heart health, heart healthy recipe modifications, and ways to make healthier choices when eating out.   Diabetes Question & Answer:  -Group instruction provided by PowerPoint slides, verbal discussion, and written materials to support subject matter. The instructor gives an explanation and review of diabetes co-morbidities, pre- and post-prandial blood glucose goals, pre-exercise blood glucose goals, signs, symptoms, and treatment of hypoglycemia and hyperglycemia, and foot care basics.   Diabetes Blitz:  -Group instruction provided by PowerPoint slides, verbal discussion, and written materials to support subject matter. The instructor gives an explanation and review of the physiology behind type 1 and type 2 diabetes, diabetes medications and rational behind using different medications, pre- and post-prandial blood glucose recommendations and Hemoglobin A1c goals, diabetes diet, and exercise including blood glucose guidelines for exercising safely.    Portion Distortion:  -Group instruction provided by PowerPoint slides, verbal discussion, written materials, and food models to support subject matter. The instructor gives an explanation of serving size versus portion size, changes in  portions sizes over the last 20 years, and what consists of a serving from each food group.   Stress Management:  -Group instruction provided by verbal instruction, video, and written materials to support subject matter.  Instructors review role of stress in heart disease and how to cope with stress positively.     CARDIAC REHAB PHASE II EXERCISE from 12/26/2016 in [facility] CARDIAC REHAB  Date  11/23/16  Instruction Review Code  2- meets goals/outcomes      Exercising on Your Own:  -Group instruction provided by verbal instruction, power point, and written materials to support subject.  Instructors discuss benefits of exercise, components of exercise, frequency and intensity of exercise, and end points for exercise.  Also discuss use of nitroglycerin and activating EMS.  Review options of places to exercise outside of rehab.  Review guidelines for sex with heart disease.   Cardiac Drugs I:  -Group instruction provided by verbal instruction and written materials to support subject.  Instructor reviews cardiac drug classes: antiplatelets, anticoagulants, beta blockers, and statins.  Instructor discusses reasons, side effects, and lifestyle considerations for each drug class.   Cardiac Drugs II:  -Group instruction provided by verbal instruction and written materials to support subject.  Instructor reviews cardiac drug classes: angiotensin converting enzyme inhibitors (ACE-I), angiotensin II receptor blockers (ARBs), nitrates, and calcium channel blockers.  Instructor discusses reasons, side effects, and lifestyle considerations for each drug class.   Anatomy and Physiology of the Circulatory System:  Group verbal and written instruction and models provide basic cardiac anatomy and physiology, with the coronary electrical and arterial systems. Review of: AMI, Angina, Valve disease, Heart Failure, Peripheral Artery Disease, Cardiac Arrhythmia, Pacemakers, and the ICD.    CARDIAC REHAB PHASE II EXERCISE from 12/26/2016 in [facility]  Date  12/26/16  Instruction Review Code  2- meets goals/outcomes      Other Education:  -Group or individual verbal, written, or video instructions that support the educational goals of the cardiac rehab program.   Knowledge Questionnaire Score: Knowledge Questionnaire Score - 11/01/16 1551      Knowledge Questionnaire Score   Pre Score  24/28       Core Components/Risk Factors/Patient Goals at Admission: Personal Goals and Risk Factors at Admission - 11/01/16 1629      Core Components/Risk Factors/Patient Goals on Admission    Weight Management  Yes;Weight Loss;Weight Maintenance    Intervention  Weight Management: Provide education and appropriate resources to help participant work on and attain dietary goals.;Weight Management: Develop a combined nutrition and exercise program designed to reach desired caloric intake, while maintaining appropriate intake of nutrient and fiber, sodium and fats, and appropriate energy expenditure required for the weight goal.;Weight Management/Obesity: Establish reasonable short term and long term weight goals.    Expected Outcomes  Short Term: Continue to assess and modify interventions until short term weight is achieved;Weight Loss: Understanding of general recommendations for a balanced deficit meal plan, which promotes 1-2 lb weight loss per week and includes a negative energy balance of [phone] kcal/d;Weight Maintenance: Understanding of the daily nutrition guidelines, which includes 25-35% calories from fat, 7% or less cal from saturated fats, less than 200mg  cholesterol, less than 1.5gm of sodium, & 5 or more servings of fruits and vegetables daily;Long Term: Adherence to nutrition and physical activity/exercise program aimed toward attainment of established weight goal;Understanding recommendations for meals to include 15-35% energy as protein, 25-35%  energy from fat, 35-60% energy from carbohydrates, less than 200mg  of dietary cholesterol, 20-35 gm of total fiber daily;Understanding of distribution of calorie intake throughout the day with the consumption of 4-5 meals/snacks    Tobacco Cessation  Yes    Number of packs per day  quit 10/11/16  smoked for 30 years at least 0.25 ppd    Intervention  Assist the participant in steps to quit. Provide individualized education and counseling about committing to Tobacco Cessation, relapse prevention, and pharmacological support that can be provided by physician.;Education officer, environmental, assist with locating and accessing local/national Quit Smoking programs, and support quit date choice.    Expected Outcomes  Short Term: Will demonstrate readiness to quit, by selecting a quit date.;Short Term: Will quit all tobacco product use, adhering to prevention of relapse plan.;Long Term: Complete abstinence from all tobacco products for at least 12 months from quit date.    Lipids  Yes    Intervention  Provide education and support for participant on nutrition & aerobic/resistive exercise along with prescribed medications to achieve LDL 70mg , HDL >40mg .    Expected Outcomes  Short Term: Participant states understanding of desired cholesterol values and is compliant with medications prescribed. Participant is following exercise prescription and nutrition guidelines.;Long Term: Cholesterol controlled with medications as prescribed, with individualized exercise RX and with personalized nutrition plan. Value goals: LDL < 70mg , HDL > 40 mg.    Stress  Yes    Intervention  Refer participants experiencing significant psychosocial distress to appropriate mental health specialists for further evaluation and treatment. When possible, include family members and significant others in education/counseling sessions.       Core Components/Risk Factors/Patient Goals Review:  Goals and Risk Factor Review    Row Name 11/29/16  1524 12/27/16 1805  Core Components/Risk Factors/Patient Goals Review   Personal Goals Review  Weight Management/Obesity;Lipids;Tobacco Cessation  Weight Management/Obesity;Lipids;Tobacco Cessation      Review  Joseph Jacobs is doing well with exercise. Vital signs have been stable.  Joseph Jacobs is doing well with exercise. Vital signs have been stable.      Expected Outcomes  Joseph Jacobs will continue to come to cardiac rehab for exericse and take his medicaitons as presribed. Will continue to encourage smoking cessation.  Joseph Jacobs will continue to come to cardiac rehab for exericse and take his medicaitons as presribed. Will continue to encourage smoking cessation.         Core Components/Risk Factors/Patient Goals at Discharge (Final Review):  Goals and Risk Factor Review - 12/27/16 1805      Core Components/Risk Factors/Patient Goals Review   Personal Goals Review  Weight Management/Obesity;Lipids;Tobacco Cessation    Review  Joseph Jacobs is doing well with exercise. Vital signs have been stable.    Expected Outcomes  Joseph Jacobs will continue to come to cardiac rehab for exericse and take his medicaitons as presribed. Will continue to encourage smoking cessation.       ITP Comments: ITP Comments    Row Name 11/01/16 1419 12/27/16 1802 12/27/16 1804       ITP Comments  Dr. Armanda Magic, Medical Director  30 Day ITP Review  30 Day ITP Review, Joseph Jacobs has had good participation in phase 2 cardiac rehab when he is able to attend. Joseph Jacobs has been out due to a recent R hand hematoma        Comments: See ITP comment.Gladstone Lighter, RN,BSN 12/27/2016 6:08 PM

## 2016-12-28 ENCOUNTER — Encounter (HOSPITAL_COMMUNITY)
Admission: RE | Admit: 2016-12-28 | Discharge: 2016-12-28 | Disposition: A | Discharge status: Home / Self Care | Payer: BC Managed Care – PPO | Source: Ambulatory Visit | Attending: Internal Medicine | Admitting: Internal Medicine

## 2016-12-28 DIAGNOSIS — Z955 Presence of coronary angioplasty implant and graft: Secondary | ICD-10-CM

## 2016-12-31 ENCOUNTER — Encounter: Payer: Self-pay | Admitting: Sports Medicine

## 2016-12-31 ENCOUNTER — Encounter (HOSPITAL_COMMUNITY): Payer: BC Managed Care – PPO

## 2016-12-31 ENCOUNTER — Ambulatory Visit (INDEPENDENT_AMBULATORY_CARE_PROVIDER_SITE_OTHER): Payer: BC Managed Care – PPO | Admitting: Sports Medicine

## 2016-12-31 ENCOUNTER — Other Ambulatory Visit: Payer: Self-pay | Admitting: Sports Medicine

## 2016-12-31 VITALS — BP 130/76 | HR 76 | Resp 16 | Wt 208.0 lb

## 2016-12-31 DIAGNOSIS — M7551 Bursitis of right shoulder: Secondary | ICD-10-CM

## 2016-12-31 DIAGNOSIS — M1811 Unilateral primary osteoarthritis of first carpometacarpal joint, right hand: Secondary | ICD-10-CM | POA: Insufficient documentation

## 2016-12-31 DIAGNOSIS — M25441 Effusion, right hand: Secondary | ICD-10-CM

## 2016-12-31 DIAGNOSIS — Z23 Encounter for immunization: Secondary | ICD-10-CM

## 2016-12-31 DIAGNOSIS — R079 Chest pain, unspecified: Secondary | ICD-10-CM

## 2016-12-31 LAB — COMPREHENSIVE METABOLIC PANEL WITH GFR
AST: 19 U/L (ref 10–35)
Albumin: 4.2 g/dL (ref 3.6–5.1)
Alkaline phosphatase (APISO): 76 U/L (ref 40–115)
Calcium: 9.3 mg/dL (ref 8.6–10.3)
Chloride: 106 mmol/L (ref 98–110)
Globulin: 2 g/dL (ref 1.9–3.7)
Potassium: 4.2 mmol/L (ref 3.5–5.3)
Total Bilirubin: 0.4 mg/dL (ref 0.2–1.2)

## 2016-12-31 LAB — CBC
HCT: 41.8 % (ref 38.5–50.0)
Hemoglobin: 14.6 g/dL (ref 13.2–17.1)
MCH: 32 pg (ref 27.0–33.0)
MCHC: 34.9 g/dL (ref 32.0–36.0)
MCV: 91.7 fL (ref 80.0–100.0)
MPV: 11.5 fL (ref 7.5–12.5)
Platelets: 319 Thousand/uL (ref 140–400)
RBC: 4.56 Million/uL (ref 4.20–5.80)
RDW: 12.1 % (ref 11.0–15.0)
WBC: 8.4 10*3/uL (ref 3.8–10.8)

## 2016-12-31 LAB — LIPID PANEL W/REFLEX DIRECT LDL
Cholesterol: 130 mg/dL (ref <200)
HDL: 63 mg/dL (ref >40)
LDL Cholesterol (Calc): 52 mg/dL (calc)
Non-HDL Cholesterol (Calc): 67 mg/dL (calc) (ref <130)
Total CHOL/HDL Ratio: 2.1 (calc) (ref <5.0)
Triglycerides: 72 mg/dL (ref <150)

## 2016-12-31 LAB — VITAMIN B12: Vitamin B-12: 444 pg/mL (ref 200–1100)

## 2016-12-31 LAB — COMPREHENSIVE METABOLIC PANEL
AG Ratio: 2.1 (calc) (ref 1.0–2.5)
ALT: 28 U/L (ref 9–46)
BUN: 15 mg/dL (ref 7–25)
CO2: 23 mmol/L (ref 20–32)
Creat: 0.79 mg/dL (ref 0.70–1.25)
Glucose, Bld: 173 mg/dL — ABNORMAL HIGH (ref 65–99)
Sodium: 140 mmol/L (ref 135–146)
Total Protein: 6.2 g/dL (ref 6.1–8.1)

## 2016-12-31 LAB — RPR: RPR Ser Ql: NONREACTIVE

## 2016-12-31 LAB — FOLATE: Folate: 19.7 ng/mL

## 2016-12-31 LAB — TSH: TSH: 0.95 mIU/L (ref 0.40–4.50)

## 2016-12-31 LAB — SEDIMENTATION RATE: Sed Rate: 2 mm/h (ref 0–20)

## 2016-12-31 NOTE — Assessment & Plan Note (Signed)
Likely just a contusion. No collection to drain on ultrasound. Continue icing, massage, no further treatment needed.

## 2016-12-31 NOTE — Progress Notes (Signed)
  Subjective:    CC: Right shoulder pain  HPI: This is a pleasant 61 year old male, he has had a couple of days of right shoulder pain, severe, localized over the deltoid and worse with overhead activities, no trauma.  Right hand swelling: Accidental trauma, then developed some bruising and swelling.  It is improved considerably on its own.  Past medical history:  Negative.  See flowsheet/record as well for more information.  Surgical history: Negative.  See flowsheet/record as well for more information.  Family history: Negative.  See flowsheet/record as well for more information.  Social history: Negative.  See flowsheet/record as well for more information.  Allergies, and medications have been entered into the medical record, reviewed, and no changes needed.   Review of Systems: No fevers, chills, night sweats, weight loss, chest pain, or shortness of breath.   Objective:    General: Well Developed, well nourished, and in no acute distress.  Neuro: Alert and oriented x3, extra-ocular muscles intact, sensation grossly intact.  HEENT: Normocephalic, atraumatic, pupils equal round reactive to light, neck supple, no masses, no lymphadenopathy, thyroid nonpalpable.  Skin: Warm and dry, no rashes. Cardiac: Regular rate and rhythm, no murmurs rubs or gallops, no lower extremity edema.  Respiratory: Clear to auscultation bilaterally. Not using accessory muscles, speaking in full sentences. Right shoulder: Inspection reveals no abnormalities, atrophy or asymmetry. Palpation is normal with no tenderness over AC joint or bicipital groove. ROM is full in all planes. Rotator cuff strength normal throughout. Positive Neer and Hawkin's tests, empty can. Speeds and Yergason's tests normal. No labral pathology noted with negative Obrien's, negative crank, negative clunk, and good stability. Normal scapular function observed. No painful arc and no drop arm sign. No apprehension sign Right hand:  There is a contusion, with likely subcutaneous hematoma.  Unofficial ultrasound showed no collections that would be amenable to aspiration.  Procedure: Real-time Ultrasound Guided Injection of right subacromial bursa Device: GE Logiq E  Verbal informed consent obtained.  Time-out conducted.  Noted no overlying erythema, induration, or other signs of local infection.  Skin prepped in a sterile fashion.  Local anesthesia: Topical Ethyl chloride.  With sterile technique and under real time ultrasound guidance: Using a 25-gauge needle advanced into the bursa and injected 1 cc kenalog 40, 1 cc lidocaine, 1 cc bupivacaine Completed without difficulty  Pain immediately resolved suggesting accurate placement of the medication.  Advised to call if fevers/chills, erythema, induration, drainage, or persistent bleeding.  Images permanently stored and available for review in the ultrasound unit.  Impression: Technically successful ultrasound guided injection.  Impression and Recommendations:    Subacromial bursitis of right shoulder joint Severe pain, injection as above. Return in 1 month.  Swelling of joint of hand, right Likely just a contusion. No collection to drain on ultrasound. Continue icing, massage, no further treatment needed. ___________________________________________ Ihor Austinhomas J. Benjamin Stainhekkekandam, M.D., ABFM., CAQSM. Primary Care and Sports Medicine [facility]  Adjunct Instructor of Family Medicine  [employer]

## 2016-12-31 NOTE — Assessment & Plan Note (Signed)
Severe pain, injection as above. Return in 1 month.

## 2017-01-02 ENCOUNTER — Encounter (HOSPITAL_COMMUNITY)
Admission: RE | Admit: 2017-01-02 | Discharge: 2017-01-02 | Disposition: A | Discharge status: Home / Self Care | Payer: BC Managed Care – PPO | Source: Ambulatory Visit | Attending: Internal Medicine | Admitting: Internal Medicine

## 2017-01-02 DIAGNOSIS — Z48812 Encounter for surgical aftercare following surgery on the circulatory system: Secondary | ICD-10-CM | POA: Diagnosis not present

## 2017-01-02 DIAGNOSIS — Z955 Presence of coronary angioplasty implant and graft: Secondary | ICD-10-CM | POA: Insufficient documentation

## 2017-01-02 NOTE — Progress Notes (Signed)
Joseph RocherChristopher Jacobs 61 y.o. male DOB 08/27/1955 MRN [id number]       Nutrition  1. Status post coronary artery stent placement 10/12/16 DES RCA   Note Spoke with pt. Nutrition Plan and Nutrition Survey goals reviewed with pt. Pt is following a Heart Healthy diet. Pt expressed understanding of the information reviewed. Pt aware of nutrition education classes offered.  Nutrition Diagnosis ? Food-and nutrition-related knowledge deficit related to lack of exposure to information as related to diagnosis of: ? CVD ? Pre-diabetes ? Overweight related to excessive energy intake as evidenced by a BMI of 28.3  Nutrition Intervention ? Pt's individual nutrition plan reviewed with pt. ? Benefits of adopting Heart Healthy diet discussed when Medficts reviewed.   ? Pt given handouts for: ? Nutrition I class ? Nutrition II class   Nutrition Goal(s):  ? Pt to identify and limit food sources of saturated fat, trans fat, and sodium ? Pt to identify food quantities necessary to achieve weight loss of 6-24 lb (2.7-10.9 kg) at graduation from cardiac rehab.   Plan:  Pt to attend nutrition classes ? Nutrition I ? Nutrition II ? Portion Distortion  Will provide client-centered nutrition education as part of interdisciplinary care.   Monitor and evaluate progress toward nutrition goal with team.  Mickle PlumbEdna Amarya Kuehl, M.Ed, RD, LDN, CDE 01/02/2017 4:02 PM

## 2017-01-03 ENCOUNTER — Encounter: Payer: Self-pay | Admitting: Neurology

## 2017-01-03 ENCOUNTER — Ambulatory Visit (INDEPENDENT_AMBULATORY_CARE_PROVIDER_SITE_OTHER): Payer: BC Managed Care – PPO | Admitting: Neurology

## 2017-01-03 DIAGNOSIS — R413 Other amnesia: Secondary | ICD-10-CM

## 2017-01-03 NOTE — Progress Notes (Signed)
Reason for visit: Memory disturbance  Referring physician: Dr. Trinna Balloonhekkekandam  Joseph Jacobs is a 61 y.o. male  History of present illness:  Joseph Jacobs is a 61 year old right-handed white male with a history of a memory disturbance that dates back about 2 years.  The patient has been on and off a statin drug, but his memory has continued to progress slowly over time regardless.  The patient had to go back on a statin medication after a heart attack in September 2018.  The patient is getting cardiac rehab currently.  The patient has noted some gradual decline in his ability to remember names of people and numbers.  He has a lot of problems with word finding.  He uses a GPS with driving but he believes that his sense of direction has also changed over time.  The patient is able to manage his finances, he is able to keep up with medications and appointments fairly well.  He continues to work, he has had some difficulty keeping up with meetings, or focusing during a meeting.  The patient claims that he sleeps well at night, he has a good energy level during the day.  He denies any numbness or weakness of the face, arms, or legs.  He denies any balance issues or difficulty controlling the bowels or the bladder.  He has no underlying issues with depression or anxiety.  His wife does have cancer, however.  The patient has undergone blood work to include a B12 level and RPR that were unremarkable.  He is sent to this office for an evaluation.  Past Medical History:  Diagnosis Date  . Anginal pain (HCC)   . Arthritis    "hands, knees, back, rightt foot" (10/11/2016)  . Asthma   . Chronic bronchitis (HCC)    "get it most years" (10/11/2016)  . Chronic lower back pain   . Coronary artery disease   . High cholesterol   . Lumbar degenerative disc disease 12/14/2013  . Migraine    "daily to weekly" (10/11/2016)  . Pneumonia    "several times; last time was in 03/2016" (10/11/2016)  . Sleep apnea   .  Stress fracture of foot 12/14/2013   "right"    Past Surgical History:  Procedure Laterality Date  . BILATERAL CARPAL TUNNEL RELEASE Bilateral   . CORONARY ANGIOPLASTY WITH STENT PLACEMENT  10/11/2016  . CORONARY STENT INTERVENTION N/A 10/11/2016   Procedure: CORONARY STENT INTERVENTION;  Surgeon: Runell GessBerry, Jonathan J, MD;  Location: [facility] IN[facility]IVE [facility];  Service: Cardiovascular;  Laterality: N/A;  prox RCA  . KNEE ARTHROSCOPY Left 1978  . KNEE CARTILAGE SURGERY Left   . LEFT HEART CATH AND CORONARY ANGIOGRAPHY N/A 10/11/2016   Procedure: LEFT HEART CATH AND CORONARY ANGIOGRAPHY;  Surgeon: Runell GessBerry, Jonathan J, MD;  Location: [facility] CV [facility];  Service: Cardiovascular;  Laterality: N/A;    Family History  Problem Relation Age of Onset  . Brain cancer Father   . Alcoholism Mother   . Diabetes Mother        father   . Lung cancer Mother   . Cancer Unknown   . Cancer - Prostate Brother     Social history:  reports that he quit smoking about 3 months ago. His smoking use included cigarettes. He has a 14.52 pack-year smoking history. he has never used smokeless tobacco. He reports that he drinks about 3.6 oz of alcohol per week. He reports that he does not use drugs.  Medications:  Prior to Admission medications   Medication Sig Start Date End Date Taking? Authorizing Provider  acetaminophen (TYLENOL) 325 MG tablet Take 650 mg by mouth every 6 (six) hours as needed.   Yes [provider]  albuterol (PROVENTIL HFA;VENTOLIN HFA) 108 (90 Base) MCG/ACT inhaler Inhale 2 puffs into the lungs every 6 (six) hours as needed for wheezing or shortness of breath. 10/18/16 10/18/17 Yes Monica Becton, MD  albuterol (PROVENTIL) (2.5 MG/3ML) 0.083% nebulizer solution Take 3 mLs (2.5 mg total) by nebulization every 6 (six) hours as needed for wheezing or shortness of breath. 04/24/16  Yes Monica Becton, MD  aspirin EC 81 MG tablet Take 1 tablet (81 mg total) by mouth daily. Patient  taking differently: Take 81 mg by mouth every evening.  07/06/15  Yes Hommel, Sean, DO  atorvastatin (LIPITOR) 80 MG tablet Take 1 tablet (80 mg total) by mouth at bedtime. 11/27/16  Yes Monica Becton, MD  fluticasone (FLONASE) 50 MCG/ACT nasal spray One spray in each nostril twice a day, use left hand for right nostril, and right hand for left nostril. 10/22/16  Yes Monica Becton, MD  metoprolol succinate (TOPROL XL) 25 MG 24 hr tablet Take 1 tablet (25 mg total) by mouth daily. 09/27/16  Yes Hilty, Lisette Abu, MD  montelukast (SINGULAIR) 10 MG tablet Take 1 tablet (10 mg total) by mouth at bedtime. 03/08/16  Yes Monica Becton, MD  nitroGLYCERIN (NITROSTAT) 0.4 MG SL tablet DISSOLVE 1 TAB UNDER TONGUE FOR CHEST PAIN - IF PAIN REMAINS AFTER 5 MIN, CALL 911 AND REPEAT DOSE. MAX 3 TABS IN 15 MINUTES 12/31/16  Yes Monica Becton, MD  ticagrelor (BRILINTA) 90 MG TABS tablet Take 1 tablet (90 mg total) by mouth 2 (two) times daily. 10/12/16  Yes Duke, Roe Rutherford, PA  traMADol (ULTRAM) 50 MG tablet Take 1 tablet (50 mg total) by mouth 2 (two) times daily as needed. 12/19/16  Yes Monica Becton, MD      Allergies  Allergen Reactions  . Azithromycin Other (See Comments)    Tingling/swelling in lips/tongue  . E-Mycin [Erythromycin] Nausea Only    GI upset     ROS:  Out of a complete 14 system review of symptoms, the patient complains only of the following symptoms, and all other reviewed systems are negative.  Weight gain, fatigue Shortness of breath Joint pain, aching muscles Memory loss, confusion Anxiety Sleepiness  Blood pressure 138/81, pulse 68, height 5\' 11"  (1.803 m), weight 206 lb 8 oz (93.7 kg).  Physical Exam  General: The patient is alert and cooperative at the time of the examination.  Eyes: Pupils are equal, round, and reactive to light. Discs are flat bilaterally.  Neck: The neck is supple, no carotid bruits are noted.  Respiratory:  The respiratory examination is clear.  Cardiovascular: The cardiovascular examination reveals a regular rate and rhythm, no obvious murmurs or rubs are noted.  Skin: Extremities are without significant edema.  Neurologic Exam  Mental status: The patient is alert and oriented x 3 at the time of the examination. The patient has apparent normal recent and remote memory, with an apparently normal attention span and concentration ability.  Mini-Mental status examination done today shows a total score of 30/30.  Cranial nerves: Facial symmetry is present. There is good sensation of the face to pinprick and soft touch bilaterally. The strength of the facial muscles and the muscles to head turning and shoulder shrug are normal bilaterally. Speech  is well enunciated, no aphasia or dysarthria is noted. Extraocular movements are full. Visual fields are full. The tongue is midline, and the patient has symmetric elevation of the soft palate. No obvious hearing deficits are noted.  Motor: The motor testing reveals 5 over 5 strength of all 4 extremities. Good symmetric motor tone is noted throughout.  Sensory: Sensory testing is intact to pinprick, soft touch, vibration sensation, and position sense on all 4 extremities. No evidence of extinction is noted.  Coordination: Cerebellar testing reveals good finger-nose-finger and heel-to-shin bilaterally.  Gait and station: Gait is normal. Tandem gait is normal. Romberg is negative. No drift is seen.  Reflexes: Deep tendon reflexes are symmetric, but are depressed bilaterally. Toes are downgoing bilaterally.   Assessment/Plan:  1.  Mild cognitive impairment  The patient has noted a mild change in memory over the last 2 years.  He is mainly concerned about his word finding problems and ability to remember names.  The patient will be set up for MRI of the brain, he does have risk factors for cerebrovascular disease.  The patient may decide to go on low-dose  Aricept in the near future.  He will be followed over time for the memory, he will return in 6 months.  Marlan Palau. Keith Taelyn Broecker MD 01/03/2017 1:43 PM  [facility] [address] [place] [id number]  Phone [phone] Fax [phone]

## 2017-01-03 NOTE — Patient Instructions (Signed)
   We will get MRI of the brain. 

## 2017-01-04 ENCOUNTER — Encounter (HOSPITAL_COMMUNITY): Payer: BC Managed Care – PPO

## 2017-01-07 ENCOUNTER — Encounter (HOSPITAL_COMMUNITY): Payer: BC Managed Care – PPO

## 2017-01-09 ENCOUNTER — Encounter (HOSPITAL_COMMUNITY): Payer: BC Managed Care – PPO

## 2017-01-09 ENCOUNTER — Ambulatory Visit (INDEPENDENT_AMBULATORY_CARE_PROVIDER_SITE_OTHER): Payer: BC Managed Care – PPO

## 2017-01-09 DIAGNOSIS — R413 Other amnesia: Secondary | ICD-10-CM | POA: Diagnosis not present

## 2017-01-10 ENCOUNTER — Telehealth: Payer: Self-pay | Admitting: Neurology

## 2017-01-10 NOTE — Telephone Encounter (Signed)
  I called the patient.  MRI of the brain was completely normal.  If he does decide to go on a medication for memory, he is to contact our office.  MRI brain 01/10/17:  IMPRESSION:  Normal MRI brain (without).

## 2017-01-11 ENCOUNTER — Encounter (HOSPITAL_COMMUNITY)
Admission: RE | Admit: 2017-01-11 | Discharge: 2017-01-11 | Disposition: A | Discharge status: Home / Self Care | Payer: BC Managed Care – PPO | Source: Ambulatory Visit | Attending: Internal Medicine | Admitting: Internal Medicine

## 2017-01-11 DIAGNOSIS — Z955 Presence of coronary angioplasty implant and graft: Secondary | ICD-10-CM | POA: Diagnosis not present

## 2017-01-11 NOTE — Progress Notes (Signed)
Joseph RocherChristopher Jacobs 61 y.o. male DOB 09/19/1955 MRN [id number]       Nutrition  1. Status post coronary artery stent placement 10/12/16 DES RCA    Lab Results  Component Value Date   CHOL 130 12/28/2016   HDL 63 12/28/2016   LDLCALC 104 07/06/2015   TRIG 72 12/28/2016   CHOLHDL 2.1 12/28/2016   CMP Latest Ref Rng & Units 12/28/2016 10/12/2016 10/10/2016  Glucose 65 - 99 mg/dL 130(Q173(H) 657(Q113(H) 469(G158(H)  BUN 7 - 25 mg/dL 15 10 10   Creatinine 0.70 - 1.25 mg/dL 2.950.79 2.840.84 1.320.85  Sodium 135 - 146 mmol/L 140 138 139  Potassium 3.5 - 5.3 mmol/L 4.2 3.6 3.6  Chloride 98 - 110 mmol/L 106 108 108  CO2 20 - 32 mmol/L 23 24 22   Calcium 8.6 - 10.3 mg/dL 9.3 4.4(W8.6(L) 9.3  Total Protein 6.1 - 8.1 g/dL 6.2 - -  Total Bilirubin 0.2 - 1.2 mg/dL 0.4 - -  Alkaline Phos 40 - 115 U/L - - -  AST 10 - 35 U/L 19 - -  ALT 9 - 46 U/L 28 - -   Note Spoke with pt. Pt concerned his fasting CBG was 173 on his last labs. Pt has a family h/o DM and pt reports a history of pre-diabetes. Pt is pre-diabetic according to his last A1c. Pt states his statin was increased after his stent. Pre-diabetes discussed. Pt expressed understanding of the information reviewed. Pt aware of nutrition education classes offered.  Nutrition Diagnosis ? Food-and nutrition-related knowledge deficit related to lack of exposure to information as related to diagnosis of: ? CVD ? Pre-diabetes ? Overweight related to excessive energy intake as evidenced by a BMI of 28.3  Nutrition Intervention ? Pt encouraged to discuss further DM testing with his PCP. ? Pt's individual nutrition plan reviewed with pt.  Nutrition Goal(s):  ? Pt to identify and limit food sources of saturated fat, trans fat, and sodium ? Pt to identify food quantities necessary to achieve weight loss of 6-24 lb (2.7-10.9 kg) at graduation from cardiac rehab.   Plan:  Pt to attend nutrition classes ? Nutrition I ? Nutrition II ? Portion Distortion  Will provide client-centered  nutrition education as part of interdisciplinary care.   Monitor and evaluate progress toward nutrition goal with team.  Mickle PlumbEdna Lanijah Warzecha, M.Ed, RD, LDN, CDE 01/11/2017 3:24 PM

## 2017-01-14 ENCOUNTER — Encounter (HOSPITAL_COMMUNITY): Payer: BC Managed Care – PPO

## 2017-01-16 ENCOUNTER — Encounter (HOSPITAL_COMMUNITY): Payer: BC Managed Care – PPO

## 2017-01-18 ENCOUNTER — Encounter (HOSPITAL_COMMUNITY)
Admission: RE | Admit: 2017-01-18 | Discharge: 2017-01-18 | Disposition: A | Discharge status: Home / Self Care | Payer: BC Managed Care – PPO | Source: Ambulatory Visit | Attending: Internal Medicine | Admitting: Internal Medicine

## 2017-01-18 DIAGNOSIS — Z955 Presence of coronary angioplasty implant and graft: Secondary | ICD-10-CM | POA: Diagnosis not present

## 2017-01-21 ENCOUNTER — Encounter (HOSPITAL_COMMUNITY): Payer: BC Managed Care – PPO

## 2017-01-21 DIAGNOSIS — I200 Unstable angina: Secondary | ICD-10-CM | POA: Insufficient documentation

## 2017-01-21 DIAGNOSIS — J189 Pneumonia, unspecified organism: Secondary | ICD-10-CM | POA: Insufficient documentation

## 2017-01-21 DIAGNOSIS — I2510 Atherosclerotic heart disease of native coronary artery without angina pectoris: Secondary | ICD-10-CM | POA: Insufficient documentation

## 2017-01-21 DIAGNOSIS — G8929 Other chronic pain: Secondary | ICD-10-CM | POA: Insufficient documentation

## 2017-01-21 DIAGNOSIS — M1990 Unspecified osteoarthritis, unspecified site: Secondary | ICD-10-CM | POA: Insufficient documentation

## 2017-01-21 DIAGNOSIS — G4730 Sleep apnea, unspecified: Secondary | ICD-10-CM | POA: Insufficient documentation

## 2017-01-21 DIAGNOSIS — M545 Low back pain, unspecified: Secondary | ICD-10-CM | POA: Insufficient documentation

## 2017-01-21 DIAGNOSIS — J42 Unspecified chronic bronchitis: Secondary | ICD-10-CM | POA: Insufficient documentation

## 2017-01-21 DIAGNOSIS — E7800 Pure hypercholesterolemia, unspecified: Secondary | ICD-10-CM | POA: Insufficient documentation

## 2017-01-21 DIAGNOSIS — J45909 Unspecified asthma, uncomplicated: Secondary | ICD-10-CM | POA: Insufficient documentation

## 2017-01-23 ENCOUNTER — Encounter (HOSPITAL_COMMUNITY): Payer: BC Managed Care – PPO

## 2017-01-25 ENCOUNTER — Telehealth (HOSPITAL_COMMUNITY): Payer: Self-pay | Admitting: *Deleted

## 2017-01-25 ENCOUNTER — Encounter (HOSPITAL_COMMUNITY): Payer: BC Managed Care – PPO

## 2017-01-25 ENCOUNTER — Encounter (HOSPITAL_COMMUNITY): Payer: Self-pay | Admitting: *Deleted

## 2017-01-25 NOTE — Progress Notes (Signed)
Cardiac Individual Treatment Plan  Patient Details  Name: Joseph RocherChristopher Kessen MRN: [id number] Date of Birth: 04/27/1955 Referring Provider:     CARDIAC REHAB PHASE II [facility]  Referring Provider  Dr Italyhad Hilty      Initial Encounter Date:    CARDIAC REHAB PHASE II ORIENTATION [facility] 11[facility]01/2016 [facility]  Date  11/01/16  Referring Provider  Dr Italyhad Hilty      Visit Diagnosis: No diagnosis found.  Patient's Home Medications on Admission:  Current Outpatient Medications:  .  acetaminophen (TYLENOL) 325 MG tablet, Take 650 mg by mouth every 6 (six) hours as needed., Disp: , Rfl:  .  albuterol (PROVENTIL HFA;VENTOLIN HFA) 108 (90 Base) MCG/ACT inhaler, Inhale 2 puffs into the lungs every 6 (six) hours as needed for wheezing or shortness of breath., Disp: 1 Inhaler, Rfl: 3 .  albuterol (PROVENTIL) (2.5 MG/3ML) 0.083% nebulizer solution, Take 3 mLs (2.5 mg total) by nebulization every 6 (six) hours as needed for wheezing or shortness of breath., Disp: 75 mL, Rfl: 12 .  aspirin EC 81 MG tablet, Take 1 tablet (81 mg total) by mouth daily. (Patient taking differently: Take 81 mg by mouth every evening. ), Disp: , Rfl:  .  atorvastatin (LIPITOR) 80 MG tablet, Take 1 tablet (80 mg total) by mouth at bedtime., Disp: 90 tablet, Rfl: 3 .  fluticasone (FLONASE) 50 MCG/ACT nasal spray, One spray in each nostril twice a day, use left hand for right nostril, and right hand for left nostril., Disp: 48 g, Rfl: 3 .  metoprolol succinate (TOPROL XL) 25 MG 24 hr tablet, Take 1 tablet (25 mg total) by mouth daily., Disp: 90 tablet, Rfl: 3 .  montelukast (SINGULAIR) 10 MG tablet, Take 1 tablet (10 mg total) by mouth at bedtime., Disp: 90 tablet, Rfl: 3 .  nitroGLYCERIN (NITROSTAT) 0.4 MG SL tablet, DISSOLVE 1 TAB UNDER TONGUE FOR CHEST PAIN - IF PAIN REMAINS AFTER 5 MIN, CALL 911 AND REPEAT DOSE. MAX 3 TABS IN 15  MINUTES, Disp: 25 tablet, Rfl: 0 .  ticagrelor (BRILINTA) 90 MG TABS tablet, Take 1 tablet (90 mg total) by mouth 2 (two) times daily., Disp: 60 tablet, Rfl: 11 .  traMADol (ULTRAM) 50 MG tablet, Take 1 tablet (50 mg total) by mouth 2 (two) times daily as needed., Disp: 60 tablet, Rfl: 2  Past Medical History: Past Medical History:  Diagnosis Date  . Anginal pain (HCC)   . Arthritis    "hands, knees, back, rightt foot" (10/11/2016)  . Asthma   . Chronic bronchitis (HCC)    "get it most years" (10/11/2016)  . Chronic lower back pain   . Coronary artery disease   . High cholesterol   . Lumbar degenerative disc disease 12/14/2013  . Migraine    "daily to weekly" (10/11/2016)  . Pneumonia    "several times; last time was in 03/2016" (10/11/2016)  . Sleep apnea   . Stress fracture of foot 12/14/2013   "right"    Tobacco Use: Social History   Tobacco Use  Smoking Status Former Smoker  . Packs/day: 0.33  . Years: 44.00  . Pack years: 14.52  . Types: Cigarettes  . Last attempt to quit: 09/29/2016  . Years since quitting: 0.3  Smokeless Tobacco Never Used    Labs: Recent Review Advice workerlowsheet Data    Labs for ITP Cardiac and Pulmonary Rehab Latest Ref Rng & Units 07/06/2015 09/26/2016  12/28/2016   Cholestrol <200 mg/dL 161 096 045   LDLCALC <409 mg/dL 811 - -   HDL >91 mg/dL 60 47 63   Trlycerides <150 mg/dL 478(G) 956(O) 72   Hemoglobin A1c <5.7 % - 5.9(H) -      Capillary Blood Glucose: Lab Results  Component Value Date   GLUCAP 85 10/11/2016     Exercise Target Goals:    Exercise Program Goal: Individual exercise prescription set with THRR, safety & activity barriers. Participant demonstrates ability to understand and report RPE using BORG scale, to self-measure pulse accurately, and to acknowledge the importance of the exercise prescription.  Exercise Prescription Goal: Starting with aerobic activity 30 plus minutes a day, 3 days per week for initial exercise  prescription. Provide home exercise prescription and guidelines that participant acknowledges understanding prior to discharge.  Activity Barriers & Risk Stratification:   6 Minute Walk:   Oxygen Initial Assessment:   Oxygen Re-Evaluation:   Oxygen Discharge (Final Oxygen Re-Evaluation):   Initial Exercise Prescription:   Perform Capillary Blood Glucose checks as needed.  Exercise Prescription Changes: Exercise Prescription Changes    Row Name 11/26/16 1600 12/12/16 1600 12/26/16 1503 01/11/17 1600 01/18/17 1449     Response to Exercise   Blood Pressure (Admit)  110/70  112/82  128/76  124/80  102/78   Blood Pressure (Exercise)  132/70  148/82  134/80  136/80  142/82   Blood Pressure (Exit)  100/60  104/60  108/64  126/82  122/68   Heart Rate (Admit)  86 bpm  85 bpm  79 bpm  70 bpm  79 bpm   Heart Rate (Exercise)  102 bpm  115 bpm  99 bpm  103 bpm  113 bpm   Heart Rate (Exit)  85 bpm  83 bpm  72 bpm  69 bpm  82 bpm   Rating of Perceived Exertion (Exercise)  13  13  13  13  14    Symptoms  none  none  none  none  none   Duration  Continue with 30 min of aerobic exercise without signs/symptoms of physical distress.  Continue with 30 min of aerobic exercise without signs/symptoms of physical distress.  Continue with 30 min of aerobic exercise without signs/symptoms of physical distress.  Continue with 30 min of aerobic exercise without signs/symptoms of physical distress.  Continue with 30 min of aerobic exercise without signs/symptoms of physical distress.   Intensity  THRR unchanged  THRR unchanged  THRR unchanged  THRR unchanged  THRR unchanged     Progression   Progression  Continue to progress workloads to maintain intensity without signs/symptoms of physical distress.  Continue to progress workloads to maintain intensity without signs/symptoms of physical distress.  Continue to progress workloads to maintain intensity without signs/symptoms of physical distress.  Continue to  progress workloads to maintain intensity without signs/symptoms of physical distress.  Continue to progress workloads to maintain intensity without signs/symptoms of physical distress.   Average METs  2.8  3.7  4.3  4.1  4.2     Resistance Training   Training Prescription  Yes  Yes  No Relaxation today  Yes  Yes   Weight  4lbs  5lbs  -  5lbs  5lbs   Reps  10-15  10-15  -  10-15  10-15   Time  10 Minutes  10 Minutes  -  10 Minutes  10 Minutes     Interval Training   Interval Training  No  No  No  No  No     Bike   Level  1.2  1.2  4  4   4.5   Minutes  10  10  10  10  10    METs  3.5  3.5  4.8  5.7  5.3     NuStep   Level  4  5  5  5  6    SPM  80  80  80  80  80   Minutes  10  10  20  10  10    METs  2.7  5.1  3.7  4  4.5     Arm Ergometer   Level  2  2.5  -  3  3   Minutes  10  10  -  10  10   METs  2.2  2.5  -  2.7  2.7     Home Exercise Plan   Plans to continue exercise at  Home (comment)  Home (comment)  Home (comment)  Home (comment)  Home (comment)   Frequency  Add 2 additional days to program exercise sessions.  Add 2 additional days to program exercise sessions.  Add 2 additional days to program exercise sessions.  Add 2 additional days to program exercise sessions.  Add 2 additional days to program exercise sessions.   Initial Home Exercises Provided  11/26/16  11/26/16  11/26/16  11/26/16  11/26/16      Exercise Comments: Exercise Comments    Row Name 11/26/16 1500 12/26/16 1517 01/11/17 1518       Exercise Comments  Reviewed home exercise guidelines with patient.  Reviewed METs and goals with patient.  Reviewed METs and goals with patient.        Exercise Goals and Review:   Exercise Goals Re-Evaluation : Exercise Goals Re-Evaluation    Row Name 11/26/16 1500 12/26/16 1517 01/11/17 1518         Exercise Goal Re-Evaluation   Exercise Goals Review  Understanding of Exercise Prescription;Knowledge and understanding of Target Heart Rate Range (THRR)  Increase  Physical Activity  Increase Physical Activity;Understanding of Exercise Prescription     Comments  Reviewed home exercise guidelines with patient including THRR, RPE scale, and endpoints for exercise. Patient is walking about 10 minutes and is limited by stress fracture in right foot. Pt is wearing boot and is looking into getting a recumbent bike for home use.  Patient is doing well with exercise. Still wearing boot and additionally injured hand so no arm ergometer. Moved from schwinn airdyne to scifit upright bike.  Patient is progressing well in the CR program. Patient is also walking 20 minutes daily and using 6-7lb free weights 25-30 minutes exery other day.     Expected Outcomes  Walk as tolerated, 1-2 days/week.  Increase workloads as tolerated.  Continue daily exercise activity walking, in addition to exercise at CR. Increase walking duration from 20 minutes to 30 minutes daily         Discharge Exercise Prescription (Final Exercise Prescription Changes): Exercise Prescription Changes - 01/18/17 1449      Response to Exercise   Blood Pressure (Admit)  102/78    Blood Pressure (Exercise)  142/82    Blood Pressure (Exit)  122/68    Heart Rate (Admit)  79 bpm    Heart Rate (Exercise)  113 bpm    Heart Rate (Exit)  82 bpm    Rating of Perceived Exertion (Exercise)  14  Symptoms  none    Duration  Continue with 30 min of aerobic exercise without signs/symptoms of physical distress.    Intensity  THRR unchanged      Progression   Progression  Continue to progress workloads to maintain intensity without signs/symptoms of physical distress.    Average METs  4.2      Resistance Training   Training Prescription  Yes    Weight  5lbs    Reps  10-15    Time  10 Minutes      Interval Training   Interval Training  No      Bike   Level  4.5    Minutes  10    METs  5.3      NuStep   Level  6    SPM  80    Minutes  10    METs  4.5      Arm Ergometer   Level  3    Minutes  10     METs  2.7      Home Exercise Plan   Plans to continue exercise at  Home (comment)    Frequency  Add 2 additional days to program exercise sessions.    Initial Home Exercises Provided  11/26/16       Nutrition:  Target Goals: Understanding of nutrition guidelines, daily intake of sodium 1500mg , cholesterol 200mg , calories 30% from fat and 7% or less from saturated fats, daily to have 5 or more servings of fruits and vegetables.  Biometrics:    Nutrition Therapy Plan and Nutrition Goals:   Nutrition Discharge: Nutrition Scores:   Nutrition Goals Re-Evaluation:   Nutrition Goals Re-Evaluation:   Nutrition Goals Discharge (Final Nutrition Goals Re-Evaluation):   Psychosocial: Target Goals: Acknowledge presence or absence of significant depression and/or stress, maximize coping skills, provide positive support system. Participant is able to verbalize types and ability to use techniques and skills needed for reducing stress and depression.  Initial Review & Psychosocial Screening:   Quality of Life Scores:   PHQ-9: Recent Review Flowsheet Data    Depression screen [facility] 2/9 10/16/2016 09/03/2016   Decreased Interest 1 0   Down, Depressed, Hopeless 1 0   PHQ - 2 Score 2 0   Altered sleeping 0 -   Tired, decreased energy 2 -   Change in appetite 0 -   Feeling bad or failure about yourself  0 -   Trouble concentrating 2 -   Moving slowly or fidgety/restless 2 -   Suicidal thoughts 0 -   PHQ-9 Score 8 -     Interpretation of Total Score  Total Score Depression Severity:  1-4 = Minimal depression, 5-9 = Mild depression, 10-14 = Moderate depression, 15-19 = Moderately severe depression, 20-27 = Severe depression   Psychosocial Evaluation and Intervention:   Psychosocial Re-Evaluation: Psychosocial Re-Evaluation    Row Name 11/29/16 1536 11/29/16 1537 12/27/16 1805 01/25/17 1643       Psychosocial Re-Evaluation   Current issues with  None Identified  None  Identified  None Identified  None Identified    Interventions  Encouraged to attend Cardiac Rehabilitation for the exercise  Encouraged to attend Cardiac Rehabilitation for the exercise  Encouraged to attend Cardiac Rehabilitation for the exercise  Encouraged to attend Cardiac Rehabilitation for the exercise    Continue Psychosocial Services   -  No Follow up required  No Follow up required  No Follow up required       Psychosocial  Discharge (Final Psychosocial Re-Evaluation): Psychosocial Re-Evaluation - 01/25/17 1643      Psychosocial Re-Evaluation   Current issues with  None Identified    Interventions  Encouraged to attend Cardiac Rehabilitation for the exercise    Continue Psychosocial Services   No Follow up required       Vocational Rehabilitation: Provide vocational rehab assistance to qualifying candidates.   Vocational Rehab Evaluation & Intervention:   Education: Education Goals: Education classes will be provided on a weekly basis, covering required topics. Participant will state understanding/return demonstration of topics presented.  Learning Barriers/Preferences:   Education Topics: Count Your Pulse:  -Group instruction provided by verbal instruction, demonstration, patient participation and written materials to support subject.  Instructors address importance of being able to find your pulse and how to count your pulse when at home without a heart monitor.  Patients get hands on experience counting their pulse with staff help and individually.   CARDIAC REHAB PHASE II EXERCISE from 01/11/2017 in [facility] CARDIAC REHAB  Date  11/30/16  Instruction Review Code  2- meets goals/outcomes      Heart Attack, Angina, and Risk Factor Modification:  -Group instruction provided by verbal instruction, video, and written materials to support subject.  Instructors address signs and symptoms of angina and heart attacks.    Also discuss risk factors for heart  disease and how to make changes to improve heart health risk factors.   Functional Fitness:  -Group instruction provided by verbal instruction, demonstration, patient participation, and written materials to support subject.  Instructors address safety measures for doing things around the house.  Discuss how to get up and down off the floor, how to pick things up properly, how to safely get out of a chair without assistance, and balance training.   Meditation and Mindfulness:  -Group instruction provided by verbal instruction, patient participation, and written materials to support subject.  Instructor addresses importance of mindfulness and meditation practice to help reduce stress and improve awareness.  Instructor also leads participants through a meditation exercise.    Stretching for Flexibility and Mobility:  -Group instruction provided by verbal instruction, patient participation, and written materials to support subject.  Instructors lead participants through series of stretches that are designed to increase flexibility thus improving mobility.  These stretches are additional exercise for major muscle groups that are typically performed during regular warm up and cool down.   CARDIAC REHAB PHASE II EXERCISE from 01/11/2017 in [facility] CARDIAC REHAB  Date  11/21/16  Educator  EP  Instruction Review Code  2- meets goals/outcomes      Hands Only CPR:  -Group verbal, video, and participation provides a basic overview of AHA guidelines for community CPR. Role-play of emergencies allow participants the opportunity to practice calling for help and chest compression technique with discussion of AED use.   Hypertension: -Group verbal and written instruction that provides a basic overview of hypertension including the most recent diagnostic guidelines, risk factor reduction with self-care instructions and medication management.    Nutrition I class: Heart Healthy Eating:   -Group instruction provided by PowerPoint slides, verbal discussion, and written materials to support subject matter. The instructor gives an explanation and review of the Therapeutic Lifestyle Changes diet recommendations, which includes a discussion on lipid goals, dietary fat, sodium, fiber, plant stanol/sterol esters, sugar, and the components of a well-balanced, healthy diet.   CARDIAC REHAB PHASE II EXERCISE from 01/11/2017 in [facility] CARDIAC [facility]  Date  01/02/17 Debbrah Alar[class handouts given]  Educator  RD  Instruction Review Code  Not applicable      Nutrition II class: Lifestyle Skills:  -Group instruction provided by PowerPoint slides, verbal discussion, and written materials to support subject matter. The instructor gives an explanation and review of label reading, grocery shopping for heart health, heart healthy recipe modifications, and ways to make healthier choices when eating out.   [facility]ARD[facility]AC [facility]  Date  01/02/17 Debbrah Alar[class handouts given]  Educator  RD  Instruction Review Code  Not applicable      Diabetes Question & Answer:  -Group instruction provided by PowerPoint slides, verbal discussion, and written materials to support subject matter. The instructor gives an explanation and review of diabetes co-morbidities, pre- and post-prandial blood glucose goals, pre-exercise blood glucose goals, signs, symptoms, and treatment of hypoglycemia and hyperglycemia, and foot care basics.   [facility]ARDIAC REHAB [facility]  Date  01/11/17  Educator  RD  Instruction Review Code  2- meets goals/outcomes      Diabetes Blitz:  -Group instruction provided by PowerPoint slides, verbal discussion, and written materials to support subject matter. The instructor gives an explanation and review of the physiology behind type 1 and type 2  diabetes, diabetes medications and rational behind using different medications, pre- and post-prandial blood glucose recommendations and Hemoglobin A1c goals, diabetes diet, and exercise including blood glucose guidelines for exercising safely.    Portion Distortion:  -Group instruction provided by PowerPoint slides, verbal discussion, written materials, and food models to support subject matter. The instructor gives an explanation of serving size versus portion size, changes in portions sizes over the last 20 years, and what consists of a serving from each food group.   Stress Management:  -Group instruction provided by verbal instruction, video, and written materials to support subject matter.  Instructors review role of stress in heart disease and how to cope with stress positively.     CARDIAC REHAB PHASE [facility]  Date  11/23/16  Instruction Review Code  2- meets goals/outcomes      Exercising on Your Own:  -Group instruction provided by verbal instruction, power point, and written materials to support subject.  Instructors discuss benefits of exercise, components of exercise, frequency and intensity of exercise, and end points for exercise.  Also discuss use of nitroglycerin and activating EMS.  Review options of places to exercise outside of rehab.  Review guidelines for sex with heart disease.   Cardiac Drugs I:  -Group instruction provided by verbal instruction and written materials to support subject.  Instructor reviews cardiac drug classes: antiplatelets, anticoagulants, beta blockers, and statins.  Instructor discusses reasons, side effects, and lifestyle considerations for each drug class.   Cardiac Drugs II:  -Group instruction provided by verbal instruction and written materials to support subject.  Instructor reviews cardiac drug classes: angiotensin converting enzyme inhibitors (ACE-I), angiotensin II receptor  blockers (ARBs), nitrates, and calcium channel blockers.  Instructor discusses reasons, side effects, and lifestyle considerations for each drug class.   Anatomy and Physiology of the Circulatory System:  Group verbal and written instruction and models provide basic cardiac anatomy and physiology, with the coronary electrical and arterial systems. Review of: AMI, Angina, Valve disease, Heart Failure, Peripheral Artery Disease, Cardiac Arrhythmia, Pacemakers, and the ICD.   CARDIAC REHAB PHASE  II EXERCISE from 01/11/2017 in [facility] CARDIAC REHAB  Date  12/26/16  Instruction Review Code  2- meets goals/outcomes      Other Education:  -Group or individual verbal, written, or video instructions that support the educational goals of the cardiac rehab program.   Knowledge Questionnaire Score:   Core Components/Risk Factors/Patient Goals at Admission:   Core Components/Risk Factors/Patient Goals Review:  Goals and Risk Factor Review    Row Name 11/29/16 1524 12/27/16 1805 01/25/17 1643         Core Components/Risk Factors/Patient Goals Review   Personal Goals Review  Weight Management/Obesity;Lipids;Tobacco Cessation  Weight Management/Obesity;Lipids;Tobacco Cessation  Weight Management/Obesity;Lipids;Tobacco Cessation     Review  Thayer Ohm is doing well with exercise. Vital signs have been stable.  Thayer Ohm is doing well with exercise. Vital signs have been stable.  Thayer Ohm is doing well with exercise. Vital signs have been stable.     Expected Outcomes  Thayer Ohm will continue to come to cardiac rehab for exericse and take his medicaitons as presribed. Will continue to encourage smoking cessation.  Thayer Ohm will continue to come to cardiac rehab for exericse and take his medicaitons as presribed. Will continue to encourage smoking cessation.  Thayer Ohm will continue to come to cardiac rehab for exericse and take his medicaitons as presribed. Will continue to encourage smoking cessation.         Core Components/Risk Factors/Patient Goals at Discharge (Final Review):  Goals and Risk Factor Review - 01/25/17 1643      Core Components/Risk Factors/Patient Goals Review   Personal Goals Review  Weight Management/Obesity;Lipids;Tobacco Cessation    Review  Thayer Ohm is doing well with exercise. Vital signs have been stable.    Expected Outcomes  Thayer Ohm will continue to come to cardiac rehab for exericse and take his medicaitons as presribed. Will continue to encourage smoking cessation.       ITP Comments: ITP Comments    Row Name 12/27/16 1802 12/27/16 1804 01/25/17 1642       ITP Comments  30 Day ITP Review  30 Day ITP Review, Thayer Ohm has had good participation in phase 2 cardiac rehab when he is able to attend. Thayer Ohm has been out due to a recent R hand hematoma  30 Day ITP Review, Thayer Ohm has had good participation in phase 2 cardiac rehab when he is able to attend.         Comments: See ITP comments.Gladstone Lighter, RN,BSN 01/25/2017 4:45 PM

## 2017-01-28 ENCOUNTER — Encounter (HOSPITAL_COMMUNITY)
Admission: RE | Admit: 2017-01-28 | Discharge: 2017-01-28 | Disposition: A | Discharge status: Home / Self Care | Payer: BC Managed Care – PPO | Source: Ambulatory Visit | Attending: Internal Medicine | Admitting: Internal Medicine

## 2017-01-28 ENCOUNTER — Ambulatory Visit: Payer: BC Managed Care – PPO | Admitting: Internal Medicine

## 2017-01-28 ENCOUNTER — Encounter: Payer: Self-pay | Admitting: Internal Medicine

## 2017-01-28 ENCOUNTER — Ambulatory Visit (INDEPENDENT_AMBULATORY_CARE_PROVIDER_SITE_OTHER): Payer: BC Managed Care – PPO | Admitting: Internal Medicine

## 2017-01-28 VITALS — BP 106/72 | HR 74 | Ht 71.0 in | Wt 204.0 lb

## 2017-01-28 VITALS — BP 112/62 | HR 89 | Ht 69.75 in | Wt 201.9 lb

## 2017-01-28 DIAGNOSIS — I200 Unstable angina: Secondary | ICD-10-CM

## 2017-01-28 DIAGNOSIS — E785 Hyperlipidemia, unspecified: Secondary | ICD-10-CM

## 2017-01-28 DIAGNOSIS — Z955 Presence of coronary angioplasty implant and graft: Secondary | ICD-10-CM | POA: Diagnosis not present

## 2017-01-28 DIAGNOSIS — I2510 Atherosclerotic heart disease of native coronary artery without angina pectoris: Secondary | ICD-10-CM

## 2017-01-28 DIAGNOSIS — R002 Palpitations: Secondary | ICD-10-CM | POA: Diagnosis not present

## 2017-01-28 NOTE — Patient Instructions (Signed)
Medication Instructions: Your physician recommends that you continue on your current medications as directed. Please refer to the Current Medication list given to you today.  If you need a refill on your cardiac medications before your next appointment, please call your pharmacy.    Follow-Up: Your physician wants you to follow-up in: 3 months with Dr. Rennis GoldenHilty. You will receive a reminder letter in the mail two months in advance. If you don't receive a letter, please call our office at [phone] to schedule this follow-up appointment.   [facility] you [facility] choosing [facility]!!

## 2017-01-28 NOTE — Progress Notes (Signed)
OFFICE CONSULT NOTE  Chief Complaint:  Chest pain  Primary Care Physician: Monica Bectonhekkekandam, Thomas J, MD  HPI:  Joseph Jacobs is a 61 y.o. male who is being seen today for the evaluation of chest pain at the request of Monica Bectonhekkekandam, Thomas J,*. Joseph Jacobs was referred for evaluation of chest and arm pain. He has a history of asthma and migraine headaches. 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 He is a quarter pack per day smoker 30 years and [place]. His only exercise is playing golf several times a week but he still manages to score in the 70s. Recently he's had some chest pressure. He says it comes on with or without exertion and is relieved by rest. Sometimes he has radiating symptoms down the left arm, associated with some numbness and tingling. There is a family history of diabetes but no coronary artery disease. Both parents had cancer. He denies any worsening shortness of breath with exertion. Recent labs indicate an LDL of 104. He also had a CT angiogram of the chest to rule out a pulmonary embolism in June 2017 after similar symptoms of chest pain. This demonstrated calcification in the LAD. He was seen by Joseph JewsKatie Thompson, PA-C, who ordered a Lexiscan Myoview on 07/19/2015 which showed normal LV function and no ischemia.  01/28/2017  Joseph Jacobs returns today for follow-up.  He was referred for left heart catheterization and coronary angiography and underwent PCI to the RCA which is 95% stenotic and had drug-eluting stent placement on 10/11/2016.  Subsequently he is done well without any recurrent anginal symptoms.  He had reported some palpitations about a month afterwards and underwent a monitor which showed occasional PACs and PVCs.  There is a brief nonsustained atrial run.  Medications were adjusted and he is done well.  Recently a lipid  profile was obtained showing total cholesterol 130, triglycerides 63, HDL 72, LDL 52.  He is participating cardiac rehab and denies any worsening shortness of breath or chest pain.  He still on dual antiplatelet therapy with aspirin and Brilinta.  He is on high-dose atorvastatin and metoprolol.  PMHx:  Past Medical History:  Diagnosis Date  . Anginal pain (HCC)   . Arthritis    "hands, knees, back, rightt foot" (10/11/2016)  . Asthma   . Chronic bronchitis (HCC)    "get it most years" (10/11/2016)  . Chronic lower back pain   . Coronary artery disease   . High cholesterol   . Lumbar degenerative disc disease 12/14/2013  . Migraine    "daily to weekly" (10/11/2016)  . Pneumonia    "several times; last time was in 03/2016" (10/11/2016)  . Sleep apnea   . Stress fracture of foot 12/14/2013   "right"    Past Surgical History:  Procedure Laterality Date  . BILATERAL CARPAL TUNNEL RELEASE Bilateral   . CORONARY ANGIOPLASTY WITH STENT PLACEMENT  10/11/2016  . CORONARY STENT INTERVENTION N/A 10/11/2016   Procedure: CORONARY STENT INTERVENTION;  Surgeon: Runell GessBerry, Jonathan J, MD;  Location: [facility];  Service: Cardiovascular;  Laterality: N/A;  prox RCA  . KNEE ARTHROSCOPY Left 1978  . KNEE CARTILAGE SURGERY Left   . LEFT HEART CATH AND CORONARY ANGIOGRAPHY N/A 10/11/2016   Procedure: LEFT HEART CATH AND CORONARY ANGIOGRAPHY;  Surgeon: Runell GessBerry, Jonathan J, MD;  Location: [facility];  Service: Cardiovascular;  Laterality: N/A;    FAMHx:  Family History  Problem Relation Age of Onset  . Brain cancer Father   . Alcoholism Mother   . Diabetes Mother        father   . Lung cancer Mother   . Cancer Unknown   . Cancer - Prostate Brother     SOCHx:   reports that he quit smoking about 3 months ago. His smoking use included cigarettes. He has a 14.52 pack-year smoking history. he has never used smokeless tobacco. He reports that he drinks about 3.6 oz of alcohol per week. He  reports that he does not use drugs.  ALLERGIES:  Allergies  Allergen Reactions  . Azithromycin Other (See Comments)    Tingling/swelling in lips/tongue  . E-Mycin [Erythromycin] Nausea Only    GI upset     ROS: Pertinent items noted in HPI and remainder of comprehensive ROS otherwise negative.  HOME MEDS: Current Outpatient Medications on File Prior to Visit  Medication Sig Dispense Refill  . acetaminophen (TYLENOL) 325 MG tablet Take 650 mg by mouth every 6 (six) hours as needed.    Marland Kitchen albuterol (PROVENTIL HFA;VENTOLIN HFA) 108 (90 Base) MCG/ACT inhaler Inhale 2 puffs into the lungs every 6 (six) hours as needed for wheezing or shortness of breath. 1 Inhaler 3  . albuterol (PROVENTIL) (2.5 MG/3ML) 0.083% nebulizer solution Take 3 mLs (2.5 mg total) by nebulization every 6 (six) hours as needed for wheezing or shortness of breath. 75 mL 12  . aspirin EC 81 MG tablet Take 1 tablet (81 mg total) by mouth daily. (Patient taking differently: Take 81 mg by mouth every evening. )    . atorvastatin (LIPITOR) 80 MG tablet Take 1 tablet (80 mg total) by mouth at bedtime. 90 tablet 3  . fluticasone (FLONASE) 50 MCG/ACT nasal spray One spray in each nostril twice a day, use left hand for right nostril, and right hand for left nostril. 48 g 3  . metoprolol succinate (TOPROL XL) 25 MG 24 hr tablet Take 1 tablet (25 mg total) by mouth daily. 90 tablet 3  . montelukast (SINGULAIR) 10 MG tablet Take 1 tablet (10 mg total) by mouth at bedtime. 90 tablet 3  . nitroGLYCERIN (NITROSTAT) 0.4 MG SL tablet DISSOLVE 1 TAB UNDER TONGUE FOR CHEST PAIN - IF PAIN REMAINS AFTER 5 MIN, CALL 911 AND REPEAT DOSE. MAX 3 TABS IN 15 MINUTES 25 tablet 0  . ticagrelor (BRILINTA) 90 MG TABS tablet Take 1 tablet (90 mg total) by mouth 2 (two) times daily. 60 tablet 11  . traMADol (ULTRAM) 50 MG tablet Take 1 tablet (50 mg total) by mouth 2 (two) times daily as needed. 60 tablet 2   No current facility-administered medications  on file prior to visit.     LABS/IMAGING: No results found for this or any previous visit (from the past 48 hour(s)). No results found.  LIPID PANEL:    Component Value Date/Time   CHOL 130 12/28/2016 0825   TRIG 72 12/28/2016 0825   HDL 63 12/28/2016 0825   CHOLHDL 2.1 12/28/2016 0825   VLDL 36 (H) 07/06/2015 0939   LDLCALC 104 07/06/2015 0939    WEIGHTS: Wt Readings from Last 3 Encounters:  01/28/17 204 lb (92.5 kg)  01/03/17 206 lb 8 oz (93.7 kg)  12/31/16 208 lb (94.3 kg)    VITALS: BP 106/72   Pulse 74   Ht 5\' 11"  (1.803 m)   Wt 204 lb (  92.5 kg)   BMI 28.45 kg/m   EXAM: General appearance: alert and no distress Neck: no carotid bruit, no JVD and thyroid not enlarged, symmetric, no tenderness/mass/nodules Lungs: clear to auscultation bilaterally Heart: regular rate and rhythm, S1, S2 normal, no murmur, click, rub or gallop Abdomen: soft, non-tender; bowel sounds normal; no masses,  no organomegaly Extremities: extremities normal, atraumatic, no cyanosis or edema Pulses: 2+ and symmetric Skin: Skin color, texture, turgor normal. No rashes or lesions Neurologic: Grossly normal Psych: Pleasant  EKG: Normal sinus rhythm at 74-personally reviewed  ASSESSMENT: 1. Unstable angina-status post PCI to the RCA with DES (09/2016) 2. Tobacco abuse -quit 3. Calcification of the LAD 4. Dyslipidemia  PLAN: 1.   Joseph Jacobs had unstable angina and successful PCI to the RCA.  He stopped smoking and is undergoing cardiac rehab and continues to feel better.  His cholesterol now is at target on high-dose atorvastatin.  Plan to continue his current medications and will see him back in about 6 months.  Chrystie NoseKenneth C. Hilty, MD, [facility] IncFACC, FACP  [place]  [facility]  Medical Director of the [facility]  [facility] Attending Cardiologist  Direct Dial: [phone]  Fax: [phone]  Website:  [email or web address]  Kenneth C  Hilty 01/28/2017, 4:33 PM

## 2017-01-29 NOTE — Progress Notes (Signed)
Discharge Progress Report  Patient Details  Name: Joseph Jacobs MRN: [id number] Date of Birth: 1955/09/22 Referring Provider:     [place] from 11/01/2016 in [place]  Referring Provider  Dr Mali Hilty       Number of Visits: 16  Reason for Discharge:  Patient reached a stable level of exercise. Patient independent in their exercise. Patient has met program and personal goals.  Smoking History:  Social History   Tobacco Use  Smoking Status Former Smoker  . Packs/day: 0.33  . Years: 44.00  . Pack years: 14.52  . Types: Cigarettes  . Last attempt to quit: 09/29/2016  . Years since quitting: 0.4  Smokeless Tobacco Never Used    Diagnosis:  Status post coronary artery stent placement 10/12/16 DES RCA  ADL UCSD:   Initial Exercise Prescription: Initial Exercise Prescription - 11/01/16 1500      Date of Initial Exercise RX and Referring Provider   Date  11/01/16    Referring Provider  Dr Mali Hilty      Treadmill   MPH  3    Grade  0    Minutes  10    METs  3.3      Bike   Level  1    Minutes  10    METs  3.1      NuStep   Level  3    SPM  80    Minutes  10    METs  2.5      Prescription Details   Frequency (times per week)  3    Duration  Progress to 30 minutes of continuous aerobic without signs/symptoms of physical distress      Intensity   THRR 40-80% of Max Heartrate  64-128    Ratings of Perceived Exertion  11-13    Perceived Dyspnea  0-4      Progression   Progression  Continue to progress workloads to maintain intensity without signs/symptoms of physical distress.      Resistance Training   Training Prescription  Yes    Weight  4lbs    Reps  10-15       Discharge Exercise Prescription (Final Exercise Prescription Changes): Exercise Prescription Changes - 01/28/17 1454      Response to Exercise   Blood Pressure (Admit)  112/62    Blood Pressure (Exercise)  122/78     Blood Pressure (Exit)  100/70    Heart Rate (Admit)  89 bpm    Heart Rate (Exercise)  115 bpm    Heart Rate (Exit)  82 bpm    Rating of Perceived Exertion (Exercise)  13    Symptoms  none    Duration  Continue with 30 min of aerobic exercise without signs/symptoms of physical distress.    Intensity  THRR unchanged      Progression   Progression  Continue to progress workloads to maintain intensity without signs/symptoms of physical distress.    Average METs  4.3      Resistance Training   Training Prescription  Yes    Weight  5lbs    Reps  10-15    Time  10 Minutes      Interval Training   Interval Training  No      Bike   Level  4.5    Minutes  10    METs  5.6      NuStep   Level  6  SPM  80    Minutes  10    METs  4.5      Arm Ergometer   Level  3    Minutes  10    METs  2.7      Home Exercise Plan   Plans to continue exercise at  Home (comment)    Frequency  Add 2 additional days to program exercise sessions.    Initial Home Exercises Provided  11/26/16       Functional Capacity: 6 Minute Walk    Row Name 11/01/16 1552 01/28/17 1504       6 Minute Walk   Phase  Initial  Discharge    Distance  1704 feet  2166 feet    Distance % Change  -  27.11 %    Walk Time  6 minutes  6 minutes    # of Rest Breaks  0  0    MPH  3.22  4.1    METS  4.05  4.96    RPE  12  11    VO2 Peak  14.2  17.34    Symptoms  Yes (comment)  No    Comments  pt c/o dizziness and lightheadedness at the end of walk test  -    Resting HR  66 bpm  89 bpm    Resting BP  104/60  112/62    Resting Oxygen Saturation   97 %  -    Exercise Oxygen Saturation  during 6 min walk  98 %  -    Max Ex. HR  102 bpm  114 bpm    Max Ex. BP  124/72  122/78    2 Minute Post BP  102/60  100/70       Psychological, QOL, Others - Outcomes: PHQ 2/9: Depression screen [facility] 2/9 01/29/2017 10/16/2016 09/03/2016  Decreased Interest 3 1 0  Down, Depressed, Hopeless 0 1 0  PHQ - 2 Score 3 2 0  Altered  sleeping - 0 -  Tired, decreased energy - 2 -  Change in appetite - 0 -  Feeling bad or failure about yourself  - 0 -  Trouble concentrating - 2 -  Moving slowly or fidgety/restless - 2 -  Suicidal thoughts - 0 -  PHQ-9 Score - 8 -    Quality of Life: Quality of Life - 01/28/17 1643      Quality of Life Scores   Health/Function Pre  21.57 %    Health/Function Post  24.27 %    Health/Function % Change  12.52 %    Socioeconomic Pre  26.43 %    Socioeconomic Post  24.64 %    Socioeconomic % Change   -6.77 %    Psych/Spiritual Pre  25 %    Psych/Spiritual Post  25.71 %    Psych/Spiritual % Change  2.84 %    Family Pre  28.8 %    Family Post  28.3 %    Family % Change  -1.74 %    GLOBAL Pre  24.34 %    GLOBAL Post  25.24 %    GLOBAL % Change  3.7 %       Personal Goals: Goals established at orientation with interventions provided to work toward goal. Personal Goals and Risk Factors at Admission - 11/01/16 1629      Core Components/Risk Factors/Patient Goals on Admission    Weight Management  Yes;Weight Loss;Weight Maintenance    Intervention  Weight Management:  Provide education and appropriate resources to help participant work on and attain dietary goals.;Weight Management: Develop a combined nutrition and exercise program designed to reach desired caloric intake, while maintaining appropriate intake of nutrient and fiber, sodium and fats, and appropriate energy expenditure required for the weight goal.;Weight Management/Obesity: Establish reasonable short term and long term weight goals.    Expected Outcomes  Short Term: Continue to assess and modify interventions until short term weight is achieved;Weight Loss: Understanding of general recommendations for a balanced deficit meal plan, which promotes 1-2 lb weight loss per week and includes a negative energy balance of [id number] kcal/d;Weight Maintenance: Understanding of the daily nutrition guidelines, which includes 25-35%  calories from fat, 7% or less cal from saturated fats, less than 246m cholesterol, less than 1.5gm of sodium, & 5 or more servings of fruits and vegetables daily;Long Term: Adherence to nutrition and physical activity/exercise program aimed toward attainment of established weight goal;Understanding recommendations for meals to include 15-35% energy as protein, 25-35% energy from fat, 35-60% energy from carbohydrates, less than 2087mof dietary cholesterol, 20-35 gm of total fiber daily;Understanding of distribution of calorie intake throughout the day with the consumption of 4-5 meals/snacks    Tobacco Cessation  Yes    Number of packs per day  quit 10/11/16  smoked for 30 years at least 0.25 ppd    Intervention  Assist the participant in steps to quit. Provide individualized education and counseling about committing to Tobacco Cessation, relapse prevention, and pharmacological support that can be provided by physician.;OfAdvice workerassist with locating and accessing local/national Quit Smoking programs, and support quit date choice.    Expected Outcomes  Short Term: Will demonstrate readiness to quit, by selecting a quit date.;Short Term: Will quit all tobacco product use, adhering to prevention of relapse plan.;Long Term: Complete abstinence from all tobacco products for at least 12 months from quit date.    Lipids  Yes    Intervention  Provide education and support for participant on nutrition & aerobic/resistive exercise along with prescribed medications to achieve LDL <7031mHDL >74m15m  Expected Outcomes  Short Term: Participant states understanding of desired cholesterol values and is compliant with medications prescribed. Participant is following exercise prescription and nutrition guidelines.;Long Term: Cholesterol controlled with medications as prescribed, with individualized exercise RX and with personalized nutrition plan. Value goals: LDL < 70mg106mL > 40 mg.    Stress  Yes     Intervention  Refer participants experiencing significant psychosocial distress to appropriate mental health specialists for further evaluation and treatment. When possible, include family members and significant others in education/counseling sessions.        Personal Goals Discharge: Goals and Risk Factor Review    Row Name 11/29/16 1524 12/27/16 1805 01/25/17 1643         Core Components/Risk Factors/Patient Goals Review   Personal Goals Review  Weight Management/Obesity;Lipids;Tobacco Cessation  Weight Management/Obesity;Lipids;Tobacco Cessation  Weight Management/Obesity;Lipids;Tobacco Cessation     Review  ChrisGerald Stabsoing well with exercise. Vital signs have been stable.  ChrisGerald Stabsoing well with exercise. Vital signs have been stable.  ChrisGerald Stabsoing well with exercise. Vital signs have been stable.     Expected Outcomes  ChrisGerald Stabs continue to come to cardiac rehab for exericse and take his medicaitons as presribed. Will continue to encourage smoking cessation.  ChrisGerald Stabs continue to come to cardiac rehab for exericse and take his medicaitons as presribed. Will continue to encourage smoking cessation.  Gerald Stabs will continue to come to cardiac rehab for exericse and take his medicaitons as presribed. Will continue to encourage smoking cessation.        Exercise Goals and Review: Exercise Goals    Row Name 11/01/16 1434             Exercise Goals   Increase Physical Activity  Yes       Intervention  Provide advice, education, support and counseling about physical activity/exercise needs.;Develop an individualized exercise prescription for aerobic and resistive training based on initial evaluation findings, risk stratification, comorbidities and participant's personal goals.       Expected Outcomes  Achievement of increased cardiorespiratory fitness and enhanced flexibility, muscular endurance and strength shown through measurements of functional capacity and personal statement of  participant.       Increase Strength and Stamina  Yes improve endurance to continue to play golf       Intervention  Provide advice, education, support and counseling about physical activity/exercise needs.;Develop an individualized exercise prescription for aerobic and resistive training based on initial evaluation findings, risk stratification, comorbidities and participant's personal goals.       Expected Outcomes  Achievement of increased cardiorespiratory fitness and enhanced flexibility, muscular endurance and strength shown through measurements of functional capacity and personal statement of participant.       Able to understand and use rate of perceived exertion (RPE) scale  Yes       Intervention  Provide education and explanation on how to use RPE scale       Expected Outcomes  Short Term: Able to use RPE daily in rehab to express subjective intensity level;Long Term:  Able to use RPE to guide intensity level when exercising independently       Knowledge and understanding of Target Heart Rate Range (THRR)  Yes       Intervention  Provide education and explanation of THRR including how the numbers were predicted and where they are located for reference       Expected Outcomes  Short Term: Able to state/look up THRR;Long Term: Able to use THRR to govern intensity when exercising independently;Short Term: Able to use daily as guideline for intensity in rehab       Able to check pulse independently  Yes       Intervention  Provide education and demonstration on how to check pulse in carotid and radial arteries.;Review the importance of being able to check your own pulse for safety during independent exercise       Expected Outcomes  Short Term: Able to explain why pulse checking is important during independent exercise;Long Term: Able to check pulse independently and accurately       Understanding of Exercise Prescription  Yes       Intervention  Provide education, explanation, and written  materials on patient's individual exercise prescription       Expected Outcomes  Short Term: Able to explain program exercise prescription;Long Term: Able to explain home exercise prescription to exercise independently          Nutrition & Weight - Outcomes: Pre Biometrics - 11/01/16 1628      Pre Biometrics   Height  5' 9.75" (1.772 m)    Weight  201 lb 11.5 oz (91.5 kg)    Waist Circumference  40.5 inches    Hip Circumference  41 inches    Waist to Hip Ratio  0.99 %    BMI (Calculated)  29.14  Triceps Skinfold  20 mm    % Body Fat  29 %    Grip Strength  46 kg    Flexibility  0 in    Single Leg Stand  30 seconds      Post Biometrics - 01/28/17 1504       Post  Biometrics   Height  5' 9.75" (1.772 m)    Weight  201 lb 15.1 oz (91.6 kg)    Waist Circumference  41.5 inches    Hip Circumference  41 inches    Waist to Hip Ratio  1.01 %    BMI (Calculated)  29.17    Triceps Skinfold  20.5 mm    % Body Fat  29.6 %    Grip Strength  47.5 kg    Flexibility  0 in    Single Leg Stand  30 seconds       Nutrition: Nutrition Therapy & Goals - 11/07/16 1036      Nutrition Therapy   Diet  Therapeutic Lifestyle Changes      Personal Nutrition Goals   Nutrition Goal  Pt to identify and limit food sources of saturated fat, trans fat, and sodium    Personal Goal #2  Pt to identify food quantities necessary to achieve weight loss of 6-24 lb (2.7-10.9 kg) at graduation from cardiac rehab.       Intervention Plan   Intervention  Prescribe, educate and counsel regarding individualized specific dietary modifications aiming towards targeted core components such as weight, hypertension, lipid management, diabetes, heart failure and other comorbidities.    Expected Outcomes  Short Term Goal: Understand basic principles of dietary content, such as calories, fat, sodium, cholesterol and nutrients.;Long Term Goal: Adherence to prescribed nutrition plan.       Nutrition  Discharge: Nutrition Assessments - 02/06/17 1410      MEDFICTS Scores   Pre Score  39    Post Score  45    Score Difference  6       Education Questionnaire Score: Knowledge Questionnaire Score - 01/28/17 1637      Knowledge Questionnaire Score   Pre Score  24/28    Post Score  22/24       Goals reviewed with patient. Pt graduated from cardiac rehab program today with completion of 16 exercise sessions in Phase II. Pt maintained fair attendance due to demanding work schedule. Pt progressed nicely during his participation in rehab as evidenced by increased MET level.   Medication list reconciled. Repeat  PHQ score-0. Pt completed post quality of life which showed increase in all the areas.  Pt has made significant lifestyle changes and should be commended for his success. Pt feels he has achieved his goals during cardiac rehab.  Pt has increase stamina, endurance.  Pt unable to proceed with playing golf due to winter weather and wearing a boot on his foot. Pt feels confident that he will be able play golf in the spring when the weather permits.  Pt plans to continue exercise with his wife at a gym three times a week with similar routine to his rehab exercise. Pt also has access to fitness center on campus. It was a pleasure to have this delightful patient in our rehab program. Maurice Small RN, BSN Cardiac and Pulmonary Rehab Nurse Navigator

## 2017-01-30 ENCOUNTER — Encounter (HOSPITAL_COMMUNITY): Payer: BC Managed Care – PPO

## 2017-02-01 ENCOUNTER — Encounter (HOSPITAL_COMMUNITY): Payer: BC Managed Care – PPO

## 2017-02-04 ENCOUNTER — Encounter (HOSPITAL_COMMUNITY): Payer: BC Managed Care – PPO

## 2017-02-06 ENCOUNTER — Encounter (HOSPITAL_COMMUNITY): Payer: BC Managed Care – PPO

## 2017-02-08 ENCOUNTER — Encounter (HOSPITAL_COMMUNITY): Payer: BC Managed Care – PPO

## 2017-02-11 ENCOUNTER — Encounter (HOSPITAL_COMMUNITY): Payer: BC Managed Care – PPO

## 2017-02-13 ENCOUNTER — Encounter (HOSPITAL_COMMUNITY): Payer: BC Managed Care – PPO

## 2017-02-14 ENCOUNTER — Telehealth: Payer: BC Managed Care – PPO | Admitting: Family

## 2017-02-14 DIAGNOSIS — J111 Influenza due to unidentified influenza virus with other respiratory manifestations: Secondary | ICD-10-CM

## 2017-02-14 MED ORDER — OSELTAMIVIR PHOSPHATE 75 MG PO CAPS: 75.0000 mg | ORAL_CAPSULE | Freq: Two times a day (BID) | ORAL | 0 refills | Status: DC

## 2017-02-14 MED ORDER — PREDNISONE 5 MG PO TABS: 5.0000 mg | ORAL_TABLET | ORAL | 0 refills | Status: DC

## 2017-02-14 NOTE — Progress Notes (Signed)
Thank you for the details you included in the comment boxes. Those details are very helpful in determining the best course of treatment for you and help us to provide the best care. Given your history of respiratory problems, I am sending a prednisone pack with the flu treatment below to help you breathe better.   E visit for Flu like symptoms   We are sorry that you are not feeling well.  Here is how we plan to help! Based on what you have shared with me it looks like you may have a respiratory virus that may be influenza.  Influenza or "the flu" is   an infection caused by a respiratory virus. The flu virus is highly contagious and persons who did not receive their yearly flu vaccination may "catch" the flu from close contact.  We have anti-viral medications to treat the viruses that cause this infection. They are not a "cure" and only shorten the course of the infection. These prescriptions are most effective when they are given within the first 2 days of "flu" symptoms. Antiviral medication are indicated if you have a high risk of complications from the flu. You should  also consider an antiviral medication if you are in close contact with someone who is at risk. These medications can help patients avoid complications from the flu  but have side effects that you should know. Possible side effects from Tamiflu or oseltamivir include nausea, vomiting, diarrhea, dizziness, headaches, eye redness, sleep problems or other respiratory symptoms. You should not take Tamiflu if you have an allergy to oseltamivir or any to the ingredients in Tamiflu.  Based upon your symptoms and potential risk factors I have prescribed Oseltamivir (Tamiflu).  It has been sent to your designated pharmacy.  You will take one 75 mg capsule orally twice a day for the next 5 days.  ANYONE WHO HAS FLU SYMPTOMS SHOULD: . Stay home. The flu is highly contagious and going out or to work exposes others! . Be sure to drink plenty of  fluids. Water is fine as well as fruit juices, sodas and electrolyte beverages. You may want to stay away from caffeine or alcohol. If you are nauseated, try taking small sips of liquids. How do you know if you are getting enough fluid? Your urine should be a pale yellow or almost colorless. . Get rest. . Taking a steamy shower or using a humidifier may help nasal congestion and ease sore throat pain. Using a saline nasal spray works much the same way. . Cough drops, hard candies and sore throat lozenges may ease your cough. . Line up a caregiver. Have someone check on you regularly.   GET HELP RIGHT AWAY IF: . You cannot keep down liquids or your medications. . You become short of breath . Your fell like you are going to pass out or loose consciousness. . Your symptoms persist after you have completed your treatment plan MAKE SURE YOU   Understand these instructions.  Will watch your condition.  Will get help right away if you are not doing well or get worse.  Your e-visit answers were reviewed by a board certified advanced clinical practitioner to complete your personal care plan.  Depending on the condition, your plan could have included both over the counter or prescription medications.  If there is a problem please reply  once you have received a response from your provider.  Your safety is important to us.  If you have drug allergies check your  prescription carefully.    You can use MyChart to ask questions about today's visit, request a non-urgent call back, or ask for a work or school excuse for 24 hours related to this e-Visit. If it has been greater than 24 hours you will need to follow up with your provider, or enter a new e-Visit to address those concerns.  You will get an e-mail in the next two days asking about your experience.  I hope that your e-visit has been valuable and will speed your recovery. Thank you for using e-visits.

## 2017-02-15 ENCOUNTER — Encounter (HOSPITAL_COMMUNITY): Payer: BC Managed Care – PPO

## 2017-02-17 ENCOUNTER — Telehealth: Payer: BC Managed Care – PPO | Admitting: Family

## 2017-02-17 DIAGNOSIS — J019 Acute sinusitis, unspecified: Secondary | ICD-10-CM

## 2017-02-17 MED ORDER — AMOXICILLIN-POT CLAVULANATE 875-125 MG PO TABS: 1.0000 | ORAL_TABLET | Freq: Two times a day (BID) | ORAL | 0 refills | Status: DC

## 2017-02-17 NOTE — Progress Notes (Signed)

## 2017-02-18 ENCOUNTER — Encounter (HOSPITAL_COMMUNITY): Payer: BC Managed Care – PPO

## 2017-02-20 ENCOUNTER — Encounter (HOSPITAL_COMMUNITY): Payer: BC Managed Care – PPO

## 2017-03-15 ENCOUNTER — Other Ambulatory Visit: Payer: Self-pay

## 2017-03-15 DIAGNOSIS — J449 Chronic obstructive pulmonary disease, unspecified: Secondary | ICD-10-CM

## 2017-03-15 MED ORDER — ALBUTEROL SULFATE HFA 108 (90 BASE) MCG/ACT IN AERS: 2.0000 | INHALATION_SPRAY | Freq: Four times a day (QID) | RESPIRATORY_TRACT | 0 refills | Status: DC | PRN

## 2017-03-19 ENCOUNTER — Encounter: Payer: Self-pay | Admitting: Sports Medicine

## 2017-03-19 MED ORDER — MONTELUKAST SODIUM 10 MG PO TABS: 10.0000 mg | ORAL_TABLET | Freq: Every day | ORAL | 3 refills | Status: DC

## 2017-06-13 ENCOUNTER — Ambulatory Visit (INDEPENDENT_AMBULATORY_CARE_PROVIDER_SITE_OTHER): Payer: BC Managed Care – PPO

## 2017-06-13 ENCOUNTER — Encounter: Payer: Self-pay | Admitting: Sports Medicine

## 2017-06-13 ENCOUNTER — Ambulatory Visit: Payer: BC Managed Care – PPO | Admitting: Sports Medicine

## 2017-06-13 DIAGNOSIS — M1811 Unilateral primary osteoarthritis of first carpometacarpal joint, right hand: Secondary | ICD-10-CM | POA: Diagnosis not present

## 2017-06-13 DIAGNOSIS — M25541 Pain in joints of right hand: Secondary | ICD-10-CM

## 2017-06-13 MED ORDER — DICLOFENAC SODIUM 2 % TD SOLN: 2.0000 | Freq: Two times a day (BID) | TRANSDERMAL | 11 refills | Status: DC

## 2017-06-13 NOTE — Assessment & Plan Note (Signed)
Injection as above, topical Pennsaid (diclofenac 2% topical). Return to see me in 1 month, x-rays. Rheumatoid testing if no better.

## 2017-06-13 NOTE — Progress Notes (Signed)
Subjective:    CC: Right hand pain  HPI: This is a pleasant 62 year old male, for the past several weeks he said increasing pain that he localizes the base of the thumb, moderate, persistent, no radiation.  Significant gelling, no trauma.    I reviewed the past medical history, family history, social history, surgical history, and allergies today and no changes were needed.  Please see the problem list section below in epic for further details.  Past Medical History: Past Medical History:  Diagnosis Date  . Anginal pain (HCC)   . Arthritis    "hands, knees, back, rightt foot" (10/11/2016)  . Asthma   . Chronic bronchitis (HCC)    "get it most years" (10/11/2016)  . Chronic lower back pain   . Coronary artery disease   . High cholesterol   . Lumbar degenerative disc disease 12/14/2013  . Migraine    "daily to weekly" (10/11/2016)  . Pneumonia    "several times; last time was in 03/2016" (10/11/2016)  . Sleep apnea   . Stress fracture of foot 12/14/2013   "right"   Past Surgical History: Past Surgical History:  Procedure Laterality Date  . BILATERAL CARPAL TUNNEL RELEASE Bilateral   . CORONARY ANGIOPLASTY WITH STENT PLACEMENT  10/11/2016  . CORONARY STENT INTERVENTION N/A 10/11/2016   Procedure: CORONARY STENT INTERVENTION;  Surgeon: Runell Gess, MD;  [facility];  Service: Cardiovascular;  Laterality: N/A;  prox RCA  . KNEE ARTHROSCOPY Left 1978  . KNEE CARTILAGE SURGERY Left   . LEFT HEART CATH AND CORONARY ANGIOGRAPHY N/A 10/11/2016   Procedure: LEFT HEART CATH AND CORONARY ANGIOGRAPHY;  Surgeon: Runell Gess, MD;  Location: [facility];  Service: Cardiovascular;  Laterality: N/A;   Social History: Social History   Socioeconomic History  . Marital status: Married    Spouse name: [name]  . Number of children: 2  . Years of education: Masters  . Highest education level: Not on file  Occupational History  . Occupation: [employer]  Social Needs   . Financial resource strain: Not on file  . Food insecurity:    Worry: Not on file    Inability: Not on file  . Transportation needs:    Medical: Not on file    Non-medical: Not on file  Tobacco Use  . Smoking status: Former Smoker    Packs/day: 0.33    Years: 44.00    Pack years: 14.52    Types: Cigarettes    Last attempt to quit: 09/29/2016    Years since quitting: 0.7  . Smokeless tobacco: Never Used  Substance and Sexual Activity  . Alcohol use: Yes    Alcohol/week: 3.6 oz    Types: 3 Glasses of wine, 3 Standard drinks or equivalent per week  . Drug use: No  . Sexual activity: Not Currently    Partners: Female  Lifestyle  . Physical activity:    Days per week: Not on file    Minutes per session: Not on file  . Stress: Not on file  Relationships  . Social connections:    Talks on phone: Not on file    Gets together: Not on file    Attends religious service: Not on file    Active member of club or organization: Not on file    Attends meetings of clubs or organizations: Not on file    Relationship status: Not on file  Other Topics Concern  . Not on file  Social History  Narrative   Lives with wife and son   Caffeine use: Coffee daily   Right handed    Family History: Family History  Problem Relation Age of Onset  . Brain cancer Father   . Alcoholism Mother   . Diabetes Mother        father   . Lung cancer Mother   . Cancer Unknown   . Cancer - Prostate Brother    Allergies: Allergies  Allergen Reactions  . Azithromycin Other (See Comments)    Tingling/swelling in lips/tongue  . E-Mycin [Erythromycin] Nausea Only    GI upset    Medications: See med rec.  Review of Systems: No fevers, chills, night sweats, weight loss, chest pain, or shortness of breath.   Objective:    General: Well Developed, well nourished, and in no acute distress.  Neuro: Alert and oriented x3, extra-ocular muscles intact, sensation grossly intact.  HEENT: Normocephalic,  atraumatic, pupils equal round reactive to light, neck supple, no masses, no lymphadenopathy, thyroid nonpalpable.  Skin: Warm and dry, no rashes. Cardiac: Regular rate and rhythm, no murmurs rubs or gallops, no lower extremity edema.  Respiratory: Clear to auscultation bilaterally. Not using accessory muscles, speaking in full sentences. Right hand: Tender palpation of the thumb basal joint, reproduction of pain with extension of the first ray.  Procedure: Real-time Ultrasound Guided Injection of right trapeziometacarpal joint Device: GE Logiq E  Verbal informed consent obtained.  Time-out conducted.  Noted no overlying erythema, induration, or other signs of local infection.  Skin prepped in a sterile fashion.  Local anesthesia: Topical Ethyl chloride.  With sterile technique and under real time ultrasound guidance: 1/2 cc kenalog 40, 1/2 cc lidocaine injected easily Completed without difficulty  Pain immediately resolved suggesting accurate placement of the medication.  Advised to call if fevers/chills, erythema, induration, drainage, or persistent bleeding.  Images permanently stored and available for review in the ultrasound unit.  Impression: Technically successful ultrasound guided injection.  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 Return to see me in 1 month, x-rays. Rheumatoid testing if no better. ___________________________________________ [name]. Benjamin Stain, M.D., ABFM., CAQSM. Primary Care and Sports Medicine [place] [facility]  Adjunct Instructor of Family Medicine  [employer]

## 2017-06-14 ENCOUNTER — Other Ambulatory Visit: Payer: Self-pay | Admitting: Sports Medicine

## 2017-06-14 ENCOUNTER — Telehealth: Payer: Self-pay

## 2017-06-14 MED ORDER — SUMATRIPTAN SUCCINATE 100 MG PO TABS: ORAL_TABLET | ORAL | 11 refills | Status: DC

## 2017-06-14 NOTE — Telephone Encounter (Signed)
thanks

## 2017-06-14 NOTE — Telephone Encounter (Signed)
Done

## 2017-07-04 ENCOUNTER — Ambulatory Visit: Payer: BC Managed Care – PPO | Admitting: Neurology

## 2017-07-11 ENCOUNTER — Ambulatory Visit: Payer: BC Managed Care – PPO | Admitting: Sports Medicine

## 2017-08-19 ENCOUNTER — Encounter: Payer: Self-pay | Admitting: Sports Medicine

## 2017-08-24 ENCOUNTER — Other Ambulatory Visit: Payer: Self-pay | Admitting: Internal Medicine

## 2017-08-24 DIAGNOSIS — G43809 Other migraine, not intractable, without status migrainosus: Secondary | ICD-10-CM

## 2017-08-24 DIAGNOSIS — I2510 Atherosclerotic heart disease of native coronary artery without angina pectoris: Secondary | ICD-10-CM

## 2017-08-24 DIAGNOSIS — I2584 Coronary atherosclerosis due to calcified coronary lesion: Principal | ICD-10-CM

## 2017-08-24 DIAGNOSIS — R072 Precordial pain: Secondary | ICD-10-CM

## 2017-10-02 ENCOUNTER — Ambulatory Visit: Payer: BC Managed Care – PPO | Admitting: Sports Medicine

## 2017-10-02 ENCOUNTER — Encounter: Payer: Self-pay | Admitting: Sports Medicine

## 2017-10-02 DIAGNOSIS — I2510 Atherosclerotic heart disease of native coronary artery without angina pectoris: Secondary | ICD-10-CM | POA: Diagnosis not present

## 2017-10-02 DIAGNOSIS — M19071 Primary osteoarthritis, right ankle and foot: Secondary | ICD-10-CM

## 2017-10-02 DIAGNOSIS — Z955 Presence of coronary angioplasty implant and graft: Secondary | ICD-10-CM | POA: Diagnosis not present

## 2017-10-02 NOTE — Progress Notes (Signed)
Subjective:    CC: Multiple issues  HPI: Right CMC arthritis: Continues to do well after injection many months ago.  Right foot pain: Widespread osteoarthritis, pain has been predominantly at the first MTP, previous injection was done with ultrasound guidance back in November 2017 with good effect.  He is interested in a surgical opinion.  Coronary artery disease: Post drug-eluting stent placement, does need to keep follow-up with cardiology, no episodes of chest pain, lipids are on target.  I reviewed the past medical history, family history, social history, surgical history, and allergies today and no changes were needed.  Please see the problem list section below in epic for further details.  Past Medical History: Past Medical History:  Diagnosis Date  . Anginal pain (HCC)   . Arthritis    "hands, knees, back, rightt foot" (10/11/2016)  . Asthma   . Chronic bronchitis (HCC)    "get it most years" (10/11/2016)  . Chronic lower back pain   . Coronary artery disease   . High cholesterol   . Lumbar degenerative disc disease 12/14/2013  . Migraine    "daily to weekly" (10/11/2016)  . Pneumonia    "several times; last time was in 03/2016" (10/11/2016)  . Sleep apnea   . Stress fracture of foot 12/14/2013   "right"   Past Surgical History: Past Surgical History:  Procedure Laterality Date  . BILATERAL CARPAL TUNNEL RELEASE Bilateral   . CORONARY ANGIOPLASTY WITH STENT PLACEMENT  10/11/2016  . CORONARY STENT INTERVENTION N/A 10/11/2016   Procedure: CORONARY STENT INTERVENTION;  Surgeon: Runell Gess, MD;  Location: [facility];  Service: Cardiovascular;  Laterality: N/A;  prox RCA  . KNEE ARTHROSCOPY Left 1978  . KNEE CARTILAGE SURGERY Left   . LEFT HEART CATH AND CORONARY ANGIOGRAPHY N/A 10/11/2016   Procedure: LEFT HEART CATH AND CORONARY ANGIOGRAPHY;  Surgeon: Runell Gess, MD;  Location: [facility];  Service: Cardiovascular;  Laterality: N/A;   Social  History: Social History   Socioeconomic History  . Marital status: Married    Spouse name: [name]  . Number of children: 2  . Years of education: Masters  . Highest education level: Not on file  Occupational History  . Occupation: [employer]  Social Needs  . Financial resource strain: Not on file  . Food insecurity:    Worry: Not on file    Inability: Not on file  . Transportation needs:    Medical: Not on file    Non-medical: Not on file  Tobacco Use  . Smoking status: Former Smoker    Packs/day: 0.33    Years: 44.00    Pack years: 14.52    Types: Cigarettes    Last attempt to quit: 09/29/2016    Years since quitting: 1.0  . Smokeless tobacco: Never Used  Substance and Sexual Activity  . Alcohol use: Yes    Alcohol/week: 6.0 standard drinks    Types: 3 Glasses of wine, 3 Standard drinks or equivalent per week  . Drug use: No  . Sexual activity: Not Currently    Partners: Female  Lifestyle  . Physical activity:    Days per week: Not on file    Minutes per session: Not on file  . Stress: Not on file  Relationships  . Social connections:    Talks on phone: Not on file    Gets together: Not on file    Attends religious service: Not on file    Active member of  club or organization: Not on file    Attends meetings of clubs or organizations: Not on file    Relationship status: Not on file  Other Topics Concern  . Not on file  Social History Narrative   Lives with wife and son   Caffeine use: Coffee daily   Right handed    Family History: Family History  Problem Relation Age of Onset  . Brain cancer Father   . Alcoholism Mother   . Diabetes Mother        father   . Lung cancer Mother   . Cancer Unknown   . Cancer - Prostate Brother    Allergies: Allergies  Allergen Reactions  . Azithromycin Other (See Comments)    Tingling/swelling in lips/tongue  . E-Mycin [Erythromycin] Nausea Only    GI upset    Medications: See med rec.  Review of Systems: No fevers,  chills, night sweats, weight loss, chest pain, or shortness of breath.   Objective:    General: Well Developed, well nourished, and in no acute distress.  Neuro: Alert and oriented x3, extra-ocular muscles intact, sensation grossly intact.  HEENT: Normocephalic, atraumatic, pupils equal round reactive to light, neck supple, no masses, no lymphadenopathy, thyroid nonpalpable.  Skin: Warm and dry, no rashes. Cardiac: Regular rate and rhythm, no murmurs rubs or gallops, no lower extremity edema.  Respiratory: Clear to auscultation bilaterally. Not using accessory muscles, speaking in full sentences.  Impression and Recommendations:    Primary osteoarthritis of right foot First MTP injection was last done with ultrasound guidance in November 2017. Persistent discomfort, referral to Dr. Victorino Dike.  Presence of stent in coronary artery in patient with coronary artery disease Doing well after stenting about a year ago. Lipids are on target. He would like to touch base with Dr. Jens Som downstairs.  I spent 25 minutes with this patient, greater than 50% was face-to-face time counseling regarding the above diagnoses ___________________________________________ Joseph Jacobs. Benjamin Stain, M.D., ABFM., CAQSM. Primary Care and Sports Medicine [place] [facility]  Adjunct Instructor of Family Medicine  [employer]

## 2017-10-02 NOTE — Assessment & Plan Note (Signed)
First MTP injection was last done with ultrasound guidance in November 2017. Persistent discomfort, referral to Dr. Victorino Dike.

## 2017-10-02 NOTE — Assessment & Plan Note (Signed)
Doing well after stenting about a year ago. Lipids are on target. He would like to touch base with Dr. Jens Som downstairs.

## 2017-10-07 ENCOUNTER — Ambulatory Visit (INDEPENDENT_AMBULATORY_CARE_PROVIDER_SITE_OTHER): Payer: BC Managed Care – PPO | Admitting: Sports Medicine

## 2017-10-07 DIAGNOSIS — M1811 Unilateral primary osteoarthritis of first carpometacarpal joint, right hand: Secondary | ICD-10-CM | POA: Diagnosis not present

## 2017-10-07 NOTE — Progress Notes (Signed)
Subjective:    CC: Right hand pain  HPI: Joseph Jacobs is a pleasant 62 year old male, he is the Public house manager of admissions at [employer], for the past couple of weeks he has had pain that he localizes at the base of the thumb on the right, severe initially but now moderate.  Topical anti-inflammatories are not effective.  He desires interventional treatment today.  Pain is localized without radiation.  I reviewed the past medical history, family history, social history, surgical history, and allergies today and no changes were needed.  Please see the problem list section below in epic for further details.  Past Medical History: Past Medical History:  Diagnosis Date  . Anginal pain (HCC)   . Arthritis    "hands, knees, back, rightt foot" (10/11/2016)  . Asthma   . Chronic bronchitis (HCC)    "get it most years" (10/11/2016)  . Chronic lower back pain   . Coronary artery disease   . High cholesterol   . Lumbar degenerative disc disease 12/14/2013  . Migraine    "daily to weekly" (10/11/2016)  . Pneumonia    "several times; last time was in 03/2016" (10/11/2016)  . Sleep apnea   . Stress fracture of foot 12/14/2013   "right"   Past Surgical History: Past Surgical History:  Procedure Laterality Date  . BILATERAL CARPAL TUNNEL RELEASE Bilateral   . CORONARY ANGIOPLASTY WITH STENT PLACEMENT  10/11/2016  . CORONARY STENT INTERVENTION N/A 10/11/2016   Procedure: CORONARY STENT INTERVENTION;  Surgeon: Runell Gess, MD;  Location: [facility];  Service: Cardiovascular;  Laterality: N/A;  prox RCA  . KNEE ARTHROSCOPY Left 1978  . KNEE CARTILAGE SURGERY Left   . LEFT HEART CATH AND CORONARY ANGIOGRAPHY N/A 10/11/2016   Procedure: LEFT HEART CATH AND CORONARY ANGIOGRAPHY;  Surgeon: Runell Gess, MD;  Location: [facility];  Service: Cardiovascular;  Laterality: N/A;   Social History: Social History   Socioeconomic History  . Marital status: Married    Spouse name: [name]  .  Number of children: 2  . Years of education: Masters  . Highest education level: Not on file  Occupational History  . Occupation: [employer]  Social Needs  . Financial resource strain: Not on file  . Food insecurity:    Worry: Not on file    Inability: Not on file  . Transportation needs:    Medical: Not on file    Non-medical: Not on file  Tobacco Use  . Smoking status: Former Smoker    Packs/day: 0.33    Years: 44.00    Pack years: 14.52    Types: Cigarettes    Last attempt to quit: 09/29/2016    Years since quitting: 1.0  . Smokeless tobacco: Never Used  Substance and Sexual Activity  . Alcohol use: Yes    Alcohol/week: 6.0 standard drinks    Types: 3 Glasses of wine, 3 Standard drinks or equivalent per week  . Drug use: No  . Sexual activity: Not Currently    Partners: Female  Lifestyle  . Physical activity:    Days per week: Not on file    Minutes per session: Not on file  . Stress: Not on file  Relationships  . Social connections:    Talks on phone: Not on file    Gets together: Not on file    Attends religious service: Not on file    Active member of club or organization: Not on file    Attends meetings of  clubs or organizations: Not on file    Relationship status: Not on file  Other Topics Concern  . Not on file  Social History Narrative   Lives with wife and son   Caffeine use: Coffee daily   Right handed    Family History: Family History  Problem Relation Age of Onset  . Brain cancer Father   . Alcoholism Mother   . Diabetes Mother        father   . Lung cancer Mother   . Cancer Unknown   . Cancer - Prostate Brother    Allergies: Allergies  Allergen Reactions  . Azithromycin Other (See Comments)    Tingling/swelling in lips/tongue  . E-Mycin [Erythromycin] Nausea Only    GI upset    Medications: See med rec.  Review of Systems: No fevers, chills, night sweats, weight loss, chest pain, or shortness of breath.   Objective:    General: Well  Developed, well nourished, and in no acute distress.  Neuro: Alert and oriented x3, extra-ocular muscles intact, sensation grossly intact.  HEENT: Normocephalic, atraumatic, pupils equal round reactive to light, neck supple, no masses, no lymphadenopathy, thyroid nonpalpable.  Skin: Warm and dry, no rashes. Cardiac: Regular rate and rhythm, no murmurs rubs or gallops, no lower extremity edema.  Respiratory: Clear to auscultation bilaterally. Not using accessory muscles, speaking in full sentences. Right hand: Tender to palpation of the thumb basal joint.  Procedure: Real-time Ultrasound Guided Injection of right first carpometacarpal joint Device: GE Logiq E  Verbal informed consent obtained.  Time-out conducted.  Noted no overlying erythema, induration, or other signs of local infection.  Skin prepped in a sterile fashion.  Local anesthesia: Topical Ethyl chloride.  With sterile technique and under real time ultrasound guidance: 25-gauge needle dropped down in between the trapezium and the first metacarpal, 1/2 cc kenalog 40, 1/2 cc lidocaine injected easily. Completed without difficulty  Pain immediately resolved suggesting accurate placement of the medication.  Advised to call if fevers/chills, erythema, induration, drainage, or persistent bleeding.  Images permanently stored and available for review in the ultrasound unit.  Impression: Technically successful ultrasound guided injection.  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 Topical diclofenac is not sufficiently effective. Previous injection was 4 months ago. Return as needed.  ___________________________________________ [name]. Benjamin Stain, M.D., ABFM., CAQSM. Primary Care and Sports Medicine [place] [facility]  Adjunct Instructor of Family Medicine  [employer]

## 2017-10-07 NOTE — Assessment & Plan Note (Signed)
Repeat right first CMC injection. Topical diclofenac is not sufficiently effective. Previous injection was 4 months ago. Return as needed.

## 2017-10-08 ENCOUNTER — Telehealth: Payer: Self-pay | Admitting: Cardiology

## 2017-10-08 NOTE — Telephone Encounter (Signed)
Patient is asking if he should continue to take his Brilinta until his next appt which is October.

## 2017-10-10 ENCOUNTER — Telehealth: Payer: Self-pay | Admitting: Internal Medicine

## 2017-10-10 NOTE — Telephone Encounter (Signed)
Stay on Brilinta until I see him in the office in October, thanks.  Dr. HRexene Edison

## 2017-10-10 NOTE — Telephone Encounter (Signed)
Follow Up: ° ° ° °Patient returning call.  °

## 2017-10-10 NOTE — Telephone Encounter (Signed)
Message has been sent to MD to advise on brilinta. He had cath/stent 09/2016  Message has been sent to scheduler to r/s patient for Dr. Rennis GoldenHilty instead of Dr. Alphonsa Ginrenshw (as a new patient)

## 2017-10-10 NOTE — Telephone Encounter (Signed)
Spoke with patient. Advised Joseph Jacobs that I am waiting to hear from MD concerning Brilinta. He reports NO chest pain/pressure/tightness and no SOB.   He states his PCP told Joseph Jacobs that Joseph Jacobs was in the same building as he and suggested that he could see Joseph Jacobs to discuss Brilinta. Patient states he is happy with Joseph Jacobs and wishes to f/up with Joseph Jacobs. Cancelled patient's 10/9 new patient OV with Joseph Jacobs and r/s Joseph Jacobs to see Joseph Jacobs on 9/16.

## 2017-10-10 NOTE — Telephone Encounter (Signed)
Spoke with patient. He has no chest pain, pressure, tightness. No shortness of breath.

## 2017-10-10 NOTE — Telephone Encounter (Signed)
Called patient with MD advice. He states he has already requested another refill from pharmacy so he will continue med and discuss with MD Monday

## 2017-10-10 NOTE — Telephone Encounter (Signed)
LMTCB to discuss Brilinta (waiting on MD input) and r/s office visit. [place] [employer] [place]uler, she is unable to make an appointment for Dr. Rennis GoldenHilty.

## 2017-10-14 ENCOUNTER — Ambulatory Visit: Payer: BC Managed Care – PPO | Admitting: Internal Medicine

## 2017-10-14 ENCOUNTER — Encounter: Payer: Self-pay | Admitting: Internal Medicine

## 2017-10-14 VITALS — BP 120/74 | HR 67 | Ht 71.0 in | Wt 201.0 lb

## 2017-10-14 DIAGNOSIS — E785 Hyperlipidemia, unspecified: Secondary | ICD-10-CM | POA: Diagnosis not present

## 2017-10-14 DIAGNOSIS — I2510 Atherosclerotic heart disease of native coronary artery without angina pectoris: Secondary | ICD-10-CM

## 2017-10-14 NOTE — Progress Notes (Signed)
OFFICE CONSULT NOTE  Chief Complaint:  No complaints  Primary Care Physician: Monica Bectonhekkekandam, Thomas J, MD  HPI:  Joseph Jacobs is a 62 y.o. male who is being seen today for the evaluation of chest pain at the request of Monica Bectonhekkekandam, Thomas J,*. Mr. Joseph Jacobs was referred for evaluation of chest and arm pain. He has a history of asthma and migraine headaches. 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 He is a quarter pack per day smoker 30 years [place] His only exercise is playing golf several times a week but he still manages to score in the 70s. Recently he's had some chest pressure. He says it comes on with or without exertion and is relieved by rest. Sometimes he has radiating symptoms down the left arm, associated with some numbness and tingling. There is a family history of diabetes but no coronary artery disease. Both parents had cancer. He denies any worsening shortness of breath with exertion. Recent labs indicate an LDL of 104. He also had a CT angiogram of the chest to rule out a pulmonary embolism in June 2017 after similar symptoms of chest pain. This demonstrated calcification in the LAD. He was seen by Carlean JewsKatie Thompson, PA-C, who ordered a Lexiscan Myoview on 07/19/2015 which showed normal LV function and no ischemia.  01/28/2017  Mr. Joseph Jacobs returns today for follow-up.  He was referred for left heart catheterization and coronary angiography and underwent PCI to the RCA which is 95% stenotic and had drug-eluting stent placement on 10/11/2016.  Subsequently he is done well without any recurrent anginal symptoms.  He had reported some palpitations about a month afterwards and underwent a monitor which showed occasional PACs and PVCs.  There is a brief nonsustained atrial run.  Medications were adjusted and he is done well.  Recently a lipid  profile was obtained showing total cholesterol 130, triglycerides 63, HDL 72, LDL 52.  He is participating cardiac rehab and denies any worsening shortness of breath or chest pain.  He still on dual antiplatelet therapy with aspirin and Brilinta.  He is on high-dose atorvastatin and metoprolol.  10/14/2017  Mr. Joseph Jacobs is seen today in follow-up.  He continues to do well after PCI in September 2018.  He denies any further chest pain or worsening shortness of breath.  He is been able to resume exercise without any difficulty.  He does report easy bruising, likely due to being on dual antiplatelet therapy.  Recent lipid profile shows excellent control with total cholesterol 130, HDL 63, triglycerides 72 and LDL 52.  He is on high intensity atorvastatin 80 mg.  PMHx:  Past Medical History:  Diagnosis Date  . Anginal pain (HCC)   . Arthritis    "hands, knees, back, rightt foot" (10/11/2016)  . Asthma   . Chronic bronchitis (HCC)    "get it most years" (10/11/2016)  . Chronic lower back pain   . Coronary artery disease   . High cholesterol   . Lumbar degenerative disc disease 12/14/2013  . Migraine    "daily to weekly" (10/11/2016)  . Pneumonia    "several times; last time was in 03/2016" (10/11/2016)  . Sleep apnea   . Stress fracture of foot 12/14/2013   "right"    Past Surgical History:  Procedure Laterality Date  . BILATERAL CARPAL TUNNEL RELEASE Bilateral   .  CORONARY ANGIOPLASTY WITH STENT PLACEMENT  10/11/2016  . CORONARY STENT INTERVENTION N/A 10/11/2016   Procedure: CORONARY STENT INTERVENTION;  Surgeon: Runell Gess, MD;  [facility];  Service: Cardiovascular;  Laterality: N/A;  prox RCA  . KNEE ARTHROSCOPY Left 1978  . KNEE CARTILAGE SURGERY Left   . LEFT HEART CATH AND CORONARY ANGIOGRAPHY N/A 10/11/2016   Procedure: LEFT HEART CATH AND CORONARY ANGIOGRAPHY;  Surgeon: Runell Gess, MD;  Location: [facility];  Service: Cardiovascular;  Laterality:  N/A;    FAMHx:  Family History  Problem Relation Age of Onset  . Brain cancer Father   . Alcoholism Mother   . Diabetes Mother        father   . Lung cancer Mother   . Cancer Unknown   . Cancer - Prostate Brother     SOCHx:   reports that he quit smoking about 12 months ago. His smoking use included cigarettes. He has a 14.52 pack-year smoking history. He has never used smokeless tobacco. He reports that he drinks about 6.0 standard drinks of alcohol per week. He reports that he does not use drugs.  ALLERGIES:  Allergies  Allergen Reactions  . Azithromycin Other (See Comments)    Tingling/swelling in lips/tongue  . E-Mycin [Erythromycin] Nausea Only    GI upset     ROS: Pertinent items noted in HPI and remainder of comprehensive ROS otherwise negative.  HOME MEDS: Current Outpatient Medications on File Prior to Visit  Medication Sig Dispense Refill  . acetaminophen (TYLENOL) 325 MG tablet Take 650 mg by mouth every 6 (six) hours as needed.    Marland Kitchen albuterol (PROVENTIL HFA;VENTOLIN HFA) 108 (90 Base) MCG/ACT inhaler Inhale 2 puffs into the lungs every 6 (six) hours as needed for wheezing or shortness of breath. 1 Inhaler 0  . albuterol (PROVENTIL) (2.5 MG/3ML) 0.083% nebulizer solution Take 3 mLs (2.5 mg total) by nebulization every 6 (six) hours as needed for wheezing or shortness of breath. 75 mL 12  . aspirin EC 81 MG tablet Take 1 tablet (81 mg total) by mouth daily. (Patient taking differently: Take 81 mg by mouth every evening. )    . atorvastatin (LIPITOR) 80 MG tablet Take 1 tablet (80 mg total) by mouth at bedtime. 90 tablet 3  . Diclofenac Sodium 2 % SOLN Place 2 sprays onto the skin 2 (two) times daily. 1 Bottle 11  . fluticasone (FLONASE) 50 MCG/ACT nasal spray One spray in each nostril twice a day, use left hand for right nostril, and right hand for left nostril. 48 g 3  . metoprolol succinate (TOPROL-XL) 25 MG 24 hr tablet Take 1 tablet (25 mg total) by mouth daily.  NEED OV. 90 tablet 0  . montelukast (SINGULAIR) 10 MG tablet Take 1 tablet (10 mg total) by mouth at bedtime. 90 tablet 3  . nitroGLYCERIN (NITROSTAT) 0.4 MG SL tablet DISSOLVE 1 TAB UNDER TONGUE FOR CHEST PAIN - IF PAIN REMAINS AFTER 5 MIN, CALL 911 AND REPEAT DOSE. MAX 3 TABS IN 15 MINUTES 25 tablet 0  . SUMAtriptan (IMITREX) 100 MG tablet 1 tab as needed for migraine, may repeat x1 after 2 hours if needed 18 tablet 11  . ticagrelor (BRILINTA) 90 MG TABS tablet Take 1 tablet (90 mg total) by mouth 2 (two) times daily. 60 tablet 11  . traMADol (ULTRAM) 50 MG tablet Take 1 tablet (50 mg total) by mouth 2 (two) times daily as needed. 60 tablet  2   No current facility-administered medications on file prior to visit.     LABS/IMAGING: No results found for this or any previous visit (from the past 48 hour(s)). No results found.  LIPID PANEL:    Component Value Date/Time   CHOL 130 12/28/2016 0825   TRIG 72 12/28/2016 0825   HDL 63 12/28/2016 0825   CHOLHDL 2.1 12/28/2016 0825   VLDL 36 (H) 07/06/2015 0939   LDLCALC 52 12/28/2016 0825    WEIGHTS: Wt Readings from Last 3 Encounters:  10/14/17 201 lb (91.2 kg)  10/02/17 204 lb (92.5 kg)  06/13/17 206 lb (93.4 kg)    VITALS: BP 120/74 (BP Location: Left Arm, Patient Position: Sitting, Cuff Size: Normal)   Pulse 67   Ht 5\' 11"  (1.803 m)   Wt 201 lb (91.2 kg)   BMI 28.03 kg/m   EXAM: General appearance: alert and no distress Neck: no carotid bruit, no JVD and thyroid not enlarged, symmetric, no tenderness/mass/nodules Lungs: clear to auscultation bilaterally Heart: regular rate and rhythm, S1, S2 normal, no murmur, click, rub or gallop Abdomen: soft, non-tender; bowel sounds normal; no masses,  no organomegaly Extremities: extremities normal, atraumatic, no cyanosis or edema Pulses: 2+ and symmetric Skin: Skin color, texture, turgor normal. No rashes or lesions Neurologic: Grossly normal Psych: Pleasant  EKG: Normal sinus  rhythm 67-personally reviewed  ASSESSMENT: 1. Unstable angina-status post PCI to the RCA with DES (09/2016) 2. Tobacco abuse -quit 3. Calcification of the LAD 4. Dyslipidemia  PLAN: 1.   Mr. Sagona has done well after PCI to the RCA with drug-eluting stent in September 2018.  He denies any further chest pain.  He remains quit from tobacco use.  His cholesterol is well controlled.  At this point he can discontinue Brilinta and continue on monotherapy aspirin.  No further medication changes were made today.  Plan follow-up with me annually or sooner as necessary.  Chrystie Nose, MD, [facility], FACP  [place]  [facility]  Medical Director of the [facility] Lipid Disorders &  Cardiovascular Risk Reduction Clinic Attending Cardiologist  Direct Dial: [phone]  Fax: [phone]  Website:  [email or web address] [email or web address] Costabile 10/14/2017, 2:51 PM

## 2017-10-14 NOTE — Patient Instructions (Addendum)
Your physician has recommended you make the following change in your medication: STOP Brilinta  Your physician recommends that you return for lab work FASTING to check cholesterol   Your physician wants you to follow-up in: ONE YEAR with Dr. Rennis GoldenHilty. You will receive a reminder email via MyChart around July 2020. Please call our office to schedule an appointment once you receive the email, at your convenience.

## 2017-10-15 ENCOUNTER — Encounter: Payer: Self-pay | Admitting: Internal Medicine

## 2017-11-06 ENCOUNTER — Ambulatory Visit: Payer: BC Managed Care – PPO | Admitting: Cardiology

## 2017-11-28 ENCOUNTER — Telehealth: Payer: Self-pay | Admitting: Internal Medicine

## 2017-11-28 NOTE — Telephone Encounter (Signed)
   Primary Cardiologist: Chrystie Nose, MD  Chart reviewed as part of pre-operative protocol coverage. Left voice mail to call back between preop hours.   [place], [place] 11/28/2017, 2:13 PM

## 2017-11-28 NOTE — Telephone Encounter (Signed)
   [facility] Pre-operative Risk Assessment    Request for surgical clearance:  1. What type of surgery is being performed? Right foot: right 2nd TMT joint arthrodesis   2. When is this surgery scheduled? TBD   3. What type of clearance is required (medical clearance vs. Pharmacy clearance to hold med vs. Both)? Medical   4. Are there any medications that need to be held prior to surgery and how long? None specified    5. Practice name and name of physician performing surgery? Dr. Wylene Simmer @ [facility]   6. What is your office phone number [phone]     7.   What is your office fax number [phone] (Attn: Glendale Chard)  8.   Anesthesia type (None, local, MAC, general) ? General    Sheral Apley M 11/28/2017, 2:01 PM  _________________________________________________________________   (provider comments below)

## 2017-12-04 ENCOUNTER — Ambulatory Visit (INDEPENDENT_AMBULATORY_CARE_PROVIDER_SITE_OTHER): Payer: BC Managed Care – PPO | Admitting: Sports Medicine

## 2017-12-04 ENCOUNTER — Encounter: Payer: Self-pay | Admitting: Sports Medicine

## 2017-12-04 ENCOUNTER — Other Ambulatory Visit: Payer: Self-pay | Admitting: Internal Medicine

## 2017-12-04 DIAGNOSIS — I2584 Coronary atherosclerosis due to calcified coronary lesion: Principal | ICD-10-CM

## 2017-12-04 DIAGNOSIS — M5136 Other intervertebral disc degeneration, lumbar region: Secondary | ICD-10-CM

## 2017-12-04 DIAGNOSIS — R072 Precordial pain: Secondary | ICD-10-CM

## 2017-12-04 DIAGNOSIS — M19071 Primary osteoarthritis, right ankle and foot: Secondary | ICD-10-CM

## 2017-12-04 DIAGNOSIS — M51369 Other intervertebral disc degeneration, lumbar region without mention of lumbar back pain or lower extremity pain: Secondary | ICD-10-CM

## 2017-12-04 DIAGNOSIS — I2510 Atherosclerotic heart disease of native coronary artery without angina pectoris: Secondary | ICD-10-CM

## 2017-12-04 DIAGNOSIS — G43809 Other migraine, not intractable, without status migrainosus: Secondary | ICD-10-CM

## 2017-12-04 DIAGNOSIS — M1811 Unilateral primary osteoarthritis of first carpometacarpal joint, right hand: Secondary | ICD-10-CM

## 2017-12-04 MED ORDER — METOPROLOL SUCCINATE ER 25 MG PO TB24: 25.0000 mg | ORAL_TABLET | Freq: Every day | ORAL | 0 refills | Status: DC

## 2017-12-04 NOTE — Assessment & Plan Note (Signed)
Fantastic response left L4-L5 facet joint injection last year, he then had a MI and was on Brilinta, now off of Brilinta so we can go ahead and set him up for another facet joint injection. We will discuss facet radiofrequency ablation afterwards.

## 2017-12-04 NOTE — Assessment & Plan Note (Signed)
Patient can return on Friday for repeat right first [facility] injection.   Previous injection was 2 months ago. I am okay doing an early injection, as long as he understands we will not give him another one for 4 months.

## 2017-12-04 NOTE — Progress Notes (Signed)
Subjective:    CC: Follow-up  HPI: Right foot osteoarthritis: Did see Dr. Victorino Dike, recommended continued conservative measures, his last injection was 2 years ago.  CMC osteoarthritis: Desires repeat injection, previous injection was 2 months ago.  Back pain: Facet arthritis, previous facet injection was a year ago, desires repeat now that he is off of Brilinta.  I reviewed the past medical history, family history, social history, surgical history, and allergies today and no changes were needed.  Please see the problem list section below in epic for further details.  Past Medical History: Past Medical History:  Diagnosis Date  . Anginal pain (HCC)   . Arthritis    "hands, knees, back, rightt foot" (10/11/2016)  . Asthma   . Chronic bronchitis (HCC)    "get it most years" (10/11/2016)  . Chronic lower back pain   . Coronary artery disease   . High cholesterol   . Lumbar degenerative disc disease 12/14/2013  . Migraine    "daily to weekly" (10/11/2016)  . Pneumonia    "several times; last time was in 03/2016" (10/11/2016)  . Sleep apnea   . Stress fracture of foot 12/14/2013   "right"   Past Surgical History: Past Surgical History:  Procedure Laterality Date  . BILATERAL CARPAL TUNNEL RELEASE Bilateral   . CORONARY ANGIOPLASTY WITH STENT PLACEMENT  10/11/2016  . CORONARY STENT INTERVENTION N/A 10/11/2016   Procedure: CORONARY STENT INTERVENTION;  Surgeon: Runell Gess, MD;  Location: [facility];  Service: Cardiovascular;  Laterality: N/A;  prox RCA  . KNEE ARTHROSCOPY Left 1978  . KNEE CARTILAGE SURGERY Left   . LEFT HEART CATH AND CORONARY ANGIOGRAPHY N/A 10/11/2016   Procedure: LEFT HEART CATH AND CORONARY ANGIOGRAPHY;  Surgeon: Runell Gess, MD;  Location: [facility];  Service: Cardiovascular;  Laterality: N/A;   Social History: Social History   Socioeconomic History  . Marital status: Married    Spouse name: [name]  . Number of children: 2  .  Years of education: Masters  . Highest education level: Not on file  Occupational History  . Occupation: [employer]  Social Needs  . Financial resource strain: Not on file  . Food insecurity:    Worry: Not on file    Inability: Not on file  . Transportation needs:    Medical: Not on file    Non-medical: Not on file  Tobacco Use  . Smoking status: Former Smoker    Packs/day: 0.33    Years: 44.00    Pack years: 14.52    Types: Cigarettes    Last attempt to quit: 09/29/2016    Years since quitting: 1.1  . Smokeless tobacco: Never Used  Substance and Sexual Activity  . Alcohol use: Yes    Alcohol/week: 6.0 standard drinks    Types: 3 Glasses of wine, 3 Standard drinks or equivalent per week  . Drug use: No  . Sexual activity: Not Currently    Partners: Female  Lifestyle  . Physical activity:    Days per week: Not on file    Minutes per session: Not on file  . Stress: Not on file  Relationships  . Social connections:    Talks on phone: Not on file    Gets together: Not on file    Attends religious service: Not on file    Active member of club or organization: Not on file    Attends meetings of clubs or organizations: Not on file    Relationship  status: Not on file  Other Topics Concern  . Not on file  Social History Narrative   Lives with wife and son   Caffeine use: Coffee daily   Right handed    Family History: Family History  Problem Relation Age of Onset  . Brain cancer Father   . Alcoholism Mother   . Diabetes Mother        father   . Lung cancer Mother   . Cancer Unknown   . Cancer - Prostate Brother    Allergies: Allergies  Allergen Reactions  . Azithromycin Other (See Comments)    Tingling/swelling in lips/tongue  . E-Mycin [Erythromycin] Nausea Only    GI upset    Medications: See med rec.  Review of Systems: No fevers, chills, night sweats, weight loss, chest pain, or shortness of breath.   Objective:    General: Well Developed, well nourished,  and in no acute distress.  Neuro: Alert and oriented x3, extra-ocular muscles intact, sensation grossly intact.  HEENT: Normocephalic, atraumatic, pupils equal round reactive to light, neck supple, no masses, no lymphadenopathy, thyroid nonpalpable.  Skin: Warm and dry, no rashes. Cardiac: Regular rate and rhythm, no murmurs rubs or gallops, no lower extremity edema.  Respiratory: Clear to auscultation bilaterally. Not using accessory muscles, speaking in full sentences.  Impression and Recommendations:    Lumbar degenerative disc disease Fantastic response left L4-L5 facet joint injection last year, he then had a MI and was on Brilinta, now off of Brilinta so we can go ahead and set him up for another facet joint injection. We will discuss facet radiofrequency ablation afterwards.  Primary osteoarthritis of right foot Did extremely well after first MTP injection in November 2017. He can return on Friday to do this injection, cannot do it later today due to a golf tournament tomorrow. He did see Dr. Victorino Dike who discussed the major surgery involved and he would like to continue with conservative measures.  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   Previous injection was 2 months ago. I am okay doing an early injection, as long as he understands we will not give him another one for 4 months. ___________________________________________ [name]. Joseph Jacobs, M.D., ABFM., CAQSM. Primary Care and Sports Medicine [facility]  Adjunct Professor of Family Medicine  [employer]

## 2017-12-04 NOTE — Assessment & Plan Note (Signed)
Did extremely well after first MTP injection in November 2017. He can return on Friday to do this injection, cannot do it later today due to a golf tournament tomorrow. He did see Dr. Victorino Dike who discussed the major surgery involved and he would like to continue with conservative measures.

## 2017-12-05 NOTE — Telephone Encounter (Signed)
Outpatient Medication Detail    Disp Refills Start End   metoprolol succinate (TOPROL-XL) 25 MG 24 hr tablet 90 tablet 0 12/04/2017    Sig - Route: Take 1 tablet (25 mg total) by mouth daily. NEED OV. - Oral   Sent to pharmacy as: metoprolol succinate (TOPROL-XL) 25 MG 24 hr tablet   E-Prescribing Status: Receipt confirmed by pharmacy (12/04/2017 10:27 AM EST)   Associated Diagnoses   Coronary artery calcification     Precordial chest pain     Other migraine without status migrainosus, not intractable     Pharmacy   [facility] - , [place] - [address]

## 2017-12-06 ENCOUNTER — Ambulatory Visit (INDEPENDENT_AMBULATORY_CARE_PROVIDER_SITE_OTHER): Payer: BC Managed Care – PPO | Admitting: Sports Medicine

## 2017-12-06 DIAGNOSIS — M19071 Primary osteoarthritis, right ankle and foot: Secondary | ICD-10-CM

## 2017-12-06 DIAGNOSIS — M1811 Unilateral primary osteoarthritis of first carpometacarpal joint, right hand: Secondary | ICD-10-CM

## 2017-12-06 NOTE — Telephone Encounter (Signed)
   Primary Cardiologist: Chrystie Nose, MD  Chart reviewed as part of pre-operative protocol coverage. Patient was contacted 12/06/2017 in reference to pre-operative risk assessment for pending surgery as outlined below. Joseph Jacobs was last seen on 10/14/17 by Dr. Rennis Golden.  He has history of CAD s/p PCI to RCA 09/2016, occasional PACs/PVCs, atrial run, tobacco abuse, dyslipidemia. Doing well at that time; Brilinta was stopped. Patient was contacted 11/28/17 by pre-op team but has not returned call yet.  I called patient again and he indicates he actually is no longer planning on having the procedure listed which is why he didn't call back. He does state at some point in the future he will be entertaining the idea of a back injection. Revised cardiac risk index is 0.4% indicating low risk of expected cardiac complications. We discussed cardiac status and he affirms he continues to exercise without any angina, dyspnea or new cardiac impairment. Therefore, based on ACC/AHA guidelines, the patient would be at acceptable risk for having a back injection without further cardiovascular testing.   We do not have any specific clearance for lumbar injection at this time, but I anticipate it will be coming through in the next few weeks. Therefore will route to Dr. Rennis Golden input on holding ASA as the patient anticipates he would be asked to hold. That way it will be available in the chart to expedite rapid clearance when the request comes through.  Laurann Montana, PA-C 12/06/2017, 1:53 PM

## 2017-12-06 NOTE — Progress Notes (Signed)
129/82 71  

## 2017-12-06 NOTE — Assessment & Plan Note (Signed)
Right first [facility] injection. Previous injection was 2-1/2 months ago.

## 2017-12-06 NOTE — Assessment & Plan Note (Signed)
Pain today was not referrable to the MTP or TMT, more to the first intermetatarsal bursa, this was injected today under ultrasound guidance.

## 2017-12-06 NOTE — Progress Notes (Signed)
Subjective:    CC: Hand and foot pain  HPI: This is a pleasant 62 year old male, has pain in his right hand, known CMC arthritis.  Last injection was 2-1/2 months ago.  In addition he has pain in his right foot: Between the first and second metatarsals, not localized over the TMT or the MTP.  Moderate, persistent without radiation.  I reviewed the past medical history, family history, social history, surgical history, and allergies today and no changes were needed.  Please see the problem list section below in epic for further details.  Past Medical History: Past Medical History:  Diagnosis Date  . Anginal pain (HCC)   . Arthritis    "hands, knees, back, rightt foot" (10/11/2016)  . Asthma   . Chronic bronchitis (HCC)    "get it most years" (10/11/2016)  . Chronic lower back pain   . Coronary artery disease   . High cholesterol   . Lumbar degenerative disc disease 12/14/2013  . Migraine    "daily to weekly" (10/11/2016)  . Pneumonia    "several times; last time was in 03/2016" (10/11/2016)  . Sleep apnea   . Stress fracture of foot 12/14/2013   "right"   Past Surgical History: Past Surgical History:  Procedure Laterality Date  . BILATERAL CARPAL TUNNEL RELEASE Bilateral   . CORONARY ANGIOPLASTY WITH STENT PLACEMENT  10/11/2016  . CORONARY STENT INTERVENTION N/A 10/11/2016   Procedure: CORONARY STENT INTERVENTION;  Surgeon: Runell Gess, MD;  [facility];  Service: Cardiovascular;  Laterality: N/A;  prox RCA  . KNEE ARTHROSCOPY Left 1978  . KNEE CARTILAGE SURGERY Left   . LEFT HEART CATH AND CORONARY ANGIOGRAPHY N/A 10/11/2016   Procedure: LEFT HEART CATH AND CORONARY ANGIOGRAPHY;  Surgeon: Runell Gess, MD;  Location: [facility];  Service: Cardiovascular;  Laterality: N/A;   Social History: Social History   Socioeconomic History  . Marital status: Married    Spouse name: [name]  . Number of children: 2  . Years of education: Masters  .  Highest education level: Not on file  Occupational History  . Occupation: [employer]  Social Needs  . Financial resource strain: Not on file  . Food insecurity:    Worry: Not on file    Inability: Not on file  . Transportation needs:    Medical: Not on file    Non-medical: Not on file  Tobacco Use  . Smoking status: Former Smoker    Packs/day: 0.33    Years: 44.00    Pack years: 14.52    Types: Cigarettes    Last attempt to quit: 09/29/2016    Years since quitting: 1.1  . Smokeless tobacco: Never Used  Substance and Sexual Activity  . Alcohol use: Yes    Alcohol/week: 6.0 standard drinks    Types: 3 Glasses of wine, 3 Standard drinks or equivalent per week  . Drug use: No  . Sexual activity: Not Currently    Partners: Female  Lifestyle  . Physical activity:    Days per week: Not on file    Minutes per session: Not on file  . Stress: Not on file  Relationships  . Social connections:    Talks on phone: Not on file    Gets together: Not on file    Attends religious service: Not on file    Active member of club or organization: Not on file    Attends meetings of clubs or organizations: Not on file  Relationship status: Not on file  Other Topics Concern  . Not on file  Social History Narrative   Lives with wife and son   Caffeine use: Coffee daily   Right handed    Family History: Family History  Problem Relation Age of Onset  . Brain cancer Father   . Alcoholism Mother   . Diabetes Mother        father   . Lung cancer Mother   . Cancer Unknown   . Cancer - Prostate Brother    Allergies: Allergies  Allergen Reactions  . Azithromycin Other (See Comments)    Tingling/swelling in lips/tongue  . E-Mycin [Erythromycin] Nausea Only    GI upset    Medications: See med rec.  Review of Systems: No fevers, chills, night sweats, weight loss, chest pain, or shortness of breath.   Objective:    General: Well Developed, well nourished, and in no acute distress.  Neuro:  Alert and oriented x3, extra-ocular muscles intact, sensation grossly intact.  HEENT: Normocephalic, atraumatic, pupils equal round reactive to light, neck supple, no masses, no lymphadenopathy, thyroid nonpalpable.  Skin: Warm and dry, no rashes. Cardiac: Regular rate and rhythm, no murmurs rubs or gallops, no lower extremity edema.  Respiratory: Clear to auscultation bilaterally. Not using accessory muscles, speaking in full sentences.  Procedure: Real-time Ultrasound Guided Injection of right first [facility] Device: GE Logiq E  Verbal informed consent obtained.  Time-out conducted.  Noted no overlying erythema, induration, or other signs of local infection.  Skin prepped in a sterile fashion.  Local anesthesia: Topical Ethyl chloride.  With sterile technique and under real time ultrasound guidance: 1/2 cc kenalog 40, 1/2 cc lidocaine injected easily Completed without difficulty  Pain immediately resolved suggesting accurate placement of the medication.  Advised to call if fevers/chills, erythema, induration, drainage, or persistent bleeding.  Images permanently stored and available for review in the ultrasound unit.  Impression: Technically successful ultrasound guided injection.  Procedure: Real-time Ultrasound Guided Injection of right first intermetatarsal bursa Device: GE Logiq E  Verbal informed consent obtained.  Time-out conducted.  Noted no overlying erythema, induration, or other signs of local infection.  Skin prepped in a sterile fashion.  Local anesthesia: Topical Ethyl chloride.  With sterile technique and under real time ultrasound guidance: 1 cc Kenalog 40, 1 cc lidocaine, 1 cc bupivacaine injected easily Completed without difficulty  Pain immediately resolved suggesting accurate placement of the medication.  Advised to call if fevers/chills, erythema, induration, drainage, or persistent bleeding.  Images permanently stored and available for review in the ultrasound unit.    Impression: Technically successful ultrasound guided injection.  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 Previous injection was 2-1/2 months ago.  Primary osteoarthritis of right foot Pain today was not referrable to the MTP or TMT, more to the first intermetatarsal bursa, this was injected today under ultrasound guidance.  ___________________________________________ [name]. Benjamin Stain, M.D., ABFM., CAQSM. Primary Care and Sports Medicine [facility]  Adjunct Professor of Family Medicine  [employer]

## 2017-12-09 ENCOUNTER — Encounter: Payer: Self-pay | Admitting: Sports Medicine

## 2017-12-09 NOTE — Telephone Encounter (Signed)
Ok to hold ASA 7 days prior to procedure at this point, restart after.   Dr. Rexene Edison

## 2017-12-14 ENCOUNTER — Other Ambulatory Visit: Payer: Self-pay | Admitting: Sports Medicine

## 2017-12-18 NOTE — Telephone Encounter (Signed)
I called Mr. Joseph Jacobs today.  He may need an a back injection.  From a cardiac standpoint he is doing well, he has had no chest pain.  He is cleared from a cardiac standpoint  For spinal injection if needed. Dr. Rennis GoldenHilty has indicated its okay to hold aspirin if needed for this.  We have not received a surgical clearance request but this should be processed promptly upon receipt.   Corine ShelterLUKE Azaliah Carrero PA-C 12/18/2017 1:36 PM

## 2017-12-24 ENCOUNTER — Ambulatory Visit
Admission: RE | Admit: 2017-12-24 | Discharge: 2017-12-24 | Disposition: A | Discharge status: Home / Self Care | Payer: BC Managed Care – PPO | Source: Ambulatory Visit | Attending: Sports Medicine | Admitting: Sports Medicine

## 2017-12-24 MED ORDER — IOPAMIDOL (ISOVUE-M 200) INJECTION 41%
1.0000 mL | Freq: Once | INTRAMUSCULAR | Status: AC
  Administered 2017-12-24: 1 mL via INTRA_ARTICULAR

## 2017-12-24 MED ORDER — METHYLPREDNISOLONE ACETATE 40 MG/ML INJ SUSP (RADIOLOG
120.0000 mg | Freq: Once | INTRAMUSCULAR | Status: AC
  Administered 2017-12-24: 120 mg via INTRA_ARTICULAR

## 2017-12-24 NOTE — Discharge Instructions (Signed)

## 2018-01-14 ENCOUNTER — Encounter: Payer: Self-pay | Admitting: Sports Medicine

## 2018-01-14 ENCOUNTER — Ambulatory Visit: Payer: BC Managed Care – PPO | Admitting: Sports Medicine

## 2018-01-14 VITALS — BP 124/77 | HR 80

## 2018-01-14 DIAGNOSIS — Z125 Encounter for screening for malignant neoplasm of prostate: Secondary | ICD-10-CM | POA: Diagnosis not present

## 2018-01-14 DIAGNOSIS — Z0000 Encounter for general adult medical examination without abnormal findings: Secondary | ICD-10-CM | POA: Diagnosis not present

## 2018-01-14 DIAGNOSIS — I10 Essential (primary) hypertension: Secondary | ICD-10-CM | POA: Diagnosis not present

## 2018-01-14 NOTE — Progress Notes (Signed)
Subjective:    CC: Blood pressure  HPI: Joseph Jacobs returns, he forgot to take his metoprolol for a couple of days, not surprisingly he developed a headache, palpitations, and elevated blood pressure, he has been taking it now and symptoms have improved.  I reviewed the past medical history, family history, social history, surgical history, and allergies today and no changes were needed.  Please see the problem list section below in epic for further details.  Past Medical History: Past Medical History:  Diagnosis Date  . Anginal pain (HCC)   . Arthritis    "hands, knees, back, rightt foot" (10/11/2016)  . Asthma   . Chronic bronchitis (HCC)    "get it most years" (10/11/2016)  . Chronic lower back pain   . Coronary artery disease   . High cholesterol   . Lumbar degenerative disc disease 12/14/2013  . Migraine    "daily to weekly" (10/11/2016)  . Pneumonia    "several times; last time was in 03/2016" (10/11/2016)  . Sleep apnea   . Stress fracture of foot 12/14/2013   "right"   Past Surgical History: Past Surgical History:  Procedure Laterality Date  . BILATERAL CARPAL TUNNEL RELEASE Bilateral   . CORONARY ANGIOPLASTY WITH STENT PLACEMENT  10/11/2016  . CORONARY STENT INTERVENTION N/A 10/11/2016   Procedure: CORONARY STENT INTERVENTION;  Surgeon: Runell Gess, MD;  Location: [facility];  Service: Cardiovascular;  Laterality: N/A;  prox RCA  . KNEE ARTHROSCOPY Left 1978  . KNEE CARTILAGE SURGERY Left   . LEFT HEART CATH AND CORONARY ANGIOGRAPHY N/A 10/11/2016   Procedure: LEFT HEART CATH AND CORONARY ANGIOGRAPHY;  Surgeon: Runell Gess, MD;  [facility]: [facility];  Service: Cardiovascular;  Laterality: N/A;   Social History: Social History   Socioeconomic History  . Marital status: Married    Spouse name: Joseph Jacobs  . Number of children: 2  . Years of education: Masters  . Highest education level: Not on file  Occupational History  . Occupation: [employer]    Social Needs  . Financial resource strain: Not on file  . Food insecurity:    Worry: Not on file    Inability: Not on file  . Transportation needs:    Medical: Not on file    Non-medical: Not on file  Tobacco Use  . Smoking status: Former Smoker    Packs/day: 0.33    Years: 44.00    Pack years: 14.52    Types: Cigarettes    Last attempt to quit: 09/29/2016    Years since quitting: 1.2  . Smokeless tobacco: Never Used  Substance and Sexual Activity  . Alcohol use: Yes    Alcohol/week: 6.0 standard drinks    Types: 3 Glasses of wine, 3 Standard drinks or equivalent per week  . Drug use: No  . Sexual activity: Not Currently    Partners: Female  Lifestyle  . Physical activity:    Days per week: Not on file    Minutes per session: Not on file  . Stress: Not on file  Relationships  . Social connections:    Talks on phone: Not on file    Gets together: Not on file    Attends religious service: Not on file    Active member of club or organization: Not on file    Attends meetings of clubs or organizations: Not on file    Relationship status: Not on file  Other Topics Concern  . Not on file  Social  History Narrative   Lives with wife and son   Caffeine use: Coffee daily   Right handed    Family History: Family History  Problem Relation Age of Onset  . Brain cancer Father   . Alcoholism Mother   . Diabetes Mother        father   . Lung cancer Mother   . Cancer Unknown   . Cancer - Prostate Brother    Allergies: Allergies  Allergen Reactions  . Azithromycin Other (See Comments)    Tingling/swelling in lips/tongue  . E-Mycin [Erythromycin] Nausea Only    GI upset    Medications: See med rec.  Review of Systems: No fevers, chills, night sweats, weight loss, chest pain, or shortness of breath.   Objective:    General: Well Developed, well nourished, and in no acute distress.  Neuro: Alert and oriented x3, extra-ocular muscles intact, sensation grossly intact.   HEENT: Normocephalic, atraumatic, pupils equal round reactive to light, neck supple, no masses, no lymphadenopathy, thyroid nonpalpable.  Skin: Warm and dry, no rashes. Cardiac: Regular rate and rhythm, no murmurs rubs or gallops, no lower extremity edema.  Respiratory: Clear to auscultation bilaterally. Not using accessory muscles, speaking in full sentences.  Impression and Recommendations:    Benign essential hypertension Flushing, headache, tachycardia after forgetting his metoprolol for a couple of days. He has since restarted, symptoms have resolved, blood pressure is normal. He will consider getting a morning/evening pillbox to help him remember to do his medications.  ___________________________________________ Joseph Jacobs, M.D., ABFM., CAQSM. Primary Care and Sports Medicine [facility]  Adjunct Professor of Family Medicine  [employer]

## 2018-01-14 NOTE — Assessment & Plan Note (Signed)
Flushing, headache, tachycardia after forgetting his metoprolol for a couple of days. He has since restarted, symptoms have resolved, blood pressure is normal. He will consider getting a morning/evening pillbox to help him remember to do his medications.

## 2018-01-16 ENCOUNTER — Encounter: Payer: Self-pay | Admitting: Sports Medicine

## 2018-01-16 DIAGNOSIS — E119 Type 2 diabetes mellitus without complications: Secondary | ICD-10-CM | POA: Insufficient documentation

## 2018-01-16 DIAGNOSIS — E1159 Type 2 diabetes mellitus with other circulatory complications: Secondary | ICD-10-CM | POA: Insufficient documentation

## 2018-01-16 LAB — PSA, TOTAL AND FREE
PSA, % Free: 27 % (ref >25)
PSA, Free: 0.3 ng/mL
PSA, Total: 1.1 ng/mL (ref <=4.0)

## 2018-01-16 LAB — CBC
HCT: 43.5 % (ref 38.5–50.0)
Hemoglobin: 14.7 g/dL (ref 13.2–17.1)
MCH: 31.5 pg (ref 27.0–33.0)
MCHC: 33.8 g/dL (ref 32.0–36.0)
MCV: 93.1 fL (ref 80.0–100.0)
MPV: 11.3 fL (ref 7.5–12.5)
Platelets: 276 Thousand/uL (ref 140–400)
RBC: 4.67 Million/uL (ref 4.20–5.80)
RDW: 12.4 % (ref 11.0–15.0)
WBC: 5.6 Thousand/uL (ref 3.8–10.8)

## 2018-01-16 LAB — LIPID PANEL W/REFLEX DIRECT LDL
Cholesterol: 162 mg/dL (ref <200)
HDL: 55 mg/dL (ref >40)
LDL Cholesterol (Calc): 82 mg/dL (calc)
Non-HDL Cholesterol (Calc): 107 mg/dL (calc) (ref <130)
Total CHOL/HDL Ratio: 2.9 (calc) (ref <5.0)
Triglycerides: 148 mg/dL (ref <150)

## 2018-01-16 LAB — COMPREHENSIVE METABOLIC PANEL WITH GFR
AG Ratio: 2.1 (calc) (ref 1.0–2.5)
Alkaline phosphatase (APISO): 67 U/L (ref 40–115)
BUN: 12 mg/dL (ref 7–25)
CO2: 31 mmol/L (ref 20–32)
Calcium: 9.4 mg/dL (ref 8.6–10.3)
Chloride: 104 mmol/L (ref 98–110)
Globulin: 2 g/dL (ref 1.9–3.7)
Total Bilirubin: 0.5 mg/dL (ref 0.2–1.2)

## 2018-01-16 LAB — COMPREHENSIVE METABOLIC PANEL
ALT: 23 U/L (ref 9–46)
AST: 16 U/L (ref 10–35)
Albumin: 4.2 g/dL (ref 3.6–5.1)
Creat: 1.01 mg/dL (ref 0.70–1.25)
Glucose, Bld: 119 mg/dL — ABNORMAL HIGH (ref 65–99)
Potassium: 4.5 mmol/L (ref 3.5–5.3)
Sodium: 143 mmol/L (ref 135–146)
Total Protein: 6.2 g/dL (ref 6.1–8.1)

## 2018-01-16 LAB — HEMOGLOBIN A1C
Hgb A1c MFr Bld: 7.4 %{Hb} — ABNORMAL HIGH (ref <5.7)
Mean Plasma Glucose: 166 (calc)
eAG (mmol/L): 9.2 (calc)

## 2018-01-16 LAB — TSH: TSH: 1.83 m[IU]/L (ref 0.40–4.50)

## 2018-01-17 ENCOUNTER — Encounter: Payer: Self-pay | Admitting: Sports Medicine

## 2018-01-31 ENCOUNTER — Encounter: Payer: Self-pay | Admitting: Sports Medicine

## 2018-01-31 ENCOUNTER — Ambulatory Visit (INDEPENDENT_AMBULATORY_CARE_PROVIDER_SITE_OTHER): Payer: BC Managed Care – PPO | Admitting: Sports Medicine

## 2018-01-31 DIAGNOSIS — G43809 Other migraine, not intractable, without status migrainosus: Secondary | ICD-10-CM

## 2018-01-31 DIAGNOSIS — E119 Type 2 diabetes mellitus without complications: Secondary | ICD-10-CM | POA: Diagnosis not present

## 2018-01-31 DIAGNOSIS — J449 Chronic obstructive pulmonary disease, unspecified: Secondary | ICD-10-CM | POA: Diagnosis not present

## 2018-01-31 MED ORDER — SEMAGLUTIDE (1 MG/DOSE) 2 MG/1.5ML ~~LOC~~ SOPN: 1.0000 mg | PEN_INJECTOR | SUBCUTANEOUS | 11 refills | Status: DC

## 2018-01-31 MED ORDER — METFORMIN HCL ER 500 MG PO TB24: 500.0000 mg | ORAL_TABLET | Freq: Every day | ORAL | 11 refills | Status: DC

## 2018-01-31 MED ORDER — SUMATRIPTAN-NAPROXEN SODIUM 85-500 MG PO TABS: ORAL_TABLET | ORAL | 0 refills | Status: DC

## 2018-01-31 MED ORDER — ALBUTEROL SULFATE HFA 108 (90 BASE) MCG/ACT IN AERS: 2.0000 | INHALATION_SPRAY | Freq: Four times a day (QID) | RESPIRATORY_TRACT | 0 refills | Status: DC | PRN

## 2018-01-31 MED ORDER — SEMAGLUTIDE(0.25 OR 0.5MG/DOS) 2 MG/1.5ML ~~LOC~~ SOPN: 0.5000 mg | PEN_INJECTOR | SUBCUTANEOUS | 11 refills | Status: DC

## 2018-01-31 NOTE — Assessment & Plan Note (Signed)
Starting metformin, Ozempic. Return in a nurse visit to learn injections. Return to see me in 1 month for repeat A1c. We will discuss further diabetic screenings at follow-up visits.

## 2018-01-31 NOTE — Assessment & Plan Note (Signed)
Switching to Treximet.

## 2018-01-31 NOTE — Progress Notes (Signed)
Subjective:    CC: Discuss diabetes  HPI: This is a pleasant 63 year old male, we recently diagnosed diabetes, he is here to go over the results and discuss a treatment plan.  I reviewed the past medical history, family history, social history, surgical history, and allergies today and no changes were needed.  Please see the problem list section below in epic for further details.  Past Medical History: Past Medical History:  Diagnosis Date  . Anginal pain (HCC)   . Arthritis    "hands, knees, back, rightt foot" (10/11/2016)  . Asthma   . Chronic bronchitis (HCC)    "get it most years" (10/11/2016)  . Chronic lower back pain   . Coronary artery disease   . High cholesterol   . Lumbar degenerative disc disease 12/14/2013  . Migraine    "daily to weekly" (10/11/2016)  . Pneumonia    "several times; last time was in 03/2016" (10/11/2016)  . Sleep apnea   . Stress fracture of foot 12/14/2013   "right"   Past Surgical History: Past Surgical History:  Procedure Laterality Date  . BILATERAL CARPAL TUNNEL RELEASE Bilateral   . CORONARY ANGIOPLASTY WITH STENT PLACEMENT  10/11/2016  . CORONARY STENT INTERVENTION N/A 10/11/2016   Procedure: CORONARY STENT INTERVENTION;  Surgeon: Runell GessBerry, Jonathan J, MD;  Location: [facility];  Service: Cardiovascular;  Laterality: N/A;  prox RCA  . KNEE ARTHROSCOPY Left 1978  . KNEE CARTILAGE SURGERY Left   . LEFT HEART CATH AND CORONARY ANGIOGRAPHY N/A 10/11/2016   Procedure: LEFT HEART CATH AND CORONARY ANGIOGRAPHY;  Surgeon: Runell GessBerry, Jonathan J, MD;  Location: [facility];  Service: Cardiovascular;  Laterality: N/A;   Social History: Social History   Socioeconomic History  . Marital status: Married    Spouse name: Gunnar Fusiaula  . Number of children: 2  . Years of education: Masters  . Highest education level: Not on file  Occupational History  . Occupation: [employer]  Social Needs  . Financial resource strain: Not on file  . Food insecurity:      Worry: Not on file    Inability: Not on file  . Transportation needs:    Medical: Not on file    Non-medical: Not on file  Tobacco Use  . Smoking status: Former Smoker    Packs/day: 0.33    Years: 44.00    Pack years: 14.52    Types: Cigarettes    Last attempt to quit: 09/29/2016    Years since quitting: 1.3  . Smokeless tobacco: Never Used  Substance and Sexual Activity  . Alcohol use: Yes    Alcohol/week: 6.0 standard drinks    Types: 3 Glasses of wine, 3 Standard drinks or equivalent per week  . Drug use: No  . Sexual activity: Not Currently    Partners: Female  Lifestyle  . Physical activity:    Days per week: Not on file    Minutes per session: Not on file  . Stress: Not on file  Relationships  . Social connections:    Talks on phone: Not on file    Gets together: Not on file    Attends religious service: Not on file    Active member of club or organization: Not on file    Attends meetings of clubs or organizations: Not on file    Relationship status: Not on file  Other Topics Concern  . Not on file  Social History Narrative   Lives with wife and son  Caffeine use: Coffee daily   Right handed    Family History: Family History  Problem Relation Age of Onset  . Brain cancer Father   . Alcoholism Mother   . Diabetes Mother        father   . Lung cancer Mother   . Cancer Unknown   . Cancer - Prostate Brother    Allergies: Allergies  Allergen Reactions  . Azithromycin Other (See Comments)    Tingling/swelling in lips/tongue  . E-Mycin [Erythromycin] Nausea Only    GI upset    Medications: See med rec.  Review of Systems: No fevers, chills, night sweats, weight loss, chest pain, or shortness of breath.   Objective:    General: Well Developed, well nourished, and in no acute distress.  Neuro: Alert and oriented x3, extra-ocular muscles intact, sensation grossly intact.  HEENT: Normocephalic, atraumatic, pupils equal round reactive to light, neck  supple, no masses, no lymphadenopathy, thyroid nonpalpable.  Skin: Warm and dry, no rashes. Cardiac: Regular rate and rhythm, no murmurs rubs or gallops, no lower extremity edema.  Respiratory: Clear to auscultation bilaterally. Not using accessory muscles, speaking in full sentences.  Impression and Recommendations:    Controlled type 2 diabetes mellitus without complication, without long-term current use of insulin (HCC) Starting metformin, Ozempic. Return in a nurse visit to learn injections. Return to see me in 1 month for repeat A1c. We will discuss further diabetic screenings at follow-up visits.  Migraine Switching to Treximet.  I spent 40 minutes with this patient, greater than 50% was face-to-face time counseling regarding the above diagnoses, we spent a great deal of time discussing the pathophysiology of diabetes as well as the available treatment options. ___________________________________________ Ihor Austinhomas J. Benjamin Stainhekkekandam, M.D., ABFM., CAQSM. Primary Care and Sports Medicine [facility]  Adjunct Professor of Family Medicine  [employer]

## 2018-02-03 ENCOUNTER — Encounter: Payer: Self-pay | Admitting: Sports Medicine

## 2018-02-20 ENCOUNTER — Other Ambulatory Visit: Payer: Self-pay | Admitting: Sports Medicine

## 2018-02-20 DIAGNOSIS — I2510 Atherosclerotic heart disease of native coronary artery without angina pectoris: Secondary | ICD-10-CM

## 2018-02-20 DIAGNOSIS — I2584 Coronary atherosclerosis due to calcified coronary lesion: Principal | ICD-10-CM

## 2018-02-20 DIAGNOSIS — R072 Precordial pain: Secondary | ICD-10-CM

## 2018-02-20 DIAGNOSIS — G43809 Other migraine, not intractable, without status migrainosus: Secondary | ICD-10-CM

## 2018-03-04 ENCOUNTER — Encounter: Payer: Self-pay | Admitting: Sports Medicine

## 2018-03-04 MED ORDER — AMBULATORY NON FORMULARY MEDICATION: 0 refills | Status: AC

## 2018-03-15 ENCOUNTER — Other Ambulatory Visit: Payer: Self-pay | Admitting: Sports Medicine

## 2018-05-02 ENCOUNTER — Ambulatory Visit: Payer: BC Managed Care – PPO | Admitting: Sports Medicine

## 2018-05-02 ENCOUNTER — Other Ambulatory Visit: Payer: Self-pay

## 2018-05-02 VITALS — BP 107/72 | HR 91 | Ht 67.0 in | Wt 196.0 lb

## 2018-05-02 DIAGNOSIS — M1811 Unilateral primary osteoarthritis of first carpometacarpal joint, right hand: Secondary | ICD-10-CM | POA: Diagnosis not present

## 2018-05-02 DIAGNOSIS — M19071 Primary osteoarthritis, right ankle and foot: Secondary | ICD-10-CM | POA: Diagnosis not present

## 2018-05-02 DIAGNOSIS — E119 Type 2 diabetes mellitus without complications: Secondary | ICD-10-CM | POA: Diagnosis not present

## 2018-05-02 LAB — POCT GLYCOSYLATED HEMOGLOBIN (HGB A1C): Hemoglobin A1C: 6 % — AB (ref 4.0–5.6)

## 2018-05-02 LAB — POCT UA - MICROALBUMIN
Albumin/Creatinine Ratio, Urine, POC: <30
Creatinine, POC: 300 mg/dL
Microalbumin Ur, POC: 30 mg/L

## 2018-05-02 NOTE — Progress Notes (Signed)
Subjective:    CC: Follow-up  HPI: Diabetes mellitus type 2: Due to recheck hemoglobin A1c.  Doing well with Ozempic.  Right first CMC pain: Previous injection was 4 months ago, desires repeat injection today.  Pain is moderate, persistent, localized without radiation.  Right foot pain: First intermetatarsal bursitis, previous injection was in November, desires repeat injection.  I reviewed the past medical history, family history, social history, surgical history, and allergies today and no changes were needed.  Please see the problem list section below in epic for further details.  Past Medical History: Past Medical History:  Diagnosis Date  . Anginal pain (HCC)   . Arthritis    "hands, knees, back, rightt foot" (10/11/2016)  . Asthma   . Chronic bronchitis (HCC)    "get it most years" (10/11/2016)  . Chronic lower back pain   . Coronary artery disease   . High cholesterol   . Lumbar degenerative disc disease 12/14/2013  . Migraine    "daily to weekly" (10/11/2016)  . Pneumonia    "several times; last time was in 03/2016" (10/11/2016)  . Sleep apnea   . Stress fracture of foot 12/14/2013   "right"   Past Surgical History: Past Surgical History:  Procedure Laterality Date  . BILATERAL CARPAL TUNNEL RELEASE Bilateral   . CORONARY ANGIOPLASTY WITH STENT PLACEMENT  10/11/2016  . CORONARY STENT INTERVENTION N/A 10/11/2016   Procedure: CORONARY STENT INTERVENTION;  Surgeon: Runell Gess, MD;  [facility];  Service: Cardiovascular;  Laterality: N/A;  prox RCA  . KNEE ARTHROSCOPY Left 1978  . KNEE CARTILAGE SURGERY Left   . LEFT HEART CATH AND CORONARY ANGIOGRAPHY N/A 10/11/2016   Procedure: LEFT HEART CATH AND CORONARY ANGIOGRAPHY;  Surgeon: Runell Gess, MD;  Location: [facility];  Service: Cardiovascular;  Laterality: N/A;   Social History: Social History   Socioeconomic History  . Marital status: Married    Spouse name: [name]  . Number  of children: 2  . Years of education: Masters  . Highest education level: Not on file  Occupational History  . Occupation: [employer]  Social Needs  . Financial resource strain: Not on file  . Food insecurity:    Worry: Not on file    Inability: Not on file  . Transportation needs:    Medical: Not on file    Non-medical: Not on file  Tobacco Use  . Smoking status: Former Smoker    Packs/day: 0.33    Years: 44.00    Pack years: 14.52    Types: Cigarettes    Last attempt to quit: 09/29/2016    Years since quitting: 1.5  . Smokeless tobacco: Never Used  Substance and Sexual Activity  . Alcohol use: Yes    Alcohol/week: 6.0 standard drinks    Types: 3 Glasses of wine, 3 Standard drinks or equivalent per week  . Drug use: No  . Sexual activity: Not Currently    Partners: Female  Lifestyle  . Physical activity:    Days per week: Not on file    Minutes per session: Not on file  . Stress: Not on file  Relationships  . Social connections:    Talks on phone: Not on file    Gets together: Not on file    Attends religious service: Not on file    Active member of club or organization: Not on file    Attends meetings of clubs or organizations: Not on file  Relationship status: Not on file  Other Topics Concern  . Not on file  Social History Narrative   Lives with wife and son   Caffeine use: Coffee daily   Right handed    Family History: Family History  Problem Relation Age of Onset  . Brain cancer Father   . Alcoholism Mother   . Diabetes Mother        father   . Lung cancer Mother   . Cancer Unknown   . Cancer - Prostate Brother    Allergies: Allergies  Allergen Reactions  . Azithromycin Other (See Comments)    Tingling/swelling in lips/tongue  . E-Mycin [Erythromycin] Nausea Only    GI upset    Medications: See med rec.  Review of Systems: No fevers, chills, night sweats, weight loss, chest pain, or shortness of breath.   Objective:    General: Well Developed,  well nourished, and in no acute distress.  Neuro: Alert and oriented x3, extra-ocular muscles intact, sensation grossly intact.  HEENT: Normocephalic, atraumatic, pupils equal round reactive to light, neck supple, no masses, no lymphadenopathy, thyroid nonpalpable.  Skin: Warm and dry, no rashes. Cardiac: Regular rate and rhythm, no murmurs rubs or gallops, no lower extremity edema.  Respiratory: Clear to auscultation bilaterally. Not using accessory muscles, speaking in full sentences. Right wrist: Inspection normal with no visible erythema or swelling. ROM smooth and normal with good flexion and extension and ulnar/radial deviation that is symmetrical with opposite wrist. Palpation is normal over metacarpals, navicular, lunate, and TFCC; tendons without tenderness/ swelling No snuffbox tenderness. No tenderness over Canal of Guyon. Strength 5/5 in all directions without pain. Negative tinel's and phalens signs. Negative Finkelstein sign. Negative Watson's test. Tender to palpation at the first [facility] Right foot: No visible erythema or swelling. Range of motion is full in all directions. Strength is 5/5 in all directions. No hallux valgus. No pes cavus or pes planus. No abnormal callus noted. No pain over the navicular prominence, or base of fifth metatarsal. No tenderness to palpation of the calcaneal insertion of plantar fascia. No pain at the Achilles insertion. No pain over the calcaneal bursa. No pain of the retrocalcaneal bursa. No tenderness to palpation over the tarsals, metatarsals, or phalanges. No hallux rigidus or limitus. No tenderness palpation over interphalangeal joints. Pain with compression of the first and second metatarsal heads with pain at the intermetatarsal bursa. Neurovascularly intact distally.  Diabetic Foot Exam Both feet were examined, there are no signs of ulceration or abnormal callus. Nails are unremarkable. Dorsalis pedis and posterior tibial  pulses are palpable. Sensation is intact to sharp and monofilament. Shoes are of appropriate fitment.  Procedure: Real-time Ultrasound Guided injection of the right first carpometacarpal joint Device: GE Logiq E  Verbal informed consent obtained.  Time-out conducted.  Noted no overlying erythema, induration, or other signs of local infection.  Skin prepped in a sterile fashion.  Local anesthesia: Topical Ethyl chloride.  With sterile technique and under real time ultrasound guidance:  25-gauge needle advanced between the trapezium and the base of the first metacarpal, I then injected 1/2 cc Kenalog 40, 1/2 cc lidocaine. Completed without difficulty  Pain immediately resolved suggesting accurate placement of the medication.  Advised to call if fevers/chills, erythema, induration, drainage, or persistent bleeding.  Images permanently stored and available for review in the ultrasound unit.  Impression: Technically successful ultrasound guided injection.  Procedure: Real-time Ultrasound Guided injection of the right first intermetatarsal bursa Device: GE Logiq E  Verbal informed consent obtained.  Time-out conducted.  Noted no overlying erythema, induration, or other signs of local infection.  Skin prepped in a sterile fashion.  Local anesthesia: Topical Ethyl chloride.  With sterile technique and under real time ultrasound guidance:  25-gauge needle advanced between the first and second metatarsal heads, then I injected 1 cc lidocaine, 1/2 cc Kenalog 40 in a fanlike pattern. Completed without difficulty  Pain immediately resolved suggesting accurate placement of the medication.  Advised to call if fevers/chills, erythema, induration, drainage, or persistent bleeding.  Images permanently stored and available for review in the ultrasound unit.  Impression: Technically successful ultrasound guided injection.  Impression and Recommendations:    Primary osteoarthritis of right foot Pain  referrable today to the first intermetatarsal bursa, injected under guidance, this was last done in November.   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  Controlled type 2 diabetes mellitus without complication, without long-term current use of insulin (HCC) Well-controlled on metformin and Ozempic 0.5 mg. A1c dropped from 7.6% down to 6% today. Normal urine microalbumin. Return in 3 months.   ___________________________________________ [name]. Benjamin Stain, M.D., ABFM., CAQSM. Primary Care and Sports Medicine [facility]  Adjunct Professor of Family Medicine  [employer]

## 2018-05-02 NOTE — Assessment & Plan Note (Signed)
Well-controlled on metformin and Ozempic 0.5 mg. A1c dropped from 7.6% down to 6% today. Normal urine microalbumin. Return in 3 months.

## 2018-05-02 NOTE — Assessment & Plan Note (Signed)
Right first CMC injection today, previous injection was in November 2019.

## 2018-05-02 NOTE — Assessment & Plan Note (Signed)
Pain referrable today to the first intermetatarsal bursa, injected under guidance, this was last done in November.

## 2018-05-08 ENCOUNTER — Other Ambulatory Visit: Payer: Self-pay | Admitting: Sports Medicine

## 2018-05-08 DIAGNOSIS — J449 Chronic obstructive pulmonary disease, unspecified: Secondary | ICD-10-CM

## 2018-05-16 ENCOUNTER — Encounter: Payer: Self-pay | Admitting: Sports Medicine

## 2018-05-23 ENCOUNTER — Encounter: Payer: Self-pay | Admitting: Sports Medicine

## 2018-05-23 ENCOUNTER — Ambulatory Visit (INDEPENDENT_AMBULATORY_CARE_PROVIDER_SITE_OTHER): Payer: BC Managed Care – PPO | Admitting: Sports Medicine

## 2018-05-23 DIAGNOSIS — M5136 Other intervertebral disc degeneration, lumbar region: Secondary | ICD-10-CM

## 2018-05-23 DIAGNOSIS — M51369 Other intervertebral disc degeneration, lumbar region without mention of lumbar back pain or lower extremity pain: Secondary | ICD-10-CM

## 2018-05-23 NOTE — Progress Notes (Signed)
Subjective:    CC: Follow-up  HPI: Lumbar DDD, metatarsal stress injuries, needs FMLA paperwork filled out today.  Overall doing well.  I reviewed the past medical history, family history, social history, surgical history, and allergies today and no changes were needed.  Please see the problem list section below in epic for further details.  Past Medical History: Past Medical History:  Diagnosis Date  . Anginal pain (HCC)   . Arthritis    "hands, knees, back, rightt foot" (10/11/2016)  . Asthma   . Chronic bronchitis (HCC)    "get it most years" (10/11/2016)  . Chronic lower back pain   . Coronary artery disease   . High cholesterol   . Lumbar degenerative disc disease 12/14/2013  . Migraine    "daily to weekly" (10/11/2016)  . Pneumonia    "several times; last time was in 03/2016" (10/11/2016)  . Sleep apnea   . Stress fracture of foot 12/14/2013   "right"   Past Surgical History: Past Surgical History:  Procedure Laterality Date  . BILATERAL CARPAL TUNNEL RELEASE Bilateral   . CORONARY ANGIOPLASTY WITH STENT PLACEMENT  10/11/2016  . CORONARY STENT INTERVENTION N/A 10/11/2016   Procedure: CORONARY STENT INTERVENTION;  Surgeon: Runell Gess, MD;  Location: [facility];  Service: Cardiovascular;  Laterality: N/A;  prox RCA  . KNEE ARTHROSCOPY Left 1978  . KNEE CARTILAGE SURGERY Left   . LEFT HEART CATH AND CORONARY ANGIOGRAPHY N/A 10/11/2016   Procedure: LEFT HEART CATH AND CORONARY ANGIOGRAPHY;  Surgeon: Runell Gess, MD;  Location: [facility];  Service: Cardiovascular;  Laterality: N/A;   Social History: Social History   Socioeconomic History  . Marital status: Married    Spouse name: [name]  . Number of children: 2  . Years of education: Masters  . Highest education level: Not on file  Occupational History  . Occupation: [employer]  Social Needs  . Financial resource strain: Not on file  . Food insecurity:    Worry: Not on file    Inability: Not  on file  . Transportation needs:    Medical: Not on file    Non-medical: Not on file  Tobacco Use  . Smoking status: Former Smoker    Packs/day: 0.33    Years: 44.00    Pack years: 14.52    Types: Cigarettes    Last attempt to quit: 09/29/2016    Years since quitting: 1.6  . Smokeless tobacco: Never Used  Substance and Sexual Activity  . Alcohol use: Yes    Alcohol/week: 6.0 standard drinks    Types: 3 Glasses of wine, 3 Standard drinks or equivalent per week  . Drug use: No  . Sexual activity: Not Currently    Partners: Female  Lifestyle  . Physical activity:    Days per week: Not on file    Minutes per session: Not on file  . Stress: Not on file  Relationships  . Social connections:    Talks on phone: Not on file    Gets together: Not on file    Attends religious service: Not on file    Active member of club or organization: Not on file    Attends meetings of clubs or organizations: Not on file    Relationship status: Not on file  Other Topics Concern  . Not on file  Social History Narrative   Lives with wife and son   Caffeine use: Coffee daily   Right handed  Family History: Family History  Problem Relation Age of Onset  . Brain cancer Father   . Alcoholism Mother   . Diabetes Mother        father   . Lung cancer Mother   . Cancer Unknown   . Cancer - Prostate Brother    Allergies: Allergies  Allergen Reactions  . Azithromycin Other (See Comments)    Tingling/swelling in lips/tongue  . E-Mycin [Erythromycin] Nausea Only    GI upset    Medications: See med rec.  Review of Systems: No fevers, chills, night sweats, weight loss, chest pain, or shortness of breath.   Objective:    General: Well Developed, well nourished, and in no acute distress.  Neuro: Alert and oriented x3, extra-ocular muscles intact, sensation grossly intact.  HEENT: Normocephalic, atraumatic, pupils equal round reactive to light, neck supple, no masses, no lymphadenopathy,  thyroid nonpalpable.  Skin: Warm and dry, no rashes. Cardiac: Regular rate and rhythm, no murmurs rubs or gallops, no lower extremity edema.  Respiratory: Clear to auscultation bilaterally. Not using accessory muscles, speaking in full sentences.  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 After a year he came off of Brilinta and we were able to do another facet joint injection in November 2019. Continues to do well, FMLA paperwork filled out today. We are scanning a copy into the chart that we can use for future reference.   ___________________________________________ Ihor Austinhomas J. Benjamin Stainhekkekandam, M.D., ABFM., CAQSM. Primary Care and Sports Medicine [facility]  Adjunct Professor of Family Medicine  [employer]

## 2018-05-23 NOTE — Assessment & Plan Note (Signed)
Fantastic response to left L4-L5 facet joint injections back in 2018, he then had an MI and was on Brilinta. After a year he came off of Brilinta and we were able to do another facet joint injection in November 2019. Continues to do well, FMLA paperwork filled out today. We are scanning a copy into the chart that we can use for future reference.

## 2018-05-24 ENCOUNTER — Encounter (INDEPENDENT_AMBULATORY_CARE_PROVIDER_SITE_OTHER): Payer: BC Managed Care – PPO | Admitting: Sports Medicine

## 2018-05-24 DIAGNOSIS — R079 Chest pain, unspecified: Secondary | ICD-10-CM

## 2018-05-24 DIAGNOSIS — M5030 Other cervical disc degeneration, unspecified cervical region: Secondary | ICD-10-CM | POA: Diagnosis not present

## 2018-05-26 MED ORDER — PREDNISONE 50 MG PO TABS: ORAL_TABLET | ORAL | 0 refills | Status: DC

## 2018-05-26 MED ORDER — NITROGLYCERIN 0.4 MG SL SUBL: 0.4000 mg | SUBLINGUAL_TABLET | SUBLINGUAL | 0 refills | Status: DC | PRN

## 2018-05-26 NOTE — Addendum Note (Signed)
Addended by: Monica Becton on: 05/26/2018 03:12 PM   Modules accepted: Orders

## 2018-05-26 NOTE — Assessment & Plan Note (Signed)
Adding a burst of prednisone.

## 2018-05-26 NOTE — Telephone Encounter (Signed)
I spent 5 total minutes of online digital evaluation and management services. 

## 2018-06-11 ENCOUNTER — Encounter: Payer: Self-pay | Admitting: Sports Medicine

## 2018-06-13 ENCOUNTER — Encounter: Payer: Self-pay | Admitting: Sports Medicine

## 2018-06-13 ENCOUNTER — Other Ambulatory Visit: Payer: Self-pay

## 2018-06-13 ENCOUNTER — Ambulatory Visit (INDEPENDENT_AMBULATORY_CARE_PROVIDER_SITE_OTHER): Payer: BC Managed Care – PPO | Admitting: Sports Medicine

## 2018-06-13 ENCOUNTER — Ambulatory Visit (INDEPENDENT_AMBULATORY_CARE_PROVIDER_SITE_OTHER): Payer: BC Managed Care – PPO

## 2018-06-13 DIAGNOSIS — M5030 Other cervical disc degeneration, unspecified cervical region: Secondary | ICD-10-CM

## 2018-06-13 MED ORDER — CYCLOBENZAPRINE HCL 10 MG PO TABS: ORAL_TABLET | ORAL | 0 refills | Status: DC

## 2018-06-13 MED ORDER — MELOXICAM 15 MG PO TABS: ORAL_TABLET | ORAL | 3 refills | Status: DC

## 2018-06-13 NOTE — Assessment & Plan Note (Signed)
Left and right periscapular radicular symptoms. Patient declines physical therapy, we are going to try chiropractic manipulation, Flexeril, meloxicam, x-rays. Return in 1 month, MRI for interventional planning if no better, he did respond very well to an epidural in the past.

## 2018-06-13 NOTE — Progress Notes (Addendum)
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  He does have a known history of cervical DDD, has not responded well to physical therapy, he did have an epidural in the distant past that worked very well.  At this point he is agreeable to restart conservative measures, and is agreeable to proceed with chiropractic manipulation.  I reviewed the past medical history, family history, social history, surgical history, and allergies today and no changes were needed.  Please see the problem list section below in epic for further details.  Past Medical History: Past Medical History:  Diagnosis Date  . Anginal pain (HCC)   . Arthritis    "hands, knees, back, rightt foot" (10/11/2016)  . Asthma   . Chronic bronchitis (HCC)    "get it most years" (10/11/2016)  . Chronic lower back pain   . Coronary artery disease   . High cholesterol   . Lumbar degenerative disc disease 12/14/2013  . Migraine    "daily to weekly" (10/11/2016)  . Pneumonia    "several times; last time was in 03/2016" (10/11/2016)  . Sleep apnea   . Stress fracture of foot 12/14/2013   "right"   Past Surgical History: Past Surgical History:  Procedure Laterality Date  . BILATERAL CARPAL TUNNEL RELEASE Bilateral   . CORONARY ANGIOPLASTY WITH STENT PLACEMENT  10/11/2016  . CORONARY STENT INTERVENTION N/A 10/11/2016   Procedure: CORONARY STENT INTERVENTION;  Surgeon: Runell GessBerry, Jonathan J, MD;  [facility]: [facility];  Service: Cardiovascular;  Laterality: N/A;  prox RCA  . KNEE ARTHROSCOPY Left 1978  . KNEE CARTILAGE SURGERY Left   . LEFT HEART CATH AND CORONARY ANGIOGRAPHY N/A 10/11/2016   Procedure: LEFT HEART CATH AND CORONARY ANGIOGRAPHY;  Surgeon: Runell GessBerry, Jonathan J, MD;   Location: [facility];  Service: Cardiovascular;  Laterality: N/A;   Social History: Social History   Socioeconomic History  . Marital status: Married    Spouse name: Gunnar Fusiaula  . Number of children: 2  . Years of education: Masters  . Highest education level: Not on file  Occupational History  . Occupation: [employer]  Social Needs  . Financial resource strain: Not on file  . Food insecurity:    Worry: Not on file    Inability: Not on file  . Transportation needs:    Medical: Not on file    Non-medical: Not on file  Tobacco Use  . Smoking status: Former Smoker    Packs/day: 0.33    Years: 44.00    Pack years: 14.52    Types: Cigarettes    Last attempt to quit: 09/29/2016    Years since quitting: 1.7  . Smokeless tobacco: Never Used  Substance and Sexual Activity  . Alcohol use: Yes    Alcohol/week: 6.0 standard drinks    Types: 3 Glasses of wine, 3 Standard drinks or equivalent per week  . Drug use: No  . Sexual activity: Not Currently    Partners: Female  Lifestyle  . Physical activity:    Days per week: Not on file    Minutes per session: Not on file  . Stress: Not on file  Relationships  . Social connections:    Talks on phone: Not on file  Gets together: Not on file    Attends religious service: Not on file    Active member of club or organization: Not on file    Attends meetings of clubs or organizations: Not on file    Relationship status: Not on file  Other Topics Concern  . Not on file  Social History Narrative   Lives with wife and son   Caffeine use: Coffee daily   Right handed    Family History: Family History  Problem Relation Age of Onset  . Brain cancer Father   . Alcoholism Mother   . Diabetes Mother        father   . Lung cancer Mother   . Cancer Unknown   . Cancer - Prostate Brother    Allergies: Allergies  Allergen Reactions  . Azithromycin Other (See Comments)    Tingling/swelling in lips/tongue  . E-Mycin [Erythromycin] Nausea  Only    GI upset    Medications: See med rec.  Review of Systems: No fevers, chills, night sweats, weight loss, chest pain, or shortness of breath.   Objective:    General: Well Developed, well nourished, and in no acute distress.  Neuro: Alert and oriented x3, extra-ocular muscles intact, sensation grossly intact.  HEENT: Normocephalic, atraumatic, pupils equal round reactive to light, neck supple, no masses, no lymphadenopathy, thyroid nonpalpable.  Skin: Warm and dry, no rashes. Cardiac: Regular rate and rhythm, no murmurs rubs or gallops, no lower extremity edema.  Respiratory: Clear to auscultation bilaterally. Not using accessory muscles, speaking in full sentences. Neck: Negative spurling's Full neck range of motion Grip strength and sensation normal in bilateral hands Strength good C4 to T1 distribution No sensory change to C4 to T1 Reflexes normal  Impression and Recommendations:    DDD (degenerative disc disease), cervical Left and right periscapular radicular symptoms. Patient declines physical therapy, we are going to try chiropractic manipulation, Flexeril, meloxicam, x-rays. Return in 1 month, MRI for interventional planning if no better, he did respond very well to an epidural in the past.   ___________________________________________ [name]. Benjamin Stain, M.D., ABFM., CAQSM. Primary Care and Sports Medicine [facility]  Adjunct Professor of Family Medicine  [employer]

## 2018-06-25 ENCOUNTER — Encounter: Payer: Self-pay | Admitting: Sports Medicine

## 2018-06-25 DIAGNOSIS — M51369 Other intervertebral disc degeneration, lumbar region without mention of lumbar back pain or lower extremity pain: Secondary | ICD-10-CM

## 2018-06-25 DIAGNOSIS — M5136 Other intervertebral disc degeneration, lumbar region: Secondary | ICD-10-CM

## 2018-06-25 NOTE — Addendum Note (Signed)
Addended by: Monica Becton on: 06/25/2018 04:05 PM   Modules accepted: Orders

## 2018-07-09 DIAGNOSIS — M4722 Other spondylosis with radiculopathy, cervical region: Secondary | ICD-10-CM | POA: Insufficient documentation

## 2018-07-09 DIAGNOSIS — M4802 Spinal stenosis, cervical region: Secondary | ICD-10-CM | POA: Insufficient documentation

## 2018-07-09 DIAGNOSIS — G894 Chronic pain syndrome: Secondary | ICD-10-CM | POA: Insufficient documentation

## 2018-07-10 ENCOUNTER — Encounter: Payer: Self-pay | Admitting: Sports Medicine

## 2018-07-10 ENCOUNTER — Ambulatory Visit (INDEPENDENT_AMBULATORY_CARE_PROVIDER_SITE_OTHER): Payer: BC Managed Care – PPO | Admitting: Sports Medicine

## 2018-07-10 DIAGNOSIS — M5030 Other cervical disc degeneration, unspecified cervical region: Secondary | ICD-10-CM

## 2018-07-10 MED ORDER — TRAMADOL HCL 50 MG PO TABS: 50.0000 mg | ORAL_TABLET | Freq: Three times a day (TID) | ORAL | 0 refills | Status: DC | PRN

## 2018-07-10 NOTE — Assessment & Plan Note (Signed)
Left and right periscapular radicular symptoms. Joseph Jacobs has not yet started chiropractic manipulation. Flexeril and meloxicam provide meager improvements. He did see Dr. Francesco Runner, they are planning injection, it sounds as though Dr. Francesco Runner is planning more in the realm of facet joint injections. I like to see Joseph Jacobs a month after Dr. Francesco Runner is finished with him. Adding some tramadol to hold him over in the meantime.

## 2018-07-10 NOTE — Progress Notes (Signed)
Subjective:    CC: Neck pain  HPI: Narada has multilevel cervical DDD with facet arthritis, he finally saw Dr. Francesco Runner who is suggesting facet medial branch blocks followed by RFA if effective.  This is scheduled for next Friday.  I reviewed the past medical history, family history, social history, surgical history, and allergies today and no changes were needed.  Please see the problem list section below in epic for further details.  Past Medical History: Past Medical History:  Diagnosis Date  . Anginal pain ([place])   . Arthritis    "hands, knees, back, rightt foot" (10/11/2016)  . Asthma   . Chronic bronchitis ([place])    "get it most years" (10/11/2016)  . Chronic lower back pain   . Coronary artery disease   . High cholesterol   . Lumbar degenerative disc disease 12/14/2013  . Migraine    "daily to weekly" (10/11/2016)  . Pneumonia    "several times; last time was in 03/2016" (10/11/2016)  . Sleep apnea   . Stress fracture of foot 12/14/2013   "right"   Past Surgical History: Past Surgical History:  Procedure Laterality Date  . BILATERAL CARPAL TUNNEL RELEASE Bilateral   . CORONARY ANGIOPLASTY WITH STENT PLACEMENT  10/11/2016  . CORONARY STENT INTERVENTION N/A 10/11/2016   Procedure: CORONARY STENT INTERVENTION;  Surgeon: Lorretta Harp, MD;  Location: [facility];  Service: Cardiovascular;  Laterality: N/A;  prox RCA  . KNEE ARTHROSCOPY Left 1978  . KNEE CARTILAGE SURGERY Left   . LEFT HEART CATH AND CORONARY ANGIOGRAPHY N/A 10/11/2016   Procedure: LEFT HEART CATH AND CORONARY ANGIOGRAPHY;  Surgeon: Lorretta Harp, MD;  Location: [facility];  Service: Cardiovascular;  Laterality: N/A;   Social History: Social History   Socioeconomic History  . Marital status: Married    Spouse name: Nevin Bloodgood  . Number of children: 2  . Years of education: Masters  . Highest education level: Not on file  Occupational History  . Occupation: [employer]  Social Needs  .  Financial resource strain: Not on file  . Food insecurity    Worry: Not on file    Inability: Not on file  . Transportation needs    Medical: Not on file    Non-medical: Not on file  Tobacco Use  . Smoking status: Former Smoker    Packs/day: 0.33    Years: 44.00    Pack years: 14.52    Types: Cigarettes    Quit date: 09/29/2016    Years since quitting: 1.7  . Smokeless tobacco: Never Used  Substance and Sexual Activity  . Alcohol use: Yes    Alcohol/week: 6.0 standard drinks    Types: 3 Glasses of wine, 3 Standard drinks or equivalent per week  . Drug use: No  . Sexual activity: Not Currently    Partners: Female  Lifestyle  . Physical activity    Days per week: Not on file    Minutes per session: Not on file  . Stress: Not on file  Relationships  . Social Herbalist on phone: Not on file    Gets together: Not on file    Attends religious service: Not on file    Active member of club or organization: Not on file    Attends meetings of clubs or organizations: Not on file    Relationship status: Not on file  Other Topics Concern  . Not on file  Social History Narrative   Lives  with wife and son   Caffeine use: Coffee daily   Right handed    Family History: Family History  Problem Relation Age of Onset  . Brain cancer Father   . Alcoholism Mother   . Diabetes Mother        father   . Lung cancer Mother   . Cancer Unknown   . Cancer - Prostate Brother    Allergies: Allergies  Allergen Reactions  . Azithromycin Other (See Comments)    Tingling/swelling in lips/tongue  . E-Mycin [Erythromycin] Nausea Only    GI upset    Medications: See med rec.  Review of Systems: No fevers, chills, night sweats, weight loss, chest pain, or shortness of breath.   Objective:    General: Well Developed, well nourished, and in no acute distress.  Neuro: Alert and oriented x3, extra-ocular muscles intact, sensation grossly intact.  HEENT: Normocephalic, atraumatic,  pupils equal round reactive to light, neck supple, no masses, no lymphadenopathy, thyroid nonpalpable.  Skin: Warm and dry, no rashes. Cardiac: Regular rate and rhythm, no murmurs rubs or gallops, no lower extremity edema.  Respiratory: Clear to auscultation bilaterally. Not using accessory muscles, speaking in full sentences.  Impression and Recommendations:    DDD (degenerative disc disease), cervical Left and right periscapular radicular symptoms. Thayer OhmChris has not yet started chiropractic manipulation. Flexeril and meloxicam provide meager improvements. He did see Dr. Laurian Brim'Toole, they are planning injection, it sounds as though Dr. Laurian Brim'Toole is planning more in the realm of facet joint injections. I like to see Thayer OhmChris a month after Dr. Laurian Brim'Toole is finished with him. Adding some tramadol to hold him over in the meantime.   ___________________________________________ Ihor Austinhomas J. Benjamin Stainhekkekandam, M.D., ABFM., CAQSM. Primary Care and Sports Medicine [facility]  Adjunct Professor of Family Medicine  [employer]

## 2018-07-21 ENCOUNTER — Ambulatory Visit (INDEPENDENT_AMBULATORY_CARE_PROVIDER_SITE_OTHER): Payer: BC Managed Care – PPO | Admitting: Sports Medicine

## 2018-07-21 ENCOUNTER — Encounter: Payer: Self-pay | Admitting: Sports Medicine

## 2018-07-21 DIAGNOSIS — M5030 Other cervical disc degeneration, unspecified cervical region: Secondary | ICD-10-CM

## 2018-07-21 NOTE — Assessment & Plan Note (Signed)
Left and right periscapular radicular symptoms. He had a cervical epidural with Dr. Francesco Runner 3 days ago. I filled his FMLA paperwork, he is looking for full SSI disability. Return to see me as needed.

## 2018-07-21 NOTE — Progress Notes (Signed)
Subjective:    CC: Follow-up  HPI: Joseph Jacobs returns, he has cervical DDD, he had his cervical epidural 3 days ago.  In addition he would like me to fill out some FMLA paperwork.  I reviewed the past medical history, family history, social history, surgical history, and allergies today and no changes were needed.  Please see the problem list section below in epic for further details.  Past Medical History: Past Medical History:  Diagnosis Date  . Anginal pain ([facility])   . Arthritis    "hands, knees, back, rightt foot" (10/11/2016)  . Asthma   . Chronic bronchitis ([facility])    "get it most years" (10/11/2016)  . Chronic lower back pain   . Coronary artery disease   . High cholesterol   . Lumbar degenerative disc disease 12/14/2013  . Migraine    "daily to weekly" (10/11/2016)  . Pneumonia    "several times; last time was in 03/2016" (10/11/2016)  . Sleep apnea   . Stress fracture of foot 12/14/2013   "right"   Past Surgical History: Past Surgical History:  Procedure Laterality Date  . BILATERAL CARPAL TUNNEL RELEASE Bilateral   . CORONARY ANGIOPLASTY WITH STENT PLACEMENT  10/11/2016  . CORONARY STENT INTERVENTION N/A 10/11/2016   Procedure: CORONARY STENT INTERVENTION;  Surgeon: Runell GessBerry, Jonathan J, MD;  [facility];  Service: Cardiovascular;  Laterality: N/A;  prox RCA  . KNEE ARTHROSCOPY Left 1978  . KNEE CARTILAGE SURGERY Left   . LEFT HEART CATH AND CORONARY ANGIOGRAPHY N/A 10/11/2016   Procedure: LEFT HEART CATH AND CORONARY ANGIOGRAPHY;  Surgeon: Runell GessBerry, Jonathan J, MD;  [facility];  Service: Cardiovascular;  Laterality: N/A;   Social History: Social History   Socioeconomic History  . Marital status: Married    Spouse name: Gunnar Fusiaula  . Number of children: 2  . Years of education: Masters  . Highest education level: Not on file  Occupational History  . Occupation: [employer]  Social Needs  . Financial resource strain: Not on file  . Food insecurity    Worry: Not on file    Inability: Not on file  . Transportation needs    Medical: Not on file    Non-medical: Not on file  Tobacco Use  . Smoking status: Former Smoker    Packs/day: 0.33    Years: 44.00    Pack years: 14.52    Types: Cigarettes    Quit date: 09/29/2016    Years since quitting: 1.8  . Smokeless tobacco: Never Used  Substance and Sexual Activity  . Alcohol use: Yes    Alcohol/week: 6.0 standard drinks    Types: 3 Glasses of wine, 3 Standard drinks or equivalent per week  . Drug use: No  . Sexual activity: Not Currently    Partners: Female  Lifestyle  . Physical activity    Days per week: Not on file    Minutes per session: Not on file  . Stress: Not on file  Relationships  . Social Musicianconnections    Talks on phone: Not on file    Gets together: Not on file    Attends religious service: Not on file    Active member of club or organization: Not on file    Attends meetings of clubs or organizations: Not on file    Relationship status: Not on file  Other Topics Concern  . Not on file  Social History Narrative   Lives with wife and son   Caffeine  use: Coffee daily   Right handed    Family History: Family History  Problem Relation Age of Onset  . Brain cancer Father   . Alcoholism Mother   . Diabetes Mother        father   . Lung cancer Mother   . Cancer Unknown   . Cancer - Prostate Brother    Allergies: Allergies  Allergen Reactions  . Azithromycin Other (See Comments)    Tingling/swelling in lips/tongue  . E-Mycin [Erythromycin] Nausea Only    GI upset    Medications: See med rec.  Review of Systems: No fevers, chills, night sweats, weight loss, chest pain, or shortness of breath.   Objective:    General: Well Developed, well nourished, and in no acute distress.  Neuro: Alert and oriented x3, extra-ocular muscles intact, sensation grossly intact.  HEENT: Normocephalic, atraumatic, pupils equal round reactive to light, neck supple, no masses,  no lymphadenopathy, thyroid nonpalpable.  Skin: Warm and dry, no rashes. Cardiac: Regular rate and rhythm, no murmurs rubs or gallops, no lower extremity edema.  Respiratory: Clear to auscultation bilaterally. Not using accessory muscles, speaking in full sentences.  Impression and Recommendations:    DDD (degenerative disc disease), cervical Left and right periscapular radicular symptoms. He had a cervical epidural with Dr. Francesco Runner 3 days ago. I filled his FMLA paperwork, he is looking for full SSI disability. Return to see me as needed.   ___________________________________________ Gwen Her. Dianah Field, M.D., ABFM., CAQSM. Primary Care and Sports Medicine [facility]  Adjunct Professor of [place] [employer] [facility] [employer] [facility]

## 2018-07-30 ENCOUNTER — Ambulatory Visit (INDEPENDENT_AMBULATORY_CARE_PROVIDER_SITE_OTHER): Payer: BC Managed Care – PPO

## 2018-07-30 ENCOUNTER — Ambulatory Visit (INDEPENDENT_AMBULATORY_CARE_PROVIDER_SITE_OTHER): Payer: BC Managed Care – PPO | Admitting: Sports Medicine

## 2018-07-30 ENCOUNTER — Other Ambulatory Visit: Payer: Self-pay

## 2018-07-30 ENCOUNTER — Encounter: Payer: Self-pay | Admitting: Sports Medicine

## 2018-07-30 DIAGNOSIS — M1611 Unilateral primary osteoarthritis, right hip: Secondary | ICD-10-CM | POA: Diagnosis not present

## 2018-07-30 NOTE — Assessment & Plan Note (Signed)
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 Last injected 3 years ago, injected again today. X-rays today. Rehab exercises given. Return to see me in a month. MR arthrogram if no better.

## 2018-07-30 NOTE — Progress Notes (Signed)
Subjective:    CC: Right hip pain  HPI: This is a very pleasant 63 year old male, recently he has had severe pain in his right hip, groin, worse with the first few steps, occasional catches, then with improvement with more weightbearing.  Severe, persistent, localized without radiation.  I reviewed the past medical history, family history, social history, surgical history, and allergies today and no changes were needed.  Please see the problem list section below in epic for further details.  Past Medical History: Past Medical History:  Diagnosis Date  . Anginal pain ([facility])   . Arthritis    "hands, knees, back, rightt foot" (10/11/2016)  . Asthma   . Chronic bronchitis ([facility])    "get it most years" (10/11/2016)  . Chronic lower back pain   . Coronary artery disease   . High cholesterol   . Lumbar degenerative disc disease 12/14/2013  . Migraine    "daily to weekly" (10/11/2016)  . Pneumonia    "several times; last time was in 03/2016" (10/11/2016)  . Sleep apnea   . Stress fracture of foot 12/14/2013   "right"   Past Surgical History: Past Surgical History:  Procedure Laterality Date  . BILATERAL CARPAL TUNNEL RELEASE Bilateral   . CORONARY ANGIOPLASTY WITH STENT PLACEMENT  10/11/2016  . CORONARY STENT INTERVENTION N/A 10/11/2016   Procedure: CORONARY STENT INTERVENTION;  Surgeon: Runell GessBerry, Jonathan J, MD;  [facility];  Service: Cardiovascular;  Laterality: N/A;  prox RCA  . KNEE ARTHROSCOPY Left 1978  . KNEE CARTILAGE SURGERY Left   . LEFT HEART CATH AND CORONARY ANGIOGRAPHY N/A 10/11/2016   Procedure: LEFT HEART CATH AND CORONARY ANGIOGRAPHY;  Surgeon: Runell GessBerry, Jonathan J, MD;  [facility];  Service: Cardiovascular;  Laterality: N/A;   Social History: Social History   Socioeconomic History  . Marital status: Married    Spouse name: Gunnar Fusiaula  . Number of children: 2  . Years of education: Masters  . Highest education level: Not on file   Occupational History  . Occupation: [employer]  Social Needs  . Financial resource strain: Not on file  . Food insecurity    Worry: Not on file    Inability: Not on file  . Transportation needs    Medical: Not on file    Non-medical: Not on file  Tobacco Use  . Smoking status: Former Smoker    Packs/day: 0.33    Years: 44.00    Pack years: 14.52    Types: Cigarettes    Quit date: 09/29/2016    Years since quitting: 1.8  . Smokeless tobacco: Never Used  Substance and Sexual Activity  . Alcohol use: Yes    Alcohol/week: 6.0 standard drinks    Types: 3 Glasses of wine, 3 Standard drinks or equivalent per week  . Drug use: No  . Sexual activity: Not Currently    Partners: Female  Lifestyle  . Physical activity    Days per week: Not on file    Minutes per session: Not on file  . Stress: Not on file  Relationships  . Social Musicianconnections    Talks on phone: Not on file    Gets together: Not on file    Attends religious service: Not on file    Active member of club or organization: Not on file    Attends meetings of clubs or organizations: Not on file    Relationship status: Not on file  Other Topics Concern  . Not on file  Social History Narrative   Lives with wife and son   Caffeine use: Coffee daily   Right handed    Family History: Family History  Problem Relation Age of Onset  . Brain cancer Father   . Alcoholism Mother   . Diabetes Mother        father   . Lung cancer Mother   . Cancer Unknown   . Cancer - Prostate Brother    Allergies: Allergies  Allergen Reactions  . Azithromycin Other (See Comments)    Tingling/swelling in lips/tongue  . E-Mycin [Erythromycin] Nausea Only    GI upset    Medications: See med rec.  Review of Systems: No fevers, chills, night sweats, weight loss, chest pain, or shortness of breath.   Objective:    General: Well Developed, well nourished, and in no acute distress.  Neuro: Alert and oriented x3, extra-ocular muscles intact,  sensation grossly intact.  HEENT: Normocephalic, atraumatic, pupils equal round reactive to light, neck supple, no masses, no lymphadenopathy, thyroid nonpalpable.  Skin: Warm and dry, no rashes. Cardiac: Regular rate and rhythm, no murmurs rubs or gallops, no lower extremity edema.  Respiratory: Clear to auscultation bilaterally. Not using accessory muscles, speaking in full sentences. Right hip: ROM IR: 60 Deg, ER: 60 Deg, Flexion: 120 Deg, Extension: 100 Deg, Abduction: 45 Deg, Adduction: 45 Deg Strength IR: 5/5 with severe pain, ER: 5/5, Flexion: 5/5, Extension: 5/5, Abduction: 5/5, Adduction: 5/5 Pelvic alignment unremarkable to inspection and palpation. Standing hip rotation and gait without trendelenburg / unsteadiness. Greater trochanter without tenderness to palpation. No tenderness over piriformis. No SI joint tenderness and normal minimal SI movement.  Procedure: Real-time Ultrasound Guided injection of the right hip joint Device: GE Logiq E  Verbal informed consent obtained.  Time-out conducted.  Noted no overlying erythema, induration, or other signs of local infection.  Skin prepped in a sterile fashion.  Local anesthesia: Topical Ethyl chloride.  With sterile technique and under real time ultrasound guidance:  1 cc Kenalog 40, 2 cc lidocaine, 2 cc bupivacaine injected easily Completed without difficulty  Pain immediately resolved suggesting accurate placement of the medication.  Advised to call if fevers/chills, erythema, induration, drainage, or persistent bleeding.  Images permanently stored and available for review in the ultrasound unit.  Impression: Technically successful ultrasound guided injection.  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 Last injected 3 years ago, injected again today. X-rays today. Rehab exercises given. Return to see me in a month. MR  arthrogram if no better.   ___________________________________________ Gwen Her. Dianah Field, M.D., ABFM., CAQSM. Primary Care and Sports Medicine [facility] [facility]  Adjunct Professor of [place] [employer] [facility] [employer]

## 2018-07-31 ENCOUNTER — Encounter: Payer: Self-pay | Admitting: Sports Medicine

## 2018-08-06 ENCOUNTER — Ambulatory Visit (INDEPENDENT_AMBULATORY_CARE_PROVIDER_SITE_OTHER): Payer: BC Managed Care – PPO | Admitting: Sports Medicine

## 2018-08-06 ENCOUNTER — Encounter: Payer: Self-pay | Admitting: Sports Medicine

## 2018-08-06 ENCOUNTER — Other Ambulatory Visit: Payer: Self-pay

## 2018-08-06 DIAGNOSIS — W57XXXA Bitten or stung by nonvenomous insect and other nonvenomous arthropods, initial encounter: Secondary | ICD-10-CM

## 2018-08-06 DIAGNOSIS — S30862A Insect bite (nonvenomous) of penis, initial encounter: Secondary | ICD-10-CM

## 2018-08-06 MED ORDER — DOXYCYCLINE HYCLATE 100 MG PO TABS: 100.0000 mg | ORAL_TABLET | Freq: Two times a day (BID) | ORAL | 0 refills | Status: AC

## 2018-08-06 NOTE — Progress Notes (Signed)
Subjective:    CC: Possible tick  HPI: Joseph Jacobs thinks he might have a tick on his penis.  He feels as though it is been there for about a week.  No pain.  No muscle aches, body aches, fevers, chills, skin rash.  I reviewed the past medical history, family history, social history, surgical history, and allergies today and no changes were needed.  Please see the problem list section below in epic for further details.  Past Medical History: Past Medical History:  Diagnosis Date  . Anginal pain ([facility])   . Arthritis    "hands, knees, back, rightt foot" (10/11/2016)  . Asthma   . Chronic bronchitis ([facility])    "get it most years" (10/11/2016)  . Chronic lower back pain   . Coronary artery disease   . High cholesterol   . Lumbar degenerative disc disease 12/14/2013  . Migraine    "daily to weekly" (10/11/2016)  . Pneumonia    "several times; last time was in 03/2016" (10/11/2016)  . Sleep apnea   . Stress fracture of foot 12/14/2013   "right"   Past Surgical History: Past Surgical History:  Procedure Laterality Date  . BILATERAL CARPAL TUNNEL RELEASE Bilateral   . CORONARY ANGIOPLASTY WITH STENT PLACEMENT  10/11/2016  . CORONARY STENT INTERVENTION N/A 10/11/2016   Procedure: CORONARY STENT INTERVENTION;  Surgeon: Runell GessBerry, Jonathan J, MD;  [facility];  Service: Cardiovascular;  Laterality: N/A;  prox RCA  . KNEE ARTHROSCOPY Left 1978  . KNEE CARTILAGE SURGERY Left   . LEFT HEART CATH AND CORONARY ANGIOGRAPHY N/A 10/11/2016   Procedure: LEFT HEART CATH AND CORONARY ANGIOGRAPHY;  Surgeon: Runell GessBerry, Jonathan J, MD;  [facility];  Service: Cardiovascular;  Laterality: N/A;   Social History: Social History   Socioeconomic History  . Marital status: Married    Spouse name: Gunnar Fusiaula  . Number of children: 2  . Years of education: Masters  . Highest education level: Not on file  Occupational History  . Occupation: [employer]  Social Needs  . Financial resource strain:  Not on file  . Food insecurity    Worry: Not on file    Inability: Not on file  . Transportation needs    Medical: Not on file    Non-medical: Not on file  Tobacco Use  . Smoking status: Former Smoker    Packs/day: 0.33    Years: 44.00    Pack years: 14.52    Types: Cigarettes    Quit date: 09/29/2016    Years since quitting: 1.8  . Smokeless tobacco: Never Used  Substance and Sexual Activity  . Alcohol use: Yes    Alcohol/week: 6.0 standard drinks    Types: 3 Glasses of wine, 3 Standard drinks or equivalent per week  . Drug use: No  . Sexual activity: Not Currently    Partners: Female  Lifestyle  . Physical activity    Days per week: Not on file    Minutes per session: Not on file  . Stress: Not on file  Relationships  . Social Musicianconnections    Talks on phone: Not on file    Gets together: Not on file    Attends religious service: Not on file    Active member of club or organization: Not on file    Attends meetings of clubs or organizations: Not on file    Relationship status: Not on file  Other Topics Concern  . Not on file  Social History  Narrative   Lives with wife and son   Caffeine use: Coffee daily   Right handed    Family History: Family History  Problem Relation Age of Onset  . Brain cancer Father   . Alcoholism Mother   . Diabetes Mother        father   . Lung cancer Mother   . Cancer Unknown   . Cancer - Prostate Brother    Allergies: Allergies  Allergen Reactions  . Azithromycin Other (See Comments)    Tingling/swelling in lips/tongue  . E-Mycin [Erythromycin] Nausea Only    GI upset    Medications: See med rec.  Review of Systems: No fevers, chills, night sweats, weight loss, chest pain, or shortness of breath.   Objective:    General: Well Developed, well nourished, and in no acute distress.  Neuro: Alert and oriented x3, extra-ocular muscles intact, sensation grossly intact.  HEENT: Normocephalic, atraumatic, pupils equal round reactive  to light, neck supple, no masses, no lymphadenopathy, thyroid nonpalpable.  Skin: Warm and dry, no rashes. Cardiac: Regular rate and rhythm, no murmurs rubs or gallops, no lower extremity edema.  Respiratory: Clear to auscultation bilaterally. Not using accessory muscles, speaking in full sentences. Genitalia: There does appear to be a tick on the base of the shaft of his penis.  This was grasped with forceps and removed including the proboscis.  It appears to be a [employer] tick.  Impression and Recommendations:    Tick bite Tick was removed with forceps including its head. It appears to be a [employer] tick. Checking antibody titers for Borrelia burgdorferi, Rickettsia Rickettsie, Babesia microti, and Ehrlichia chaffensis. Adding doxycycline for 7 days.   ___________________________________________ Gwen Her. Dianah Field, M.D., ABFM., CAQSM. Primary Care and Sports Medicine  [facility]  Adjunct Professor of [place] [employer] [facility] [employer] Medicine

## 2018-08-06 NOTE — Assessment & Plan Note (Signed)
Tick was removed with forceps including its head. It appears to be a [employer] tick. Checking antibody titers for Borrelia burgdorferi, Rickettsia Rickettsie, Babesia microti, and Ehrlichia chaffensis. Adding doxycycline for 7 days.

## 2018-08-08 ENCOUNTER — Ambulatory Visit: Payer: BC Managed Care – PPO | Admitting: Sports Medicine

## 2018-08-08 ENCOUNTER — Encounter: Payer: Self-pay | Admitting: Sports Medicine

## 2018-08-11 LAB — B. BURGDORFI ANTIBODIES: B burgdorferi Ab IgG+IgM: <0.9 index

## 2018-08-11 LAB — EHRLICHIA ANTIBODY PANEL
E. CHAFFEENSIS AB IGG: <1:64 {titer}
E. CHAFFEENSIS AB IGM: <1:20 {titer}

## 2018-08-11 LAB — BABESIA MICROTI ANTIBODIES IGG, IGM
Babesia microti IgG: <1:64 {titer}
Babesia microti IgM: <1:20 {titer}

## 2018-08-11 LAB — ROCKY MTN SPOTTED FVR ABS PNL(IGG+IGM)
RMSF IgG: NOT DETECTED
RMSF IgM: NOT DETECTED

## 2018-08-27 ENCOUNTER — Ambulatory Visit: Payer: BC Managed Care – PPO | Admitting: Sports Medicine

## 2018-09-01 LAB — HM DIABETES EYE EXAM

## 2018-09-02 ENCOUNTER — Encounter: Payer: Self-pay | Admitting: Sports Medicine

## 2018-09-03 ENCOUNTER — Ambulatory Visit (INDEPENDENT_AMBULATORY_CARE_PROVIDER_SITE_OTHER): Payer: BC Managed Care – PPO | Admitting: Sports Medicine

## 2018-09-03 ENCOUNTER — Other Ambulatory Visit: Payer: Self-pay

## 2018-09-03 ENCOUNTER — Encounter: Payer: Self-pay | Admitting: Sports Medicine

## 2018-09-03 DIAGNOSIS — M180 Bilateral primary osteoarthritis of first carpometacarpal joints: Secondary | ICD-10-CM

## 2018-09-03 DIAGNOSIS — M5136 Other intervertebral disc degeneration, lumbar region: Secondary | ICD-10-CM

## 2018-09-03 DIAGNOSIS — M1811 Unilateral primary osteoarthritis of first carpometacarpal joint, right hand: Secondary | ICD-10-CM | POA: Diagnosis not present

## 2018-09-03 DIAGNOSIS — M1611 Unilateral primary osteoarthritis, right hip: Secondary | ICD-10-CM | POA: Diagnosis not present

## 2018-09-03 DIAGNOSIS — M51369 Other intervertebral disc degeneration, lumbar region without mention of lumbar back pain or lower extremity pain: Secondary | ICD-10-CM

## 2018-09-03 NOTE — Progress Notes (Signed)
Subjective:    CC: Follow-up several issues  HPI: Right hip osteoarthritis: Pain resolved after hip joint injection.  Right hand pain: Pain at the thumb basal joint, now with a recurrence of pain, previous injection was in April of this year, now with recurrence of pain, moderate, persistent, localized without radiation.  Lumbar spondylosis: Did well with a facet joint injection, he is currently in the process of medial branch blocks and radiofrequency ablation but insurance is requiring an additional 6 weeks of physical therapy.  I reviewed the past medical history, family history, social history, surgical history, and allergies today and no changes were needed.  Please see the problem list section below in epic for further details.  Past Medical History: Past Medical History:  Diagnosis Date  . Anginal pain (HCC)   . Arthritis    "hands, knees, back, rightt foot" (10/11/2016)  . Asthma   . Chronic bronchitis (HCC)    "get it most years" (10/11/2016)  . Chronic lower back pain   . Coronary artery disease   . High cholesterol   . Lumbar degenerative disc disease 12/14/2013  . Migraine    "daily to weekly" (10/11/2016)  . Pneumonia    "several times; last time was in 03/2016" (10/11/2016)  . Sleep apnea   . Stress fracture of foot 12/14/2013   "right"   Past Surgical History: Past Surgical History:  Procedure Laterality Date  . BILATERAL CARPAL TUNNEL RELEASE Bilateral   . CORONARY ANGIOPLASTY WITH STENT PLACEMENT  10/11/2016  . CORONARY STENT INTERVENTION N/A 10/11/2016   Procedure: CORONARY STENT INTERVENTION;  Surgeon: Runell GessBerry, Jonathan J, MD;  [facility]: [facility];  Service: Cardiovascular;  Laterality: N/A;  prox RCA  . KNEE ARTHROSCOPY Left 1978  . KNEE CARTILAGE SURGERY Left   . LEFT HEART CATH AND CORONARY ANGIOGRAPHY N/A 10/11/2016   Procedure: LEFT HEART CATH AND CORONARY ANGIOGRAPHY;  Surgeon: Runell GessBerry, Jonathan J, MD;  Location: [facility];  Service:  Cardiovascular;  Laterality: N/A;   Social History: Social History   Socioeconomic History  . Marital status: Married    Spouse name: Gunnar Fusiaula  . Number of children: 2  . Years of education: Masters  . Highest education level: Not on file  Occupational History  . Occupation: [employer]  Social Needs  . Financial resource strain: Not on file  . Food insecurity    Worry: Not on file    Inability: Not on file  . Transportation needs    Medical: Not on file    Non-medical: Not on file  Tobacco Use  . Smoking status: Former Smoker    Packs/day: 0.33    Years: 44.00    Pack years: 14.52    Types: Cigarettes    Quit date: 09/29/2016    Years since quitting: 1.9  . Smokeless tobacco: Never Used  Substance and Sexual Activity  . Alcohol use: Yes    Alcohol/week: 6.0 standard drinks    Types: 3 Glasses of wine, 3 Standard drinks or equivalent per week  . Drug use: No  . Sexual activity: Not Currently    Partners: Female  Lifestyle  . Physical activity    Days per week: Not on file    Minutes per session: Not on file  . Stress: Not on file  Relationships  . Social Musicianconnections    Talks on phone: Not on file    Gets together: Not on file    Attends religious service: Not on file  Active member of club or organization: Not on file    Attends meetings of clubs or organizations: Not on file    Relationship status: Not on file  Other Topics Concern  . Not on file  Social History Narrative   Lives with wife and son   Caffeine use: Coffee daily   Right handed    Family History: Family History  Problem Relation Age of Onset  . Brain cancer Father   . Alcoholism Mother   . Diabetes Mother        father   . Lung cancer Mother   . Cancer Unknown   . Cancer - Prostate Brother    Allergies: Allergies  Allergen Reactions  . Azithromycin Other (See Comments)    Tingling/swelling in lips/tongue  . E-Mycin [Erythromycin] Nausea Only    GI upset    Medications: See med rec.   Review of Systems: No fevers, chills, night sweats, weight loss, chest pain, or shortness of breath.   Objective:    General: Well Developed, well nourished, and in no acute distress.  Neuro: Alert and oriented x3, extra-ocular muscles intact, sensation grossly intact.  HEENT: Normocephalic, atraumatic, pupils equal round reactive to light, neck supple, no masses, no lymphadenopathy, thyroid nonpalpable.  Skin: Warm and dry, no rashes. Cardiac: Regular rate and rhythm, no murmurs rubs or gallops, no lower extremity edema.  Respiratory: Clear to auscultation bilaterally. Not using accessory muscles, speaking in full sentences. Right hand: Tender to palpation at the thumb basal joint.  Procedure: Real-time Ultrasound Guided injection of the right thumb basal joint Device: GE Logiq E  Verbal informed consent obtained.  Time-out conducted.  Noted no overlying erythema, induration, or other signs of local infection.  Skin prepped in a sterile fashion.  Local anesthesia: Topical Ethyl chloride.  With sterile technique and under real time ultrasound guidance:  25-gauge needle advanced between the trapezium and the first metacarpal, I injected 1/2 cc Kenalog 40, 1/2 cc lidocaine Completed without difficulty  Pain immediately resolved suggesting accurate placement of the medication.  Advised to call if fevers/chills, erythema, induration, drainage, or persistent bleeding.  Images permanently stored and available for review in the ultrasound unit.  Impression: Technically successful ultrasound guided injection.  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  Primary osteoarthritis of right hip Now resolved after injection a month ago.   ___________________________________________ Gwen Her. Dianah Field, M.D., ABFM., CAQSM. Primary Care and Sports Medicine  [facility]  Adjunct Professor of [place] [employer] [facility] of Medicine

## 2018-09-03 NOTE — Assessment & Plan Note (Addendum)
Right first [place] was injected in April, repeat right CMC injection today.

## 2018-09-03 NOTE — Assessment & Plan Note (Signed)
Now resolved after injection a month ago.

## 2018-09-08 ENCOUNTER — Ambulatory Visit: Payer: BC Managed Care – PPO | Admitting: Rehabilitative and Restorative Service Providers"

## 2018-09-11 ENCOUNTER — Other Ambulatory Visit: Payer: Self-pay | Admitting: Sports Medicine

## 2018-09-11 DIAGNOSIS — R072 Precordial pain: Secondary | ICD-10-CM

## 2018-09-11 DIAGNOSIS — G43809 Other migraine, not intractable, without status migrainosus: Secondary | ICD-10-CM

## 2018-09-11 DIAGNOSIS — I2584 Coronary atherosclerosis due to calcified coronary lesion: Secondary | ICD-10-CM

## 2018-09-11 DIAGNOSIS — I2510 Atherosclerotic heart disease of native coronary artery without angina pectoris: Secondary | ICD-10-CM

## 2018-09-17 ENCOUNTER — Other Ambulatory Visit: Payer: Self-pay

## 2018-09-17 ENCOUNTER — Ambulatory Visit (INDEPENDENT_AMBULATORY_CARE_PROVIDER_SITE_OTHER): Payer: BC Managed Care – PPO | Admitting: Rehabilitative and Restorative Service Providers"

## 2018-09-17 ENCOUNTER — Encounter: Payer: Self-pay | Admitting: Rehabilitative and Restorative Service Providers"

## 2018-09-17 DIAGNOSIS — G8929 Other chronic pain: Secondary | ICD-10-CM

## 2018-09-17 DIAGNOSIS — M5136 Other intervertebral disc degeneration, lumbar region: Secondary | ICD-10-CM | POA: Diagnosis not present

## 2018-09-17 DIAGNOSIS — R29898 Other symptoms and signs involving the musculoskeletal system: Secondary | ICD-10-CM

## 2018-09-17 DIAGNOSIS — M545 Low back pain, unspecified: Secondary | ICD-10-CM

## 2018-09-17 DIAGNOSIS — M6281 Muscle weakness (generalized): Secondary | ICD-10-CM | POA: Diagnosis not present
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
## 2018-09-17 NOTE — Patient Instructions (Addendum)
Trunk Extension    Standing, place back of open hands on low back. Straighten spine then arch the back and move shoulders back. Hold 2-3 sec  Repeat _several ___ times per day  Piriformis Stretch    Lying on back, pull right knee toward opposite shoulder. Hold __30__ seconds. Repeat __3__ times. Do __2__ sessions per day.   Access Code: [email or web address]  URL: [email or web address]  Date: 09/17/2018  Prepared by: Gillermo Murdoch   Exercises  Prone Press Up - 3 reps - 1 sets - 30 sec hold - 2x daily - 7x weekly  Patient Education  Trigger Point Dry Needling  Posture and Economist

## 2018-09-22 ENCOUNTER — Ambulatory Visit (INDEPENDENT_AMBULATORY_CARE_PROVIDER_SITE_OTHER): Payer: BC Managed Care – PPO | Admitting: Rehabilitative and Restorative Service Providers"

## 2018-09-22 ENCOUNTER — Other Ambulatory Visit: Payer: Self-pay

## 2018-09-22 ENCOUNTER — Encounter: Payer: Self-pay | Admitting: Rehabilitative and Restorative Service Providers"

## 2018-09-22 DIAGNOSIS — M545 Low back pain, unspecified: Secondary | ICD-10-CM

## 2018-09-22 DIAGNOSIS — M6281 Muscle weakness (generalized): Secondary | ICD-10-CM

## 2018-09-22 DIAGNOSIS — R29898 Other symptoms and signs involving the musculoskeletal system: Secondary | ICD-10-CM

## 2018-09-22 DIAGNOSIS — M5136 Other intervertebral disc degeneration, lumbar region: Secondary | ICD-10-CM | POA: Diagnosis not present

## 2018-09-22 DIAGNOSIS — G8929 Other chronic pain: Secondary | ICD-10-CM

## 2018-09-22 DIAGNOSIS — M51369 Other intervertebral disc degeneration, lumbar region without mention of lumbar back pain or lower extremity pain: Secondary | ICD-10-CM

## 2018-09-22 NOTE — Therapy (Signed)
[place], [place], [address] Phone: [phone]   Fax:  [phone]  Physical Therapy Treatment  Patient Details  Name: Joseph Jacobs MRN: [id number] Date of Birth: Mar 09, 1955 Referring Provider (PT): Dr Dianah Field    Encounter Date: 09/22/2018  PT End of Session - 09/22/18 0852    Visit Number  2    Number of Visits  12    Date for PT Re-Evaluation  10/29/18    PT Start Time  0851    PT Stop Time  0930    PT Time Calculation (min)  39 min    Activity Tolerance  Patient tolerated treatment well       Past Medical History:  Diagnosis Date  . Anginal pain ([place])   . Arthritis    "hands, knees, back, rightt foot" (10/11/2016)  . Asthma   . Chronic bronchitis ([place])    "get it most years" (10/11/2016)  . Chronic lower back pain   . Coronary artery disease   . High cholesterol   . Lumbar degenerative disc disease 12/14/2013  . Migraine    "daily to weekly" (10/11/2016)  . Pneumonia    "several times; last time was in 03/2016" (10/11/2016)  . Sleep apnea   . Stress fracture of foot 12/14/2013   "right"    Past Surgical History:  Procedure Laterality Date  . BILATERAL CARPAL TUNNEL RELEASE Bilateral   . CORONARY ANGIOPLASTY WITH STENT PLACEMENT  10/11/2016  . CORONARY STENT INTERVENTION N/A 10/11/2016   Procedure: CORONARY STENT INTERVENTION;  Surgeon: Lorretta Harp, MD;  Location: [facility];  Service: Cardiovascular;  Laterality: N/A;  prox RCA  . KNEE ARTHROSCOPY Left 1978  . KNEE CARTILAGE SURGERY Left   . LEFT HEART CATH AND CORONARY ANGIOGRAPHY N/A 10/11/2016   Procedure: LEFT HEART CATH AND CORONARY ANGIOGRAPHY;  Surgeon: Lorretta Harp, MD;  Location: [facility];  Service: Cardiovascular;  Laterality: N/A;    There were no vitals filed for this visit.  Subjective Assessment - 09/22/18 0852    Subjective  Patient reports that he has done some of the  exercises. He is sore but can do the exercises. Ready to try the DN today.    Currently in Pain?  Yes    Pain Score  7     Pain Location  Back    Pain Orientation  Lower;Right;Left    Pain Descriptors / Indicators  Sore    Pain Type  Chronic pain                       [facility] Adult PT Treatment/Exercise - 09/22/18 0001      Lumbar Exercises: Stretches   Press Ups  10 reps   2-3 sec hold - repeated post DN/manual work      Lumbar Exercises: Aerobic   Nustep  L5 x 5 min       Lumbar Exercises: Supine   Other Supine Lumbar Exercises  4 part core 10 sec x 10 reps       Moist Heat Therapy   Number Minutes Moist Heat  15 Minutes    Moist Heat Location  Lumbar Spine;Hip      Manual Therapy   Manual therapy comments  pt prone     Soft tissue mobilization  deep tissue work bilat lumbar paraspinals; bilat posterior hips     Myofascial Release  lumbar spine  Trigger Point Dry Needling - 09/22/18 0001    Consent Given?  Yes    Education Handout Provided  Yes    Other Dry Needling  bilat    Gluteus Medius Response  Palpable increased muscle length    Gluteus Maximus Response  Palpable increased muscle length    Piriformis Response  Palpable increased muscle length    Erector spinae Response  Palpable increased muscle length           PT Education - 09/22/18 0936    Education Details  HEP DN    Person(s) Educated  Patient    Methods  Explanation;Tactile cues;Verbal cues;Handout    Comprehension  Verbalized understanding;Returned demonstration;Verbal cues required;Tactile cues required          PT Long Term Goals - 09/17/18 1238      PT LONG TERM GOAL #1   Title  Improve trunk mobility to 60-70% with no change in pain level    Time  6    Period  Weeks    Status  New    Target Date  10/29/18      PT LONG TERM GOAL #2   Title  Increase LE and core strength with patient to tolerate 25-30 min of core stabilization exercises with minimal to no  change in pain level    Time  6    Period  Weeks    Status  New    Target Date  10/29/18      PT LONG TERM GOAL #3   Title  Patient to demonstrate correct body mechanics for positional changes and lifting    Time  6    Period  Weeks    Status  New    Target Date  10/29/18      PT LONG TERM GOAL #4   Title  Independent in HEP    Time  6    Period  Weeks    Status  New    Target Date  10/29/18      PT LONG TERM GOAL #5   Title  Improve FOTO to </= 51% limitation    Time  6    Period  Weeks    Status  New    Target Date  10/29/18            Plan - 09/22/18 [id number]    Clinical Impression Statement  Patient reports that he has done the exercises and notes some soreness but can do the exercises. Added Nustep and trial of DN/manual work through the lumbar spine; core stabilization exercise today. Tolerated all well.    Stability/Clinical Decision Making  Stable/Uncomplicated    Rehab Potential  Fair    PT Frequency  1x / week    PT Duration  6 weeks    PT Treatment/Interventions  Patient/family education;ADLs/Self Care Home Management;Cryotherapy;Electrical Stimulation;Iontophoresis 4mg /ml Dexamethasone;Moist Heat;Ultrasound;Traction;Manual techniques;Dry needling;Neuromuscular re-education;Therapeutic activities;Therapeutic exercise;Balance training    PT Next Visit Plan  review HEP; assess response to DN lumbar and hip musculature (Rt > Lt); core stabilization program    PT Home Exercise Plan  HEP VHI and medbridge    Recommended Other Services  [id number]    Consulted and Agree with Plan of Care  Patient       Patient will benefit from skilled therapeutic intervention in order to improve the following deficits and impairments:  Pain, Increased fascial restricitons, Increased muscle spasms, Hypomobility, Decreased strength, Decreased range of motion, Decreased mobility, Decreased activity tolerance  Visit Diagnosis:  DDD (degenerative disc disease), lumbar  Chronic bilateral  low back pain without sciatica  Other symptoms and signs involving the musculoskeletal system  Muscle weakness (generalized)     Problem List Patient Active Problem List   Diagnosis Date Noted  . Tick bite 08/06/2018  . Controlled type 2 diabetes mellitus without complication, without long-term current use of insulin (HCC) 01/16/2018  . Benign essential hypertension 01/14/2018  . Sleep apnea   . High cholesterol   . CAD in native artery   . Unstable angina (HCC)   . Subacromial bursitis of right shoulder joint 12/31/2016  . 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 Osteoarthritis of left patellofemoral joint 12/19/2016  . Forgetfulness 12/19/2016  . Palpitations 11/02/2016  . COPD mixed type (HCC) 10/16/2016  . Diastolic heart failure of unknown etiology (HCC) 10/11/2016  . Coronary artery calcification 09/29/2016  . Dyslipidemia 09/29/2016  . Former smoker 09/29/2016  . Presence of stent in coronary artery in patient with coronary artery disease 09/26/2016  . Annual physical exam 09/03/2016  . Metatarsalgia of right foot 06/28/2016  . Pain of right sacroiliac joint 03/08/2016  . Primary osteoarthritis of right hip 05/30/2015  . Memory loss 12/13/2014  . Osteoarthritis of right glenohumeral joint 01/25/2014  . Migraine 12/14/2013  . Lumbar degenerative disc disease 12/14/2013  . Primary osteoarthritis of right foot 12/14/2013  . Seasonal allergies 12/14/2013  . Stress fracture of foot 12/14/2013  . DDD (degenerative disc disease), cervical 08/11/2008    Tameka Hoiland Rober MinionP Vere Diantonio PT, MPH  09/22/2018, 9:37 AM  [facility] [address] [place], [place], [address] Phone: [phone]   Fax:  [phone]  Name: Justice RocherChristopher Collier MRN: [id number] Date of Birth: 08/22/1955

## 2018-09-22 NOTE — Patient Instructions (Signed)
Access Code: [email or web address]  URL: [email or web address]  Date: 09/22/2018  Prepared by: Gillermo Murdoch   Exercises  Prone Press Up - 3 reps - 1 sets - 30 sec hold - 2x daily - 7x weekly  Supine Transversus Abdominis Bracing - Hands on Stomach - 10 reps - 1 sets - 10 sec hold - 4-5x daily - 7x weekly  Patient Education  Biomedical scientist

## 2018-09-30 ENCOUNTER — Encounter: Payer: Self-pay | Admitting: Rehabilitative and Restorative Service Providers"

## 2018-09-30 ENCOUNTER — Ambulatory Visit (INDEPENDENT_AMBULATORY_CARE_PROVIDER_SITE_OTHER): Payer: BC Managed Care – PPO | Admitting: Rehabilitative and Restorative Service Providers"

## 2018-09-30 ENCOUNTER — Other Ambulatory Visit: Payer: Self-pay

## 2018-09-30 DIAGNOSIS — M5136 Other intervertebral disc degeneration, lumbar region: Secondary | ICD-10-CM | POA: Diagnosis not present

## 2018-09-30 DIAGNOSIS — M6281 Muscle weakness (generalized): Secondary | ICD-10-CM | POA: Diagnosis not present

## 2018-09-30 DIAGNOSIS — R29898 Other symptoms and signs involving the musculoskeletal system: Secondary | ICD-10-CM | POA: Diagnosis not present

## 2018-09-30 DIAGNOSIS — M545 Low back pain: Secondary | ICD-10-CM

## 2018-09-30 DIAGNOSIS — G8929 Other chronic pain: Secondary | ICD-10-CM

## 2018-09-30 NOTE — Therapy (Signed)
[place], [place], [address] Phone: [phone]   Fax:  [phone]  Physical Therapy Treatment  Patient Details  Name: Joseph Jacobs MRN: [id number] Date of Birth: 07-02-1955 Referring Provider (PT): Dr Dianah Field    Encounter Date: 09/30/2018  PT End of Session - 09/30/18 1024    Visit Number  3    Number of Visits  12    Date for PT Re-Evaluation  10/29/18    PT Start Time  1018    PT Stop Time  1058    PT Time Calculation (min)  40 min    Activity Tolerance  Patient tolerated treatment well       Past Medical History:  Diagnosis Date  . Anginal pain ([place])   . Arthritis    "hands, knees, back, rightt foot" (10/11/2016)  . Asthma   . Chronic bronchitis ([place])    "get it most years" (10/11/2016)  . Chronic lower back pain   . Coronary artery disease   . High cholesterol   . Lumbar degenerative disc disease 12/14/2013  . Migraine    "daily to weekly" (10/11/2016)  . Pneumonia    "several times; last time was in 03/2016" (10/11/2016)  . Sleep apnea   . Stress fracture of foot 12/14/2013   "right"    Past Surgical History:  Procedure Laterality Date  . BILATERAL CARPAL TUNNEL RELEASE Bilateral   . CORONARY ANGIOPLASTY WITH STENT PLACEMENT  10/11/2016  . CORONARY STENT INTERVENTION N/A 10/11/2016   Procedure: CORONARY STENT INTERVENTION;  Surgeon: Lorretta Harp, MD;  Location: [facility];  Service: Cardiovascular;  Laterality: N/A;  prox RCA  . KNEE ARTHROSCOPY Left 1978  . KNEE CARTILAGE SURGERY Left   . LEFT HEART CATH AND CORONARY ANGIOGRAPHY N/A 10/11/2016   Procedure: LEFT HEART CATH AND CORONARY ANGIOGRAPHY;  Surgeon: Lorretta Harp, MD;  Location: [facility];  Service: Cardiovascular;  Laterality: N/A;    There were no vitals filed for this visit.  Subjective Assessment - 09/30/18 1025    Subjective  Very sore following DN and soreness has continued  for the week since treatment. He has continued with yard work and "odds and ends around the house"    Currently in Pain?  Yes    Pain Score  7     Pain Location  Back    Pain Orientation  Lower;Right;Left    Pain Descriptors / Indicators  Sore;Aching    Pain Type  Chronic pain                       [facility] Adult PT Treatment/Exercise - 09/30/18 0001      Lumbar Exercises: Stretches   Press Ups  10 reps   2-3 sec hold - repeated post DN/manual work    Sports administrator  Right;Left;2 reps;30 seconds   prone with strap    Piriformis Stretch  Right;Left;2 reps;30 seconds   supine travell      Lumbar Exercises: Aerobic   Nustep  L5 x 5 min       Lumbar Exercises: Seated   Sit to Stand  5 reps   eccentric lowering to sit core engaged      Lumbar Exercises: Supine   Clam  10 reps;3 seconds   alternate LE's green TB core engaged    Bent Knee Raise  10 reps   with core engaged    [employer]  10 reps   core engaged    Other Supine Lumbar Exercises  4 part core 10 sec x 10 reps              PT Education - 09/30/18 1053    Education Details  HEP    Person(s) Educated  Patient    Methods  Explanation;Demonstration;Tactile cues;Verbal cues;Handout    Comprehension  Verbalized understanding;Returned demonstration;Verbal cues required;Tactile cues required          PT Long Term Goals - 09/17/18 1238      PT LONG TERM GOAL #1   Title  Improve trunk mobility to 60-70% with no change in pain level    Time  6    Period  Weeks    Status  New    Target Date  10/29/18      PT LONG TERM GOAL #2   Title  Increase LE and core strength with patient to tolerate 25-30 min of core stabilization exercises with minimal to no change in pain level    Time  6    Period  Weeks    Status  New    Target Date  10/29/18      PT LONG TERM GOAL #3   Title  Patient to demonstrate correct body mechanics for positional changes and lifting    Time  6    Period  Weeks    Status  New     Target Date  10/29/18      PT LONG TERM GOAL #4   Title  Independent in HEP    Time  6    Period  Weeks    Status  New    Target Date  10/29/18      PT LONG TERM GOAL #5   Title  Improve FOTO to </= 51% limitation    Time  6    Period  Weeks    Status  New    Target Date  10/29/18            Plan - 09/30/18 1025    Clinical Impression Statement  Patient reported increased soreness following the DN which has persisted - he has also been doing activities around his home that typically create the soreness. Focus on core stabilization exercises today.    Stability/Clinical Decision Making  Stable/Uncomplicated    Rehab Potential  Fair    PT Frequency  1x / week    PT Duration  6 weeks    PT Treatment/Interventions  Patient/family education;ADLs/Self Care Home Management;Cryotherapy;Electrical Stimulation;Iontophoresis 4mg /ml Dexamethasone;Moist Heat;Ultrasound;Traction;Manual techniques;Dry needling;Neuromuscular re-education;Therapeutic activities;Therapeutic exercise;Balance training    PT Next Visit Plan  review HEP; assess response to DN lumbar and hip musculature (Rt > Lt); core stabilization program    PT Home Exercise Plan  HEP VHI and medbridge    Recommended Other Services  [id number]    Consulted and Agree with Plan of Care  Patient       Patient will benefit from skilled therapeutic intervention in order to improve the following deficits and impairments:  Pain, Increased fascial restricitons, Increased muscle spasms, Hypomobility, Decreased strength, Decreased range of motion, Decreased mobility, Decreased activity tolerance  Visit Diagnosis: DDD (degenerative disc disease), lumbar  Chronic bilateral low back pain without sciatica  Other symptoms and signs involving the musculoskeletal system  Muscle weakness (generalized)     Problem List Patient Active Problem List   Diagnosis Date Noted  . Tick bite 08/06/2018  . Controlled type 2 diabetes mellitus  without complication, without long-term current use of insulin (HCC) 01/16/2018  . Benign essential hypertension 01/14/2018  . Sleep apnea   . High cholesterol   . CAD in native artery   . Unstable angina (HCC)   . Subacromial bursitis of right shoulder joint 12/31/2016  . 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 Osteoarthritis of left patellofemoral joint 12/19/2016  . Forgetfulness 12/19/2016  . Palpitations 11/02/2016  . COPD mixed type (HCC) 10/16/2016  . Diastolic heart failure of unknown etiology (HCC) 10/11/2016  . Coronary artery calcification 09/29/2016  . Dyslipidemia 09/29/2016  . Former smoker 09/29/2016  . Presence of stent in coronary artery in patient with coronary artery disease 09/26/2016  . Annual physical exam 09/03/2016  . Metatarsalgia of right foot 06/28/2016  . Pain of right sacroiliac joint 03/08/2016  . Primary osteoarthritis of right hip 05/30/2015  . Memory loss 12/13/2014  . Osteoarthritis of right glenohumeral joint 01/25/2014  . Migraine 12/14/2013  . Lumbar degenerative disc disease 12/14/2013  . Primary osteoarthritis of right foot 12/14/2013  . Seasonal allergies 12/14/2013  . Stress fracture of foot 12/14/2013  . DDD (degenerative disc disease), cervical 08/11/2008    Celyn Rober Minion PT, MPH  09/30/2018, 11:02 AM  [facility] [address] [place], [place], [address] Phone: [phone]   Fax:  [phone]  Name: Joseph Jacobs MRN: [id number] Date of Birth: 18-Aug-1955

## 2018-09-30 NOTE — Patient Instructions (Signed)
Access Code: [email or web address]  URL: [email or web address]  Date: 09/30/2018  Prepared by: Gillermo Murdoch   Exercises  Prone Press Up - 3 reps - 1 sets - 30 sec hold - 2x daily - 7x weekly  Supine Transversus Abdominis Bracing - Hands on Stomach - 10 reps - 1 sets - 10 sec hold - 4-5x daily - 7x weekly  Supine Hip Flexion - 10 reps - 1-3 sets - 1x daily - 7x weekly  Dead Bug - 10 reps - 1-3 sets - 1x daily - 7x weekly  Supine Bridge - 10 reps - 1-3 sets - 5 sec hold - 1x daily - 7x weekly  Hooklying Isometric Clamshell - 10 reps - 1-3 sets - 2-3 sec hold - 1x daily - 7x weekly  Sit to Stand - 10 reps - 1-3 sets - 30 sec hold - 2x daily - 7x weekly  Patient Education  Biomedical scientist

## 2018-10-01 ENCOUNTER — Ambulatory Visit (INDEPENDENT_AMBULATORY_CARE_PROVIDER_SITE_OTHER): Payer: BC Managed Care – PPO | Admitting: Sports Medicine

## 2018-10-01 DIAGNOSIS — M1811 Unilateral primary osteoarthritis of first carpometacarpal joint, right hand: Secondary | ICD-10-CM

## 2018-10-01 DIAGNOSIS — M7542 Impingement syndrome of left shoulder: Secondary | ICD-10-CM | POA: Diagnosis not present

## 2018-10-01 DIAGNOSIS — M5030 Other cervical disc degeneration, unspecified cervical region: Secondary | ICD-10-CM

## 2018-10-01 MED ORDER — SUMATRIPTAN SUCCINATE 100 MG PO TABS: ORAL_TABLET | ORAL | 11 refills | Status: DC

## 2018-10-01 MED ORDER — TRAMADOL HCL 50 MG PO TABS: 50.0000 mg | ORAL_TABLET | Freq: Three times a day (TID) | ORAL | 0 refills | Status: DC | PRN

## 2018-10-01 NOTE — Assessment & Plan Note (Signed)
Doing much better after the injection at the last visit

## 2018-10-01 NOTE — Progress Notes (Signed)
Subjective:    CC: Follow-up  HPI: [facility] osteoarthritis: Resolved after injection  Left shoulder pain: Moderate, persistent, localized over the deltoid, worse with abduction, no radiation past the elbow.  I reviewed the past medical history, family history, social history, surgical history, and allergies today and no changes were needed.  Please see the problem list section below in epic for further details.  Past Medical History: Past Medical History:  Diagnosis Date  . Anginal pain (HCC)   . Arthritis    "hands, knees, back, rightt foot" (10/11/2016)  . Asthma   . Chronic bronchitis (HCC)    "get it most years" (10/11/2016)  . Chronic lower back pain   . Coronary artery disease   . High cholesterol   . Lumbar degenerative disc disease 12/14/2013  . Migraine    "daily to weekly" (10/11/2016)  . Pneumonia    "several times; last time was in 03/2016" (10/11/2016)  . Sleep apnea   . Stress fracture of foot 12/14/2013   "right"   Past Surgical History: Past Surgical History:  Procedure Laterality Date  . BILATERAL CARPAL TUNNEL RELEASE Bilateral   . CORONARY ANGIOPLASTY WITH STENT PLACEMENT  10/11/2016  . CORONARY STENT INTERVENTION N/A 10/11/2016   Procedure: CORONARY STENT INTERVENTION;  Surgeon: Runell GessBerry, Jonathan J, MD;  [facility];  Service: Cardiovascular;  Laterality: N/A;  prox RCA  . KNEE ARTHROSCOPY Left 1978  . KNEE CARTILAGE SURGERY Left   . LEFT HEART CATH AND CORONARY ANGIOGRAPHY N/A 10/11/2016   Procedure: LEFT HEART CATH AND CORONARY ANGIOGRAPHY;  Surgeon: Runell GessBerry, Jonathan J, MD;  Location: [facility];  Service: Cardiovascular;  Laterality: N/A;   Social History: Social History   Socioeconomic History  . Marital status: Married    Spouse name: Gunnar Fusiaula  . Number of children: 2  . Years of education: Masters  . Highest education level: Not on file  Occupational History  . Occupation: [employer]  Social Needs  . Financial resource strain: Not  on file  . Food insecurity    Worry: Not on file    Inability: Not on file  . Transportation needs    Medical: Not on file    Non-medical: Not on file  Tobacco Use  . Smoking status: Former Smoker    Packs/day: 0.33    Years: 44.00    Pack years: 14.52    Types: Cigarettes    Quit date: 09/29/2016    Years since quitting: 2.0  . Smokeless tobacco: Never Used  Substance and Sexual Activity  . Alcohol use: Yes    Alcohol/week: 6.0 standard drinks    Types: 3 Glasses of wine, 3 Standard drinks or equivalent per week  . Drug use: No  . Sexual activity: Not Currently    Partners: Female  Lifestyle  . Physical activity    Days per week: Not on file    Minutes per session: Not on file  . Stress: Not on file  Relationships  . Social Musicianconnections    Talks on phone: Not on file    Gets together: Not on file    Attends religious service: Not on file    Active member of club or organization: Not on file    Attends meetings of clubs or organizations: Not on file    Relationship status: Not on file  Other Topics Concern  . Not on file  Social History Narrative   Lives with wife and son   Caffeine use: Coffee daily  Right handed    Family History: Family History  Problem Relation Age of Onset  . Brain cancer Father   . Alcoholism Mother   . Diabetes Mother        father   . Lung cancer Mother   . Cancer Unknown   . Cancer - Prostate Brother    Allergies: Allergies  Allergen Reactions  . Azithromycin Other (See Comments)    Tingling/swelling in lips/tongue  . E-Mycin [Erythromycin] Nausea Only    GI upset    Medications: See med rec.  Review of Systems: No fevers, chills, night sweats, weight loss, chest pain, or shortness of breath.   Objective:    General: Well Developed, well nourished, and in no acute distress.  Neuro: Alert and oriented x3, extra-ocular muscles intact, sensation grossly intact.  HEENT: Normocephalic, atraumatic, pupils equal round reactive to  light, neck supple, no masses, no lymphadenopathy, thyroid nonpalpable.  Skin: Warm and dry, no rashes. Cardiac: Regular rate and rhythm, no murmurs rubs or gallops, no lower extremity edema.  Respiratory: Clear to auscultation bilaterally. Not using accessory muscles, speaking in full sentences. Left shoulder: Inspection reveals no abnormalities, atrophy or asymmetry. Palpation is normal with no tenderness over AC joint or bicipital groove. ROM is full in all planes. Rotator cuff strength slightly weak to abduction Positive Neer and Hawkin's tests, empty can. Speeds and Yergason's tests normal. No labral pathology noted with negative Obrien's, negative crank, negative clunk, and good stability. Normal scapular function observed. No painful arc and no drop arm sign. 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 Adding formal physical therapy for his left shoulder.   ___________________________________________ Gwen Her. Dianah Field, M.D., ABFM., CAQSM. Primary Care and Sports Medicine [facility]  Adjunct Professor of [employer]

## 2018-10-01 NOTE — Assessment & Plan Note (Signed)
I think it is prudent to hold off on injections for the next 3 months or so. Adding formal physical therapy for his left shoulder.

## 2018-10-06 ENCOUNTER — Encounter: Payer: Self-pay | Admitting: Sports Medicine

## 2018-10-07 ENCOUNTER — Telehealth: Payer: Self-pay | Admitting: Sports Medicine

## 2018-10-07 NOTE — Telephone Encounter (Signed)
Error

## 2018-10-08 ENCOUNTER — Encounter: Payer: Self-pay | Admitting: Rehabilitative and Restorative Service Providers"

## 2018-10-08 ENCOUNTER — Ambulatory Visit (INDEPENDENT_AMBULATORY_CARE_PROVIDER_SITE_OTHER): Payer: BC Managed Care – PPO | Admitting: Rehabilitative and Restorative Service Providers"

## 2018-10-08 ENCOUNTER — Other Ambulatory Visit: Payer: Self-pay

## 2018-10-08 DIAGNOSIS — M5136 Other intervertebral disc degeneration, lumbar region: Secondary | ICD-10-CM | POA: Diagnosis not present

## 2018-10-08 DIAGNOSIS — R29898 Other symptoms and signs involving the musculoskeletal system: Secondary | ICD-10-CM | POA: Diagnosis not present

## 2018-10-08 DIAGNOSIS — M25512 Pain in left shoulder: Secondary | ICD-10-CM

## 2018-10-08 DIAGNOSIS — M545 Low back pain: Secondary | ICD-10-CM

## 2018-10-08 DIAGNOSIS — M6281 Muscle weakness (generalized): Secondary | ICD-10-CM | POA: Diagnosis not present

## 2018-10-08 DIAGNOSIS — G8929 Other chronic pain: Secondary | ICD-10-CM

## 2018-10-08 NOTE — Therapy (Signed)
[facility] [address] [place], [place], [address] Phone: [phone]   Fax:  [phone]  Physical Therapy Treatment  Patient Details  Name: Joseph RocherChristopher Jacobs MRN: [id number] Date of Birth: 02/22/1955 Referring Provider (PT): Dr Benjamin Stainhekkekandam    Encounter Date: 10/08/2018  PT End of Session - 10/08/18 1020    Visit Number  4    Number of Visits  12    Date for PT Re-Evaluation  10/29/18    PT Start Time  1018    PT Stop Time  1100    PT Time Calculation (min)  42 min    Activity Tolerance  Patient tolerated treatment well       Past Medical History:  Diagnosis Date  . Anginal pain (HCC)   . Arthritis    "hands, knees, back, rightt foot" (10/11/2016)  . Asthma   . Chronic bronchitis (HCC)    "get it most years" (10/11/2016)  . Chronic lower back pain   . Coronary artery disease   . High cholesterol   . Lumbar degenerative disc disease 12/14/2013  . Migraine    "daily to weekly" (10/11/2016)  . Pneumonia    "several times; last time was in 03/2016" (10/11/2016)  . Sleep apnea   . Stress fracture of foot 12/14/2013   "right"    Past Surgical History:  Procedure Laterality Date  . BILATERAL CARPAL TUNNEL RELEASE Bilateral   . CORONARY ANGIOPLASTY WITH STENT PLACEMENT  10/11/2016  . CORONARY STENT INTERVENTION N/A 10/11/2016   Procedure: CORONARY STENT INTERVENTION;  Surgeon: Runell GessBerry, Jonathan J, MD;  [facility];  Service: Cardiovascular;  Laterality: N/A;  prox RCA  . KNEE ARTHROSCOPY Left 1978  . KNEE CARTILAGE SURGERY Left   . LEFT HEART CATH AND CORONARY ANGIOGRAPHY N/A 10/11/2016   Procedure: LEFT HEART CATH AND CORONARY ANGIOGRAPHY;  Surgeon: Runell GessBerry, Jonathan J, MD;  Location: [facility];  Service: Cardiovascular;  Laterality: N/A;    There were no vitals filed for this visit.  Subjective Assessment - 10/08/18 1021    Subjective  Sore today from helping wife. he is doing the  household chores as well as the yard work.  Patient reports that he has had bilat shoulder pain for a while. Lt shoulder has been hurting for the past several months. He has no known injury. He is unable to sleep on the Lt side. Certain positions hurt - stretching across the body or lifting arm up to side.    Currently in Pain?  Yes    Pain Score  6     Pain Location  Back    Pain Orientation  Lower;Right;Left    Pain Descriptors / Indicators  Aching    Pain Type  Chronic pain    Pain Score  6    Pain Location  Shoulder    Pain Orientation  Left    Pain Descriptors / Indicators  Aching    Pain Type  Acute pain;Chronic pain    Pain Radiating Towards  just below elbow    Pain Onset  More than a month ago    Pain Frequency  Constant    Aggravating Factors   reaching across body or reaching out to the side; lying on Lt side; lifting; certain positions of hand or arm    Pain Relieving Factors  rest; avoiding stress on the arm         [facility] PT Assessment - 10/08/18 0001  Assessment   Medical Diagnosis  DDD Lumbar spine; Lt shoulder pain     Referring Provider (PT)  Dr Benjamin Stainhekkekandam     Onset Date/Surgical Date  01/29/18   symptoms are chronic in nature x 15 yrs    Hand Dominance  Right    Next MD Visit  10/01/2018    Prior Therapy  yes       Prior Function   Level of Independence  Independent    Vocation  Full time employment   [facility]BP for 10/13/2018   Vocation Requirements  admissions director [employer]     Leisure  yard work; used to Education administratorplay golf; some cleaning/light household chores       Sensation   Additional Comments  WFL's per pt report       Posture/Postural Control   Posture Comments  head forward; shoudlers rounded; sits slumped in chair with rounded lumbar spine - standing posture with wt shifted to the Lt in walking boot Rt LE       AROM   Right Shoulder Extension  52 Degrees    Right Shoulder Flexion  149 Degrees    Right Shoulder ABduction  146 Degrees     Right Shoulder Internal Rotation  28 Degrees    Right Shoulder External Rotation  94 Degrees    Left Shoulder Extension  48 Degrees    Left Shoulder Flexion  128 Degrees    Left Shoulder ABduction  131 Degrees   pain   Left Shoulder Internal Rotation  15 Degrees   pain   Left Shoulder External Rotation  90 Degrees   pain     Strength   Right/Left Shoulder  --   weakness noed in middle and lower trap bilat    Right Shoulder Flexion  5/5    Right Shoulder Extension  5/5    Right Shoulder ABduction  5/5    Right Shoulder Internal Rotation  5/5    Right Shoulder External Rotation  5/5    Right Shoulder Horizontal ABduction  4+/5    Left Shoulder Flexion  5/5    Left Shoulder Extension  5/5    Left Shoulder ABduction  5/5    Left Shoulder Internal Rotation  5/5    Left Shoulder External Rotation  5/5    Left Shoulder Horizontal ABduction  4/5      Palpation   Palpation comment  muscular tightness bilat lumbar musculature - paraspinals; QL; lats; Rt > Lt pirifromis/gluts  - muscular tightness noted through the pecs; upper trap; leveator; teres; cervical musculature                    [facility] Adult PT Treatment/Exercise - 10/08/18 0001      Shoulder Exercises: Standing   Other Standing Exercises  axial extension 10 sec x 10 ; scap squeeze 10 sec x 10; L's x 10; W's x 10 with swim noodle       Shoulder Exercises: Stretch   Other Shoulder Stretches  doorway stretch 30 sec x 2 reps each position              PT Education - 10/08/18 1041    Education Details  HEP postural correction; shoulder function    Person(s) Educated  Patient    Methods  Explanation;Demonstration;Tactile cues;Verbal cues;Handout    Comprehension  Verbalized understanding;Returned demonstration;Verbal cues required;Tactile cues required          PT Long Term Goals -  10/08/18 1057      PT LONG TERM GOAL #1   Title  Improve trunk mobility to 60-70% with no change in pain level    Time   10    Period  Weeks    Status  Revised    Target Date  11/19/18      PT LONG TERM GOAL #2   Title  Increase LE and core strength with patient to tolerate 25-30 min of core stabilization exercises with minimal to no change in pain level    Time  10    Period  Weeks    Status  Revised    Target Date  11/19/18      PT LONG TERM GOAL #3   Title  Patient to demonstrate correct body mechanics for positional changes and lifting    Time  10    Period  Weeks    Status  Revised    Target Date  11/19/18      PT LONG TERM GOAL #4   Title  Independent in HEP    Time  10    Period  Weeks    Status  Revised    Target Date  11/19/18      PT LONG TERM GOAL #5   Title  Improve FOTO to </= 51% limitation    Time  10    Period  Weeks    Status  Revised    Target Date  11/19/18      PT LONG TERM GOAL #6   Title  Improve cervical and thoracic posture and alignment wit hpatient to demonstrate improved upright posture with posterior shoulder girdle engaged    Time  6    Period  Weeks    Status  New    Target Date  11/19/18      PT LONG TERM GOAL #7   Title  Increase middle and lower trap strength to 5/5 thus improving shoulder mechanics/functioin with decrease in pain    Time  6    Period  Weeks    Status  New    Target Date  11/19/18            Plan - 10/08/18 1020    Clinical Impression Statement  Patient seen today for evaluation and treatment of Lt shoulder pain with symptoms present for the past 2 months and recurrent in nature. Patient has no known injury. He has poor posture and alignment; limited Lt > Rt shoulder ROM; decreased strength Lt > Rt middle and lower trap; muscular tightness to palpation; limited functional activities and pain on a daily basis. patient will benefit from PT to address problems identified. He would like to treat LBp and Lt shoulder pain in the same visit. Discussed the fact that we will work to address multiple body systems in the visit.     Stability/Clinical Decision Making  Stable/Uncomplicated    Rehab Potential  Fair    PT Frequency  1x / week    PT Duration  6 weeks    PT Treatment/Interventions  Patient/family education;ADLs/Self Care Home Management;Cryotherapy;Electrical Stimulation;Iontophoresis 4mg /ml Dexamethasone;Moist Heat;Ultrasound;Traction;Manual techniques;Dry needling;Neuromuscular re-education;Therapeutic activities;Therapeutic exercise;Balance training    PT Next Visit Plan  review HEP; assess response to DN lumbar and hip musculature (Rt > Lt); core stabilization program; add Lt shoulder dysfunction to treatment progress with TB exercises for upper and lower core    PT Home Exercise Plan  [id number]   HEP VHI and medbridge    Consulted and  Agree with Plan of Care  Patient       Patient will benefit from skilled therapeutic intervention in order to improve the following deficits and impairments:  Pain, Increased fascial restricitons, Increased muscle spasms, Hypomobility, Decreased strength, Decreased range of motion, Decreased mobility, Decreased activity tolerance  Visit Diagnosis: DDD (degenerative disc disease), lumbar - Plan: PT plan of care cert/re-cert  Chronic bilateral low back pain without sciatica - Plan: PT plan of care cert/re-cert  Other symptoms and signs involving the musculoskeletal system - Plan: PT plan of care cert/re-cert  Muscle weakness (generalized) - Plan: PT plan of care cert/re-cert  Acute pain of left shoulder - Plan: PT plan of care cert/re-cert     Problem List Patient Active Problem List   Diagnosis Date Noted  . Impingement syndrome, shoulder, left 10/01/2018  . Tick bite 08/06/2018  . Controlled type 2 diabetes mellitus without complication, without long-term current use of insulin ([place]) 01/16/2018  . Benign essential hypertension 01/14/2018  . Sleep apnea   . High cholesterol   . CAD in native artery   . Unstable angina ([place])   . Subacromial bursitis of right  shoulder joint 12/31/2016  . 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 Osteoarthritis of left patellofemoral joint 12/19/2016  . Forgetfulness 12/19/2016  . Palpitations 11/02/2016  . COPD mixed type ([place]) 10/16/2016  . Diastolic heart failure of unknown etiology ([place]) 10/11/2016  . Coronary artery calcification 09/29/2016  . Dyslipidemia 09/29/2016  . Former smoker 09/29/2016  . Presence of stent in coronary artery in patient with coronary artery disease 09/26/2016  . Annual physical exam 09/03/2016  . Metatarsalgia of right foot 06/28/2016  . Pain of right sacroiliac joint 03/08/2016  . Primary osteoarthritis of right hip 05/30/2015  . Memory loss 12/13/2014  . Osteoarthritis of right glenohumeral joint 01/25/2014  . Migraine 12/14/2013  . Lumbar degenerative disc disease 12/14/2013  . Primary osteoarthritis of right foot 12/14/2013  . Seasonal allergies 12/14/2013  . Stress fracture of foot 12/14/2013  . DDD (degenerative disc disease), cervical 08/11/2008    Celyn Nilda Simmer PT, MPH  10/08/2018, 11:02 AM  [facility] [place], [place], [address] Phone: [phone]   Fax:  [phone]  Name: Humberto Addo MRN: [id number] Date of Birth: 11/11/1955

## 2018-10-08 NOTE — Patient Instructions (Signed)
Access Code: [email or web address]  URL: [email or web address]  Date: 10/08/2018  Prepared by: Gillermo Murdoch   Exercises  Prone Press Up - 3 reps - 1 sets - 30 sec hold - 2x daily - 7x weekly  Supine Transversus Abdominis Bracing - Hands on Stomach - 10 reps - 1 sets - 10 sec hold - 4-5x daily - 7x weekly  Supine Hip Flexion - 10 reps - 1-3 sets - 1x daily - 7x weekly  Dead Bug - 10 reps - 1-3 sets - 1x daily - 7x weekly  Supine Bridge - 10 reps - 1-3 sets - 5 sec hold - 1x daily - 7x weekly  Hooklying Isometric Clamshell - 10 reps - 1-3 sets - 2-3 sec hold - 1x daily - 7x weekly  Sit to Stand - 10 reps - 1-3 sets - 30 sec hold - 2x daily - 7x weekly  Seated Cervical Retraction - 10 reps - 1 sets - 3x daily - 7x weekly  Standing Scapular Retraction - 10 reps - 1 sets - 10 hold - 3x daily - 7x weekly  Shoulder External Rotation and Scapular Retraction - 10 reps - 1 sets - hold - 3x daily - 7x weekly  Seated Shoulder W - 10 reps - 1 sets - 30 sec hold - 2x daily - 7x weekly  Doorway Pec Stretch at 60 Degrees Abduction - 3 reps - 1 sets - 3x daily - 7x weekly  Doorway Pec Stretch at 90 Degrees Abduction - 3 reps - 1 sets - 30 seconds hold - 3x daily - 7x weekly  Doorway Pec Stretch at 120 Degrees Abduction - 3 reps - 1 sets - 30 second hold hold - 3x daily - 7x weekly

## 2018-10-09 ENCOUNTER — Ambulatory Visit: Payer: BC Managed Care – PPO | Admitting: Sports Medicine

## 2018-10-09 ENCOUNTER — Other Ambulatory Visit: Payer: Self-pay | Admitting: *Deleted

## 2018-10-09 ENCOUNTER — Other Ambulatory Visit: Payer: Self-pay

## 2018-10-09 DIAGNOSIS — E785 Hyperlipidemia, unspecified: Secondary | ICD-10-CM

## 2018-10-10 ENCOUNTER — Other Ambulatory Visit: Payer: Self-pay

## 2018-10-10 DIAGNOSIS — E785 Hyperlipidemia, unspecified: Secondary | ICD-10-CM

## 2018-10-14 ENCOUNTER — Telehealth (INDEPENDENT_AMBULATORY_CARE_PROVIDER_SITE_OTHER): Payer: BC Managed Care – PPO | Admitting: Internal Medicine

## 2018-10-14 ENCOUNTER — Encounter: Payer: Self-pay | Admitting: Internal Medicine

## 2018-10-14 DIAGNOSIS — E119 Type 2 diabetes mellitus without complications: Secondary | ICD-10-CM

## 2018-10-14 DIAGNOSIS — E785 Hyperlipidemia, unspecified: Secondary | ICD-10-CM

## 2018-10-14 DIAGNOSIS — I10 Essential (primary) hypertension: Secondary | ICD-10-CM | POA: Diagnosis not present

## 2018-10-14 DIAGNOSIS — I2510 Atherosclerotic heart disease of native coronary artery without angina pectoris: Secondary | ICD-10-CM

## 2018-10-14 DIAGNOSIS — E782 Mixed hyperlipidemia: Secondary | ICD-10-CM

## 2018-10-14 DIAGNOSIS — I6529 Occlusion and stenosis of unspecified carotid artery: Secondary | ICD-10-CM | POA: Diagnosis not present

## 2018-10-14 NOTE — Patient Instructions (Signed)
Medication Instructions:  Your physician recommends that you continue on your current medications as directed. Please refer to the Current Medication list given to you today.  If you need a refill on your cardiac medications before your next appointment, please call your pharmacy.   Lab work: FASTING lab work to check cholesterol - please complete @ Dr. Lysbeth Penner office  If you have labs (blood work) drawn today and your tests are completely normal, you will receive your results only by: Marland Kitchen MyChart Message (if you have MyChart) OR . A paper copy in the mail If you have any lab test that is abnormal or we need to change your treatment, we will call you to review the results.  Testing/Procedures: Carotid Doppler - to be scheduled @ Dr. Lysbeth Penner office  Follow-Up: At [facility], you and your health needs are our priority.  As part of our continuing mission to provide you with exceptional heart care, we have created designated Provider Care Teams.  These Care Teams include your primary Cardiologist (physician) and Advanced Practice Providers (APPs -  Physician Assistants and Nurse Practitioners) who all work together to provide you with the care you need, when you need it. You will need a follow up appointment in 12 months.  Please call our office 2 months in advance to schedule this appointment.  You may see Pixie Casino, MD or one of the following Advanced Practice Providers on your designated Care Team: [place], [place] . Fabian Sharp, PA-C  Any Other Special Instructions Will Be Listed Below (If Applicable).

## 2018-10-14 NOTE — Progress Notes (Signed)
Virtual Visit via Video Note   This visit type was conducted due to national recommendations for restrictions regarding the COVID-19 Pandemic (e.g. social distancing) in an effort to limit this patient's exposure and mitigate transmission in our community.  Due to his co-morbid illnesses, this patient is at least at moderate risk for complications without adequate follow up.  This format is felt to be most appropriate for this patient at this time.  All issues noted in this document were discussed and addressed.  A limited physical exam was performed with this format.  Please refer to the patient's chart for his consent to telehealth for [facility].   Evaluation Performed:  Doxy.me video visit  Date:  10/14/2018   ID:  Justice Rocher, DOB [birth year], MRN [id number]  Patient Location:  [address] [place] [address]  Provider location:   [address] [place], [place] [address]  PCP:  Monica Becton, MD  Cardiologist:  Chrystie Nose, MD Electrophysiologist:  None   Chief Complaint:  Back pain, chest wall pain  History of Present Illness:    Fon Rijo is a 63 y.o. male who presents via audio/video conferencing for a telehealth visit today.  Mr. Osen is a pleasant 63 year old male with a history of coronary artery disease and PCI to the right coronary artery in 2018.  He also had some residual LAD disease.  This is been managed medically.  He has been struggling with shoulder and osteoarthritic pain over the last several years as well as back pain.  He was also recently noted to have type 2 diabetes with hemoglobin A1c of 7.4.  He has been started on metformin and Ozempic and is lost significant weight.  Hemoglobin A1c as of May has come down to 6.0.  Cholesterol had risen with LDL over 70, tested 9 months ago.  He also had cervical spine films which showed calcification of the left cervical carotid artery.  The patient does not have  symptoms concerning for COVID-19 infection (fever, chills, cough, or new SHORTNESS OF BREATH).    Prior CV studies:   The following studies were reviewed today:  Chart reviewed  PMHx:  Past Medical History:  Diagnosis Date  . Anginal pain (HCC)   . Arthritis    "hands, knees, back, rightt foot" (10/11/2016)  . Asthma   . Chronic bronchitis (HCC)    "get it most years" (10/11/2016)  . Chronic lower back pain   . Coronary artery disease   . High cholesterol   . Lumbar degenerative disc disease 12/14/2013  . Migraine    "daily to weekly" (10/11/2016)  . Pneumonia    "several times; last time was in 03/2016" (10/11/2016)  . Sleep apnea   . Stress fracture of foot 12/14/2013   "right"    Past Surgical History:  Procedure Laterality Date  . BILATERAL CARPAL TUNNEL RELEASE Bilateral   . CORONARY ANGIOPLASTY WITH STENT PLACEMENT  10/11/2016  . CORONARY STENT INTERVENTION N/A 10/11/2016   Procedure: CORONARY STENT INTERVENTION;  Surgeon: Runell Gess, MD;  [facility];  Service: Cardiovascular;  Laterality: N/A;  prox RCA  . KNEE ARTHROSCOPY Left 1978  . KNEE CARTILAGE SURGERY Left   . LEFT HEART CATH AND CORONARY ANGIOGRAPHY N/A 10/11/2016   Procedure: LEFT HEART CATH AND CORONARY ANGIOGRAPHY;  Surgeon: Runell Gess, MD;  [facility];  Service: Cardiovascular;  Laterality: N/A;    FAMHx:  Family History  Problem Relation Age of Onset  . Brain cancer Father   . Alcoholism Mother   . Diabetes Mother        father   . Lung cancer Mother   . Cancer Unknown   . Cancer - Prostate Brother     SOCHx:   reports that he quit smoking about 2 years ago. His smoking use included cigarettes. He has a 14.52 pack-year smoking history. He has never used smokeless tobacco. He reports current alcohol use of about 6.0 standard drinks of alcohol per week. He reports that he does not use drugs.  ALLERGIES:  Allergies  Allergen Reactions  .  Azithromycin Other (See Comments)    Tingling/swelling in lips/tongue  . E-Mycin [Erythromycin] Nausea Only    GI upset     MEDS:  Current Meds  Medication Sig  . acetaminophen (TYLENOL) 325 MG tablet Take 650 mg by mouth every 6 (six) hours as needed.  Marland Kitchen albuterol (PROVENTIL HFA;VENTOLIN HFA) 108 (90 Base) MCG/ACT inhaler INHALE 2 PUFFS BY MOUTH EVERY 6 HOURS AS NEEDED FOR WHEEZING OR SHORTNESS OF BREATH.  Marland Kitchen albuterol (PROVENTIL) (2.5 MG/3ML) 0.083% nebulizer solution Take 3 mLs (2.5 mg total) by nebulization every 6 (six) hours as needed for wheezing or shortness of breath.  . AMBULATORY NON FORMULARY MEDICATION Single glucometer with lancets, test strips  . aspirin EC 81 MG tablet Take 1 tablet (81 mg total) by mouth daily. (Patient taking differently: Take 81 mg by mouth every evening. )  . atorvastatin (LIPITOR) 80 MG tablet TAKE ONE TABLET BY MOUTH EVERY NIGHT AT BEDTIME  . cyclobenzaprine (FLEXERIL) 10 MG tablet One half tab PO qHS, then increase gradually to one tab TID.  Marland Kitchen Diclofenac Sodium 2 % SOLN Place 2 sprays onto the skin 2 (two) times daily.  . fluticasone (FLONASE) 50 MCG/ACT nasal spray One spray in each nostril twice a day, use left hand for right nostril, and right hand for left nostril.  . meloxicam (MOBIC) 15 MG tablet One tab PO qAM with breakfast for 2 weeks, then daily prn pain.  . metFORMIN (GLUCOPHAGE XR) 500 MG 24 hr tablet Take 1 tablet (500 mg total) by mouth daily with breakfast.  . metoprolol succinate (TOPROL-XL) 25 MG 24 hr tablet TAKE ONE TABLET BY MOUTH DAILY  . montelukast (SINGULAIR) 10 MG tablet TAKE ONE TABLET BY MOUTH EVERY NIGHT AT BEDTIME  . nitroGLYCERIN (NITROSTAT) 0.4 MG SL tablet Place 1 tablet (0.4 mg total) under the tongue every 5 (five) minutes as needed for chest pain.  . Semaglutide, 1 MG/DOSE, (OZEMPIC, 1 MG/DOSE,) 2 MG/1.5ML SOPN Inject 1 mg into the skin once a week.  . Semaglutide,0.25 or 0.5MG /DOS, (OZEMPIC, 0.25 OR 0.5 MG/DOSE,) 2  MG/1.5ML SOPN Inject 0.5 mg into the skin once a week. Inject 0.25 mg subcutaneous weekly for 4 weeks then inject 0.5 mg subcutaneous weekly  . SUMAtriptan (IMITREX) 100 MG tablet 1 tab as needed for migraine, may repeat x1 after 2 hours if needed  . SUMAtriptan-naproxen (TREXIMET) 85-500 MG tablet One-half to one tab PO at the first sign of migraine, may repeat x1 in 2 hours if headache still present.  . traMADol (ULTRAM) 50 MG tablet Take 1 tablet (50 mg total) by mouth every 8 (eight) hours as needed for moderate pain. Maximum 6 tabs per day.     ROS: Pertinent items noted in HPI and remainder of comprehensive ROS otherwise negative.  Labs/Other Tests and Data Reviewed:    Recent Labs: 01/15/2018:  ALT 23; BUN 12; Creat 1.01; Hemoglobin 14.7; Platelets 276; Potassium 4.5; Sodium 143; TSH 1.83   Recent Lipid Panel Lab Results  Component Value Date/Time   CHOL 162 01/15/2018 08:16 AM   TRIG 148 01/15/2018 08:16 AM   HDL 55 01/15/2018 08:16 AM   CHOLHDL 2.9 01/15/2018 08:16 AM   LDLCALC 82 01/15/2018 08:16 AM    Wt Readings from Last 3 Encounters:  10/01/18 186 lb (84.4 kg)  09/03/18 188 lb (85.3 kg)  08/06/18 187 lb (84.8 kg)     Exam:    Vital Signs:  There were no vitals taken for this visit.   General appearance: alert and no distress Lungs: No audible respiratory difficulty Abdomen: Mildly overweight Extremities: extremities normal, atraumatic, no cyanosis or edema Skin: Skin color, texture, turgor normal. No rashes or lesions Neurologic: Mental status: Alert, oriented, thought content appropriate Psych: Pleasant  ASSESSMENT & PLAN:    1. CAD s/p PCI to the RCA with DES (09/2016) 2. Tobacco abuse -quit 3. Calcification of the LAD 4. Dyslipidemia 5. Left carotid calcification 6. DM2- controlled, not on insulin, A1c 6.0  [name] denies any anginal chest pain.  He has had some short-lived, sharp left upper chest pain which lasted only a few seconds and I suspect  may be related to his shoulder pain.  He was noted to have left carotid artery calcification.  We will plan to get carotid Dopplers to exclude any obstructive carotid disease.  He has dyslipidemia with a rising LDL however recently has had about 20 pound weight loss after being diagnosed with diabetes and making dietary changes as well as starting metformin and Ozempic.  We will plan to repeat fasting lipid profile as well.  Follow-up with me annually or sooner as necessary.  COVID-19 Education: The signs and symptoms of COVID-19 were discussed with the patient and how to seek care for testing (follow up with PCP or arrange E-visit).  The importance of social distancing was discussed today.  Patient Risk:   After full review of this patients clinical status, I feel that they are at least moderate risk at this time.  Time:   Today, I have spent 25 minutes with the patient with telehealth technology discussing dyslipidemia, carotid calcification, CAD, DM2.     Medication Adjustments/Labs and Tests Ordered: Current medicines are reviewed at length with the patient today.  Concerns regarding medicines are outlined above.   Tests Ordered: No orders of the defined types were placed in this encounter.   Medication Changes: No orders of the defined types were placed in this encounter.   Disposition:  in 1 year(s)  Chrystie NoseKenneth C. Lilli Dewald, MD, [facility] HospitalFACC, FACP  [place]  [facility]  Medical Director of the Advanced Lipid Disorders &  Cardiovascular Risk Reduction Clinic Diplomate of the American Board of Clinical Lipidology Attending Cardiologist  Direct Dial: [phone]  Fax: [phone]  Website:  [email or web address]  Chrystie NoseKenneth C Paidyn Mcferran, MD  10/14/2018 8:33 AM

## 2018-10-15 ENCOUNTER — Ambulatory Visit (INDEPENDENT_AMBULATORY_CARE_PROVIDER_SITE_OTHER): Payer: BC Managed Care – PPO | Admitting: Rehabilitative and Restorative Service Providers"

## 2018-10-15 ENCOUNTER — Encounter: Payer: Self-pay | Admitting: Rehabilitative and Restorative Service Providers"

## 2018-10-15 ENCOUNTER — Other Ambulatory Visit: Payer: Self-pay

## 2018-10-15 DIAGNOSIS — R29898 Other symptoms and signs involving the musculoskeletal system: Secondary | ICD-10-CM

## 2018-10-15 DIAGNOSIS — M545 Low back pain: Secondary | ICD-10-CM | POA: Diagnosis not present

## 2018-10-15 DIAGNOSIS — M6281 Muscle weakness (generalized): Secondary | ICD-10-CM | POA: Diagnosis not present

## 2018-10-15 DIAGNOSIS — G8929 Other chronic pain: Secondary | ICD-10-CM

## 2018-10-15 DIAGNOSIS — M5136 Other intervertebral disc degeneration, lumbar region: Secondary | ICD-10-CM | POA: Diagnosis not present

## 2018-10-15 DIAGNOSIS — M51369 Other intervertebral disc degeneration, lumbar region without mention of lumbar back pain or lower extremity pain: Secondary | ICD-10-CM

## 2018-10-15 DIAGNOSIS — M25512 Pain in left shoulder: Secondary | ICD-10-CM

## 2018-10-15 NOTE — Therapy (Signed)
[facility] [place], [place], [address] Phone: [phone]   Fax:  [phone]  Physical Therapy Treatment  Patient Details  Name: Joseph Jacobs MRN: [id number] Date of Birth: [birth year] Referring Provider (PT): Dr Dianah Field    Encounter Date: 10/15/2018  PT End of Session - 10/15/18 1105    Visit Number  5    Number of Visits  12    Date for PT Re-Evaluation  10/29/18    PT Start Time  1103    PT Stop Time  1145    PT Time Calculation (min)  42 min    Activity Tolerance  Patient tolerated treatment well       Past Medical History:  Diagnosis Date  . Anginal pain ([facility])   . Arthritis    "hands, knees, back, rightt foot" (10/11/2016)  . Asthma   . Chronic bronchitis ([place])    "get it most years" (10/11/2016)  . Chronic lower back pain   . Coronary artery disease   . High cholesterol   . Lumbar degenerative disc disease 12/14/2013  . Migraine    "daily to weekly" (10/11/2016)  . Pneumonia    "several times; last time was in 03/2016" (10/11/2016)  . Sleep apnea   . Stress fracture of foot 12/14/2013   "right"    Past Surgical History:  Procedure Laterality Date  . BILATERAL CARPAL TUNNEL RELEASE Bilateral   . CORONARY ANGIOPLASTY WITH STENT PLACEMENT  10/11/2016  . CORONARY STENT INTERVENTION N/A 10/11/2016   Procedure: CORONARY STENT INTERVENTION;  Surgeon: Lorretta Harp, MD;  Location: [facility];  Service: Cardiovascular;  Laterality: N/A;  prox RCA  . KNEE ARTHROSCOPY Left 1978  . KNEE CARTILAGE SURGERY Left   . LEFT HEART CATH AND CORONARY ANGIOGRAPHY N/A 10/11/2016   Procedure: LEFT HEART CATH AND CORONARY ANGIOGRAPHY;  Surgeon: Lorretta Harp, MD;  Location: [facility];  Service: Cardiovascular;  Laterality: N/A;    There were no vitals filed for this visit.  Subjective Assessment - 10/15/18 1105    Subjective  Patient reports that he had a bad day yesterday -  may have been busier with running wife around and he had a cardiology appointment. Pt reports that he has some blockage of cornary arteries. Will have more testing to determine treatment.    Currently in Pain?  Yes    Pain Score  5     Pain Location  Back    Pain Orientation  Lower;Right;Left    Pain Descriptors / Indicators  Aching    Pain Score  6    Pain Location  Shoulder    Pain Orientation  Left    Pain Descriptors / Indicators  Aching                       [facility] Adult PT Treatment/Exercise - 10/15/18 0001      Lumbar Exercises: Stretches   Press Ups  10 reps   2-3 sec hold    Quad Stretch  Right;Left;2 reps;30 seconds   prone with strap    Piriformis Stretch  Right;Left;2 reps;30 seconds   supine travell     Lumbar Exercises: Aerobic   Nustep  L5 x 6.5 min       Lumbar Exercises: Standing   Lifting  From 12";10 reps   hinging from hips lifting from btn legs core tight    Lifting Weights (lbs)  5  Scapular Retraction  Strengthening;Both;20 reps;Theraband    Theraband Level (Scapular Retraction)  Level 1 (Yellow)    Row  Strengthening;Both;20 reps;Theraband    Theraband Level (Row)  Level 3 (Green)    Shoulder Extension  Strengthening;Both;20 reps;Theraband    Theraband Level (Shoulder Extension)  Level 3 (Green)      Lumbar Exercises: Seated   Sit to Stand  10 reps   core tight divided into 2 sets of 5            PT Education - 10/15/18 1121    Education Details  HEP    Person(s) Educated  Patient    Methods  Explanation;Demonstration;Tactile cues;Verbal cues;Handout    Comprehension  Verbalized understanding;Returned demonstration;Verbal cues required;Tactile cues required          PT Long Term Goals - 10/08/18 1057      PT LONG TERM GOAL #1   Title  Improve trunk mobility to 60-70% with no change in pain level    Time  10    Period  Weeks    Status  Revised    Target Date  11/19/18      PT LONG TERM GOAL #2   Title   Increase LE and core strength with patient to tolerate 25-30 min of core stabilization exercises with minimal to no change in pain level    Time  10    Period  Weeks    Status  Revised    Target Date  11/19/18      PT LONG TERM GOAL #3   Title  Patient to demonstrate correct body mechanics for positional changes and lifting    Time  10    Period  Weeks    Status  Revised    Target Date  11/19/18      PT LONG TERM GOAL #4   Title  Independent in HEP    Time  10    Period  Weeks    Status  Revised    Target Date  11/19/18      PT LONG TERM GOAL #5   Title  Improve FOTO to </= 51% limitation    Time  10    Period  Weeks    Status  Revised    Target Date  11/19/18      PT LONG TERM GOAL #6   Title  Improve cervical and thoracic posture and alignment wit hpatient to demonstrate improved upright posture with posterior shoulder girdle engaged    Time  6    Period  Weeks    Status  New    Target Date  11/19/18      PT LONG TERM GOAL #7   Title  Increase middle and lower trap strength to 5/5 thus improving shoulder mechanics/functioin with decrease in pain    Time  6    Period  Weeks    Status  New    Target Date  11/19/18            Plan - 10/15/18 1105    Clinical Impression Statement  Continued pain in the LB and shoulder as well as increased pain in the cervical spine today. Patient reports compliance with HEP. Has some difficulty with correct technique in clinic requiring review and correction by PT. Added strengthening exercises for posterior shoulder girdle today without difficulty. Continuing efforts to progress with core stabilization and strengthening as patient tolerates. Reassess at next vist for RTD.    Stability/Clinical Decision Making  Stable/Uncomplicated    Rehab Potential  Fair    PT Frequency  1x / week    PT Duration  6 weeks    PT Treatment/Interventions  Patient/family education;ADLs/Self Care Home Management;Cryotherapy;Electrical  Stimulation;Iontophoresis 4mg /ml Dexamethasone;Moist Heat;Ultrasound;Traction;Manual techniques;Dry needling;Neuromuscular re-education;Therapeutic activities;Therapeutic exercise;Balance training    PT Next Visit Plan  review HEP; continue DN lumbar and hip musculature (Rt > Lt) as indicated and patient dictates; core stabilization program; continue to address Lt shoulder dysfunction - TB exercises for upper and lower core    PT Home Exercise Plan  [email or web address]   HEP VHI and medbridge    Consulted and Agree with Plan of Care  Patient       Patient will benefit from skilled therapeutic intervention in order to improve the following deficits and impairments:  Pain, Increased fascial restricitons, Increased muscle spasms, Hypomobility, Decreased strength, Decreased range of motion, Decreased mobility, Decreased activity tolerance  Visit Diagnosis: DDD (degenerative disc disease), lumbar  Chronic bilateral low back pain without sciatica  Other symptoms and signs involving the musculoskeletal system  Muscle weakness (generalized)  Acute pain of left shoulder     Problem List Patient Active Problem List   Diagnosis Date Noted  . Impingement syndrome, shoulder, left 10/01/2018  . Tick bite 08/06/2018  . Controlled type 2 diabetes mellitus without complication, without long-term current use of insulin ([facility]) 01/16/2018  . Benign essential hypertension 01/14/2018  . Sleep apnea   . High cholesterol   . CAD in native artery   . Unstable angina ([facility])   . Subacromial bursitis of right shoulder joint 12/31/2016  . 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 Osteoarthritis of left patellofemoral joint 12/19/2016  . Forgetfulness 12/19/2016  . Palpitations 11/02/2016  . COPD mixed type ([facility]) 10/16/2016  . Diastolic heart failure of unknown etiology ([facility]) 10/11/2016  . Coronary artery calcification 09/29/2016  . Dyslipidemia 09/29/2016  . Former  smoker 09/29/2016  . Presence of stent in coronary artery in patient with coronary artery disease 09/26/2016  . Annual physical exam 09/03/2016  . Metatarsalgia of right foot 06/28/2016  . Pain of right sacroiliac joint 03/08/2016  . Primary osteoarthritis of right hip 05/30/2015  . Memory loss 12/13/2014  . Osteoarthritis of right glenohumeral joint 01/25/2014  . Migraine 12/14/2013  . Lumbar degenerative disc disease 12/14/2013  . Primary osteoarthritis of right foot 12/14/2013  . Seasonal allergies 12/14/2013  . Stress fracture of foot 12/14/2013  . DDD (degenerative disc disease), cervical 08/11/2008    Joellen Tullos Rober Minion PT, MPH  10/15/2018, 11:43 AM  [facility] [address] [place], [place], [address] Phone: [phone]   Fax:  [phone]  Name: Joseph Jacobs MRN: [id number] Date of Birth: [birth year]

## 2018-10-15 NOTE — Patient Instructions (Signed)
Access Code: [email or web address]  URL: [email or web address]  Date: 10/15/2018  Prepared by: Gillermo Murdoch   Exercises  Prone Press Up - 3 reps - 1 sets - 30 sec hold - 2x daily - 7x weekly  Supine Transversus Abdominis Bracing - Hands on Stomach - 10 reps - 1 sets - 10 sec hold - 4-5x daily - 7x weekly  Supine Hip Flexion - 10 reps - 1-3 sets - 1x daily - 7x weekly  Dead Bug - 10 reps - 1-3 sets - 1x daily - 7x weekly  Supine Bridge - 10 reps - 1-3 sets - 5 sec hold - 1x daily - 7x weekly  Hooklying Isometric Clamshell - 10 reps - 1-3 sets - 2-3 sec hold - 1x daily - 7x weekly  Sit to Stand - 10 reps - 1-3 sets - 30 sec hold - 2x daily - 7x weekly  Seated Cervical Retraction - 10 reps - 1 sets - 3x daily - 7x weekly  Standing Scapular Retraction - 10 reps - 1 sets - 10 hold - 3x daily - 7x weekly  Shoulder External Rotation and Scapular Retraction - 10 reps - 1 sets - hold - 3x daily - 7x weekly  Seated Shoulder W - 10 reps - 1 sets - 30 sec hold - 2x daily - 7x weekly  Doorway Pec Stretch at 60 Degrees Abduction - 3 reps - 1 sets - 3x daily - 7x weekly  Doorway Pec Stretch at 90 Degrees Abduction - 3 reps - 1 sets - 30 seconds hold - 3x daily - 7x weekly  Doorway Pec Stretch at 120 Degrees Abduction - 3 reps - 1 sets - 30 second hold hold - 3x daily - 7x weekly  Shoulder External Rotation and Scapular Retraction with Resistance - 10 reps - 3 sets - 2x daily - 7x weekly  Scapular Retraction with Resistance - 10 reps - 3 sets - 2x daily - 7x weekly  Scapular Retraction with Resistance Advanced - 10 reps - 3 sets - 2x daily - 7x weekly

## 2018-10-17 ENCOUNTER — Other Ambulatory Visit: Payer: Self-pay

## 2018-10-17 ENCOUNTER — Ambulatory Visit (HOSPITAL_COMMUNITY)
Admission: RE | Admit: 2018-10-17 | Discharge: 2018-10-17 | Disposition: A | Discharge status: Home / Self Care | Payer: BC Managed Care – PPO | Source: Ambulatory Visit | Attending: Cardiology | Admitting: Cardiology

## 2018-10-17 DIAGNOSIS — I6529 Occlusion and stenosis of unspecified carotid artery: Secondary | ICD-10-CM | POA: Insufficient documentation

## 2018-10-17 LAB — LIPID PANEL
Chol/HDL Ratio: 2.7 ratio (ref 0.0–5.0)
Cholesterol, Total: 107 mg/dL (ref 100–199)
HDL: 40 mg/dL (ref >39)
LDL Chol Calc (NIH): 43 mg/dL (ref 0–99)
Triglycerides: 135 mg/dL (ref 0–149)
VLDL Cholesterol Cal: 24 mg/dL (ref 5–40)

## 2018-10-22 ENCOUNTER — Ambulatory Visit (INDEPENDENT_AMBULATORY_CARE_PROVIDER_SITE_OTHER): Payer: BC Managed Care – PPO | Admitting: Physical Therapy

## 2018-10-22 ENCOUNTER — Encounter: Payer: Self-pay | Admitting: Physical Therapy

## 2018-10-22 ENCOUNTER — Other Ambulatory Visit: Payer: Self-pay

## 2018-10-22 DIAGNOSIS — G8929 Other chronic pain: Secondary | ICD-10-CM

## 2018-10-22 DIAGNOSIS — M6281 Muscle weakness (generalized): Secondary | ICD-10-CM

## 2018-10-22 DIAGNOSIS — R29898 Other symptoms and signs involving the musculoskeletal system: Secondary | ICD-10-CM | POA: Diagnosis not present

## 2018-10-22 DIAGNOSIS — M5136 Other intervertebral disc degeneration, lumbar region: Secondary | ICD-10-CM | POA: Diagnosis not present

## 2018-10-22 DIAGNOSIS — M51369 Other intervertebral disc degeneration, lumbar region without mention of lumbar back pain or lower extremity pain: Secondary | ICD-10-CM

## 2018-10-22 DIAGNOSIS — M545 Low back pain, unspecified: Secondary | ICD-10-CM

## 2018-10-22 NOTE — Therapy (Addendum)
[place] [employer] [place], [place], [address] Phone: [phone]   Fax:  [phone]  Physical Therapy Treatment  Patient Details  Name: Osiah Haring MRN: [id number] Date of Birth: July 24, 1955 Referring Provider (PT): Dr Dianah Field    Encounter Date: 10/22/2018  PT End of Session - 10/22/18 1353    Visit Number  6    Number of Visits  12    Date for PT Re-Evaluation  10/29/18    PT Start Time  1351    PT Stop Time  1435    PT Time Calculation (min)  44 min    Behavior During Therapy  [facility] for tasks assessed/performed       Past Medical History:  Diagnosis Date  . Anginal pain ([place])   . Arthritis    "hands, knees, back, rightt foot" (10/11/2016)  . Asthma   . Chronic bronchitis ([place])    "get it most years" (10/11/2016)  . Chronic lower back pain   . Coronary artery disease   . High cholesterol   . Lumbar degenerative disc disease 12/14/2013  . Migraine    "daily to weekly" (10/11/2016)  . Pneumonia    "several times; last time was in 03/2016" (10/11/2016)  . Sleep apnea   . Stress fracture of foot 12/14/2013   "right"    Past Surgical History:  Procedure Laterality Date  . BILATERAL CARPAL TUNNEL RELEASE Bilateral   . CORONARY ANGIOPLASTY WITH STENT PLACEMENT  10/11/2016  . CORONARY STENT INTERVENTION N/A 10/11/2016   Procedure: CORONARY STENT INTERVENTION;  Surgeon: Lorretta Harp, MD;  Location: [facility];  Service: Cardiovascular;  Laterality: N/A;  prox RCA  . KNEE ARTHROSCOPY Left 1978  . KNEE CARTILAGE SURGERY Left   . LEFT HEART CATH AND CORONARY ANGIOGRAPHY N/A 10/11/2016   Procedure: LEFT HEART CATH AND CORONARY ANGIOGRAPHY;  Surgeon: Lorretta Harp, MD;  Location: [facility];  Service: Cardiovascular;  Laterality: N/A;    There were no vitals filed for this visit.  Subjective Assessment - 10/22/18 1355    Subjective  Pt reports no changes since last visit.  He is  always sore for 2-3 days after therapy appts.    Patient Stated Goals  get approved for ablasion    Currently in Pain?  Yes    Pain Score  6     Pain Location  Back    Pain Orientation  Left;Right;Lower    Pain Descriptors / Indicators  Aching    Aggravating Factors   prolonged sitting and standing, bending, lifting    Pain Relieving Factors  injections, lying flat on back    Pain Score  7    Pain Location  Back    Pain Orientation  Upper   between shoulder blades   Pain Descriptors / Indicators  Aching    Aggravating Factors   lying on Lt side    Pain Relieving Factors  rest, avoiding stress         [facility] PT Assessment - 10/22/18 0001      Assessment   Medical Diagnosis  DDD Lumbar spine; Lt shoulder pain     Referring Provider (PT)  Dr Dianah Field     Onset Date/Surgical Date  01/29/18   symptoms are chronic in nature x 15 yrs    Hand Dominance  Right    Next MD Visit  10/01/2018    Prior Therapy  yes       Observation/Other  Assessments   Focus on Therapeutic Outcomes (FOTO)   69% limited      AROM   Lumbar Flexion  50% pain increased in LB    Lumbar Extension  WNL     Lumbar - Right Side Bend  50% pain     Lumbar - Left Side Bend  50% pain    Lumbar - Right Rotation  40% pulling     Lumbar - Left Rotation  40% pulling        [facility] Adult PT Treatment/Exercise - 10/22/18 0001      Lumbar Exercises: Stretches   Passive Hamstring Stretch  Right;Left;2 reps;30 seconds   seated with straight back   Hip Flexor Stretch  Right;Left;2 reps;30 seconds   seated   Press Ups  --   verbally reviewed.    Piriformis Stretch  Right;Left;2 reps;30 seconds   seated   Other Lumbar Stretch Exercise  seated thoracic ext over back of chair x 10 sec x 3 reps       Lumbar Exercises: Aerobic   Nustep  L4: 7 min    PTA present to discuss progress     Lumbar Exercises: Standing   Other Standing Lumbar Exercises  reviewed body mechanics for lifting item off of floor via deep squat  with core engaged and golfers lift - pt returned demo of each      Lumbar Exercises: Seated   Sit to Stand  5 reps   2 sets, staggered stance trial.    Sit to Stand Limitations  core engaged.     Other Seated Lumbar Exercises  scap squeeze x 10 sec x 5 reps      Shoulder Exercises: Stretch   Table Stretch - Flexion  5 reps   10 sec hold.    Other Shoulder Stretches  mid level doorway stretch x 2 reps of 20 sec; switched to corner pec stretch with improved tolerance x 20 sec x 3 reps              PT Education - 10/22/18 1632    Education Details  HEP    Person(s) Educated  Patient    Methods  Explanation;Demonstration;Handout    Comprehension  Verbalized understanding;Returned demonstration          PT Long Term Goals - 10/22/18 1432      PT LONG TERM GOAL #1   Title  Improve trunk mobility to 60-70% with no change in pain level    Baseline  no change in ROM - 10/22/18    Time  10    Period  Weeks    Status  On-going      PT LONG TERM GOAL #2   Title  Increase LE and core strength with patient to tolerate 25-30 min of core stabilization exercises with minimal to no change in pain level    Baseline  Pt able to tolerate 20 min of exercises prior to increase in pain.  -10-22-18    Time  10    Period  Weeks    Status  On-going      PT LONG TERM GOAL #3   Title  Patient to demonstrate correct body mechanics for positional changes and lifting    Baseline  Pt demonstrated proper lifting technique; continues to utilize poor mechanics for positional changes. 10/22/18    Time  10    Period  Weeks    Status  Partially Met      PT LONG TERM GOAL #  4   Title  Independent in HEP    Time  10    Period  Weeks    Status  On-going      PT LONG TERM GOAL #5   Title  Improve FOTO to </= 51% limitation    Time  10    Period  Weeks    Status  On-going      PT LONG TERM GOAL #6   Title  Improve cervical and thoracic posture and alignment with patient to demonstrate improved  upright posture with posterior shoulder girdle engaged    Baseline  frequent cues required for improved posture 10/22/18    Time  6    Period  Weeks    Status  On-going      PT LONG TERM GOAL #7   Title  Increase middle and lower trap strength to 5/5 thus improving shoulder mechanics/functioin with decrease in pain    Time  6    Period  Weeks    Status  On-going            Plan - 10/22/18 1624    Clinical Impression Statement  Pt continues to report persistent pain in back and upper thoracic, limiting his tolerance for exercises.  Modified some of the stretches to encourage compliance of HEP.  Pt's FOTO score worsened to 69% limitation.  Pt has made very gradual progress towards goals. Pt requests to hold therapy until after upcoming MD appt.    Stability/Clinical Decision Making  Stable/Uncomplicated    Rehab Potential  Fair    PT Frequency  1x / week    PT Duration  6 weeks    PT Treatment/Interventions  Patient/family education;ADLs/Self Care Home Management;Cryotherapy;Electrical Stimulation;Iontophoresis 71m/ml Dexamethasone;Moist Heat;Ultrasound;Traction;Manual techniques;Dry needling;Neuromuscular re-education;Therapeutic activities;Therapeutic exercise;Balance training    PT Next Visit Plan  will hold therapy per pt request.  will continue spine stabilization program/back care education/ flexibility upon his return.    PT Home Exercise Plan  [email or web address]   HEP VHI and medbridge    Consulted and Agree with Plan of Care  Patient       Patient will benefit from skilled therapeutic intervention in order to improve the following deficits and impairments:  Pain, Increased fascial restricitons, Increased muscle spasms, Hypomobility, Decreased strength, Decreased range of motion, Decreased mobility, Decreased activity tolerance  Visit Diagnosis: DDD (degenerative disc disease), lumbar  Chronic bilateral low back pain without sciatica  Other symptoms and signs involving the  musculoskeletal system  Muscle weakness (generalized)     Problem List Patient Active Problem List   Diagnosis Date Noted  . Impingement syndrome, shoulder, left 10/01/2018  . Tick bite 08/06/2018  . Controlled type 2 diabetes mellitus without complication, without long-term current use of insulin ([facility] 01/16/2018  . Benign essential hypertension 01/14/2018  . Sleep apnea   . High cholesterol   . CAD in native artery   . Unstable angina ([facility]   . Subacromial bursitis of right shoulder joint 12/31/2016  . 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 Osteoarthritis of left patellofemoral joint 12/19/2016  . Forgetfulness 12/19/2016  . Palpitations 11/02/2016  . COPD mixed type ([facility] 10/16/2016  . Diastolic heart failure of unknown etiology ([facility] 10/11/2016  . Coronary artery calcification 09/29/2016  . Dyslipidemia 09/29/2016  . Former smoker 09/29/2016  . Presence of stent in coronary artery in patient with coronary artery disease 09/26/2016  . Annual physical exam 09/03/2016  . Metatarsalgia of right foot  06/28/2016  . Pain of right sacroiliac joint 03/08/2016  . Primary osteoarthritis of right hip 05/30/2015  . Memory loss 12/13/2014  . Osteoarthritis of right glenohumeral joint 01/25/2014  . Migraine 12/14/2013  . Lumbar degenerative disc disease 12/14/2013  . Primary osteoarthritis of right foot 12/14/2013  . Seasonal allergies 12/14/2013  . Stress fracture of foot 12/14/2013  . DDD (degenerative disc disease), cervical 08/11/2008   Kerin Perna, PTA 10/22/18 4:36 PM  [facility] [place]  [place], [place], [address] Phone: [phone]   Fax:  [phone]  Name: Lancer Thurner MRN: [id number] Date of Birth: 06/29/1955  PHYSICAL THERAPY DISCHARGE SUMMARY  Visits from Start of Care: 6  Current functional level related to goals / functional  outcomes: See last progress note for discharge status    Remaining deficits: Continued chronic pain and decreased activity level; inconsistent exercise   Education / Equipment: HEP  Plan: Patient agrees to discharge.  Patient goals were partially met. Patient is being discharged due to                                                     ?????    Patient attended prescribed visits   Celyn P. Helene Kelp PT, MPH 03/20/19 10:52 AM

## 2018-10-26 ENCOUNTER — Other Ambulatory Visit: Payer: Self-pay | Admitting: Sports Medicine

## 2018-11-10 ENCOUNTER — Other Ambulatory Visit: Payer: Self-pay

## 2018-11-10 ENCOUNTER — Encounter: Payer: Self-pay | Admitting: Sports Medicine

## 2018-11-10 ENCOUNTER — Ambulatory Visit (INDEPENDENT_AMBULATORY_CARE_PROVIDER_SITE_OTHER): Payer: BC Managed Care – PPO

## 2018-11-10 ENCOUNTER — Ambulatory Visit (INDEPENDENT_AMBULATORY_CARE_PROVIDER_SITE_OTHER): Payer: BC Managed Care – PPO | Admitting: Sports Medicine

## 2018-11-10 DIAGNOSIS — M51369 Other intervertebral disc degeneration, lumbar region without mention of lumbar back pain or lower extremity pain: Secondary | ICD-10-CM

## 2018-11-10 DIAGNOSIS — M7542 Impingement syndrome of left shoulder: Secondary | ICD-10-CM

## 2018-11-10 DIAGNOSIS — M25512 Pain in left shoulder: Secondary | ICD-10-CM

## 2018-11-10 DIAGNOSIS — G8929 Other chronic pain: Secondary | ICD-10-CM

## 2018-11-10 DIAGNOSIS — M5136 Other intervertebral disc degeneration, lumbar region: Secondary | ICD-10-CM | POA: Diagnosis not present

## 2018-11-10 DIAGNOSIS — M5030 Other cervical disc degeneration, unspecified cervical region: Secondary | ICD-10-CM

## 2018-11-10 MED ORDER — TRAMADOL HCL 50 MG PO TABS: 50.0000 mg | ORAL_TABLET | Freq: Three times a day (TID) | ORAL | 0 refills | Status: DC | PRN

## 2018-11-10 NOTE — Progress Notes (Signed)
Subjective:    CC: Follow-up  HPI: Joseph Jacobs returns, he is a pleasant 63 year old male, he has left-sided shoulder impingement symptoms, greater than 6 weeks without improvement.  Lumbar spondylosis: Good response to facet medial branch blocks, needs to be set up for radiofrequency ablation.  I reviewed the past medical history, family history, social history, surgical history, and allergies today and no changes were needed.  Please see the problem list section below in epic for further details.  Past Medical History: Past Medical History:  Diagnosis Date  . Anginal pain (HCC)   . Arthritis    "hands, knees, back, rightt foot" (10/11/2016)  . Asthma   . Chronic bronchitis (HCC)    "get it most years" (10/11/2016)  . Chronic lower back pain   . Coronary artery disease   . High cholesterol   . Lumbar degenerative disc disease 12/14/2013  . Migraine    "daily to weekly" (10/11/2016)  . Pneumonia    "several times; last time was in 03/2016" (10/11/2016)  . Sleep apnea   . Stress fracture of foot 12/14/2013   "right"   Past Surgical History: Past Surgical History:  Procedure Laterality Date  . BILATERAL CARPAL TUNNEL RELEASE Bilateral   . CORONARY ANGIOPLASTY WITH STENT PLACEMENT  10/11/2016  . CORONARY STENT INTERVENTION N/A 10/11/2016   Procedure: CORONARY STENT INTERVENTION;  Surgeon: Runell Gess, MD;  [facility];  Service: Cardiovascular;  Laterality: N/A;  prox RCA  . KNEE ARTHROSCOPY Left 1978  . KNEE CARTILAGE SURGERY Left   . LEFT HEART CATH AND CORONARY ANGIOGRAPHY N/A 10/11/2016   Procedure: LEFT HEART CATH AND CORONARY ANGIOGRAPHY;  Surgeon: Runell Gess, MD;  [facility]: [facility];  Service: Cardiovascular;  Laterality: N/A;   Social History: Social History   Socioeconomic History  . Marital status: Married    Spouse name: [name]  . Number of children: 2  . Years of education: Masters  . Highest education level: Not on file   Occupational History  . Occupation: [employer]  Social Needs  . Financial resource strain: Not on file  . Food insecurity    Worry: Not on file    Inability: Not on file  . Transportation needs    Medical: Not on file    Non-medical: Not on file  Tobacco Use  . Smoking status: Former Smoker    Packs/day: 0.33    Years: 44.00    Pack years: 14.52    Types: Cigarettes    Quit date: 09/29/2016    Years since quitting: 2.1  . Smokeless tobacco: Never Used  Substance and Sexual Activity  . Alcohol use: Yes    Alcohol/week: 6.0 standard drinks    Types: 3 Glasses of wine, 3 Standard drinks or equivalent per week  . Drug use: No  . Sexual activity: Not Currently    Partners: Female  Lifestyle  . Physical activity    Days per week: Not on file    Minutes per session: Not on file  . Stress: Not on file  Relationships  . Social Musician on phone: Not on file    Gets together: Not on file    Attends religious service: Not on file    Active member of club or organization: Not on file    Attends meetings of clubs or organizations: Not on file    Relationship status: Not on file  Other Topics Concern  . Not on file  Social  History Narrative   Lives with wife and son   Caffeine use: Coffee daily   Right handed    Family History: Family History  Problem Relation Age of Onset  . Brain cancer Father   . Alcoholism Mother   . Diabetes Mother        father   . Lung cancer Mother   . Cancer Unknown   . Cancer - Prostate Brother    Allergies: Allergies  Allergen Reactions  . Azithromycin Other (See Comments)    Tingling/swelling in lips/tongue  . E-Mycin [Erythromycin] Nausea Only    GI upset    Medications: See med rec.  Review of Systems: No fevers, chills, night sweats, weight loss, chest pain, or shortness of breath.   Objective:    General: Well Developed, well nourished, and in no acute distress.  Neuro: Alert and oriented x3, extra-ocular muscles intact,  sensation grossly intact.  HEENT: Normocephalic, atraumatic, pupils equal round reactive to light, neck supple, no masses, no lymphadenopathy, thyroid nonpalpable.  Skin: Warm and dry, no rashes. Cardiac: Regular rate and rhythm, no murmurs rubs or gallops, no lower extremity edema.  Respiratory: Clear to auscultation bilaterally. Not using accessory muscles, speaking in full sentences.  Impression and Recommendations:    Impingement syndrome, shoulder, left Left shoulder impingement syndrome, failed greater than 6 weeks of conservative measures including formal physical therapy. Adding x-rays and an MRI today. The goal was to wait several months before another steroid injection so I would like him to come back sometime in the middle to end of November before considering another injection. Refilling tramadol in the meantime.  Lumbar degenerative disc disease Has had good responses to left L3-S1 facet joint medial branch blocks. He is now a candidate for radiofrequency ablation, he will need to discuss this with Dr. Francesco Runner.  I spent 25 minutes with this patient, greater than 50% was face-to-face time counseling regarding the above diagnoses.  ___________________________________________ Gwen Her. Dianah Field, M.D., ABFM., CAQSM. Primary Care and Sports Medicine [facility]  Adjunct Professor of [place] [employer] [facility] of Medicine

## 2018-11-10 NOTE — Assessment & Plan Note (Addendum)
Left shoulder impingement syndrome, failed greater than 6 weeks of conservative measures including formal physical therapy. Adding x-rays and an MRI today. The goal was to wait several months before another steroid injection so I would like him to come back sometime in the middle to end of November before considering another injection. Refilling tramadol in the meantime.

## 2018-11-10 NOTE — Assessment & Plan Note (Signed)
Has had good responses to left L3-S1 facet joint medial branch blocks. He is now a candidate for radiofrequency ablation, he will need to discuss this with Dr. Francesco Runner.

## 2018-11-18 ENCOUNTER — Other Ambulatory Visit: Payer: Self-pay | Admitting: Sports Medicine

## 2018-11-26 ENCOUNTER — Other Ambulatory Visit: Payer: Self-pay

## 2018-11-26 ENCOUNTER — Encounter: Payer: Self-pay | Admitting: Sports Medicine

## 2018-11-26 ENCOUNTER — Ambulatory Visit (INDEPENDENT_AMBULATORY_CARE_PROVIDER_SITE_OTHER): Payer: BC Managed Care – PPO | Admitting: Sports Medicine

## 2018-11-26 DIAGNOSIS — L02411 Cutaneous abscess of right axilla: Secondary | ICD-10-CM

## 2018-11-26 MED ORDER — HYDROCODONE-ACETAMINOPHEN 10-325 MG PO TABS: 1.0000 | ORAL_TABLET | Freq: Three times a day (TID) | ORAL | 0 refills | Status: DC | PRN

## 2018-11-26 MED ORDER — DOXYCYCLINE HYCLATE 100 MG PO TABS: 100.0000 mg | ORAL_TABLET | Freq: Two times a day (BID) | ORAL | 0 refills | Status: AC

## 2018-11-26 NOTE — Patient Instructions (Signed)
Incision and Drainage, Care After  This sheet gives you information about how to care for yourself after your procedure. Your health care provider may also give you more specific instructions. If you have problems or questions, contact your health care provider.  What can I expect after the procedure?  After the procedure, it is common to have:  · Pain or discomfort around the incision site.  · Blood, fluid, or pus (drainage) from the incision.  · Redness and firm skin around the incision site.  Follow these instructions at home:  Medicines  · Take over-the-counter and prescription medicines only as told by your health care provider.  · If you were prescribed an antibiotic medicine, use or take it as told by your health care provider. Do not stop using the antibiotic even if you start to feel better.  Wound care    Follow instructions from your health care provider about how to take care of your wound. Make sure you:  · Wash your hands with soap and water before and after you change your bandage (dressing). If soap and water are not available, use hand sanitizer.  · Change your dressing and packing as told by your health care provider.  ? If your dressing is dry or stuck when you try to remove it, moisten or wet the dressing with saline or water so that it can be removed without harming your skin or tissues.  ? If your wound is packed, leave it in place until your health care provider tells you to remove it. To remove the packing, moisten or wet the packing with saline or water so that it can be removed without harming your skin or tissues.  · Leave stitches (sutures), skin glue, or adhesive strips in place. These skin closures may need to stay in place for 2 weeks or longer. If adhesive strip edges start to loosen and curl up, you may trim the loose edges. Do not remove adhesive strips completely unless your health care provider tells you to do that.  Check your wound every day for signs of infection. Check  for:  · More redness, swelling, or pain.  · More fluid or blood.  · Warmth.  · Pus or a bad smell.  If you were sent home with a drain tube in place, follow instructions from your health care provider about:  · How to empty it.  · How to care for it at home.     General instructions  · Rest the affected area.  · Do not take baths, swim, or use a hot tub until your health care provider approves. Ask your health care provider if you may take showers. You may only be allowed to take sponge baths.  · Return to your normal activities as told by your health care provider. Ask your health care provider what activities are safe for you. Your health care provider may put you on activity or lifting restrictions.  · The incision will continue to drain. It is normal to have some clear or slightly bloody drainage. The amount of drainage should lessen each day.  · Do not apply any creams, ointments, or liquids unless you have been told to by your health care provider.  · Keep all follow-up visits as told by your health care provider. This is important.  Contact a health care provider if:  · Your cyst or abscess returns.  · You have a fever or chills.  · You have more redness, swelling, or pain   around your incision.  · You have more fluid or blood coming from your incision.  · Your incision feels warm to the touch.  · You have pus or a bad smell coming from your incision.  · You have red streaks above or below the incision site.  Get help right away if:  · You have severe pain or bleeding.  · You cannot eat or drink without vomiting.  · You have decreased urine output.  · You become short of breath.  · You have chest pain.  · You cough up blood.  · The affected area becomes numb or starts to tingle.  These symptoms may represent a serious problem that is an emergency. Do not wait to see if the symptoms will go away. Get medical help right away. Call your local emergency services (911 in the [place] Do not drive yourself to the  hospital.  Summary  · After this procedure, it is common to have fluid, blood, or pus coming from the surgery site.  · Follow all home care instructions. You will be told how to take care of your incision, how to check for infection, and how to take medicines.  · If you were prescribed an antibiotic medicine, take it as told by your health care provider. Do not stop taking the antibiotic even if you start to feel better.  · Contact a health care provider if you have increased redness, swelling, or pain around your incision. Get help right away if you have chest pain, you vomit, you cough up blood, or you have shortness of breath.  · Keep all follow-up visits as told by your health care provider. This is important.  This information is not intended to replace advice given to you by your health care provider. Make sure you discuss any questions you have with your health care provider.  Document Released: 04/09/2011 Document Revised: 12/16/2017 Document Reviewed: 12/16/2017  Elsevier Patient Education © 2020 Elsevier Inc.   

## 2018-11-26 NOTE — Progress Notes (Signed)
Subjective:    CC: Axillary abscess  HPI: This is a pleasant 63 year old male, for the past few days he is noted an increasingly painful pimple in his right axilla, it is now fairly large, surrounded by redness, pain is severe, persistent, localized without radiation, no constitutional symptoms.  I reviewed the past medical history, family history, social history, surgical history, and allergies today and no changes were needed.  Please see the problem list section below in epic for further details.  Past Medical History: Past Medical History:  Diagnosis Date  . Anginal pain ([place])   . Arthritis    "hands, knees, back, rightt foot" (10/11/2016)  . Asthma   . Chronic bronchitis ([place])    "get it most years" (10/11/2016)  . Chronic lower back pain   . Coronary artery disease   . High cholesterol   . Lumbar degenerative disc disease 12/14/2013  . Migraine    "daily to weekly" (10/11/2016)  . Pneumonia    "several times; last time was in 03/2016" (10/11/2016)  . Sleep apnea   . Stress fracture of foot 12/14/2013   "right"   Past Surgical History: Past Surgical History:  Procedure Laterality Date  . BILATERAL CARPAL TUNNEL RELEASE Bilateral   . CORONARY ANGIOPLASTY WITH STENT PLACEMENT  10/11/2016  . CORONARY STENT INTERVENTION N/A 10/11/2016   Procedure: CORONARY STENT INTERVENTION;  Surgeon: Lorretta Harp, MD;  Location: [place] [facility];  Service: Cardiovascular;  Laterality: N/A;  prox RCA  . KNEE ARTHROSCOPY Left 1978  . KNEE CARTILAGE SURGERY Left   . LEFT HEART CATH AND CORONARY ANGIOGRAPHY N/A 10/11/2016   Procedure: LEFT HEART CATH AND CORONARY ANGIOGRAPHY;  Surgeon: Lorretta Harp, MD;  Location: [place] [facility];  Service: Cardiovascular;  Laterality: N/A;   Social History: Social History   Socioeconomic History  . Marital status: Married    Spouse name: Nevin Bloodgood  . Number of children: 2  . Years of education: Masters  . Highest education level: Not on file   Occupational History  . Occupation: [employer]  Social Needs  . Financial resource strain: Not on file  . Food insecurity    Worry: Not on file    Inability: Not on file  . Transportation needs    Medical: Not on file    Non-medical: Not on file  Tobacco Use  . Smoking status: Former Smoker    Packs/day: 0.33    Years: 44.00    Pack years: 14.52    Types: Cigarettes    Quit date: 09/29/2016    Years since quitting: 2.1  . Smokeless tobacco: Never Used  Substance and Sexual Activity  . Alcohol use: Yes    Alcohol/week: 6.0 standard drinks    Types: 3 Glasses of wine, 3 Standard drinks or equivalent per week  . Drug use: No  . Sexual activity: Not Currently    Partners: Female  Lifestyle  . Physical activity    Days per week: Not on file    Minutes per session: Not on file  . Stress: Not on file  Relationships  . Social Herbalist on phone: Not on file    Gets together: Not on file    Attends religious service: Not on file    Active member of club or organization: Not on file    Attends meetings of clubs or organizations: Not on file    Relationship status: Not on file  Other Topics Concern  . Not on  file  Social History Narrative   Lives with wife and son   Caffeine use: Coffee daily   Right handed    Family History: Family History  Problem Relation Age of Onset  . Brain cancer Father   . Alcoholism Mother   . Diabetes Mother        father   . Lung cancer Mother   . Cancer Unknown   . Cancer - Prostate Brother    Allergies: Allergies  Allergen Reactions  . Azithromycin Other (See Comments)    Tingling/swelling in lips/tongue  . E-Mycin [Erythromycin] Nausea Only    GI upset    Medications: See med rec.  Review of Systems: No fevers, chills, night sweats, weight loss, chest pain, or shortness of breath.   Objective:    General: Well Developed, well nourished, and in no acute distress.  Neuro: Alert and oriented x3, extra-ocular muscles intact,  sensation grossly intact.  HEENT: Normocephalic, atraumatic, pupils equal round reactive to light, neck supple, no masses, no lymphadenopathy, thyroid nonpalpable.  Skin: Warm and dry, no rashes. Cardiac: Regular rate and rhythm, no murmurs rubs or gallops, no lower extremity edema.  Respiratory: Clear to auscultation bilaterally. Not using accessory muscles, speaking in full sentences. Right axilla: Large abscess noted.  Indurated, fluctuant.  Simple incision and drainage of abscess. Risks, benefits, and alternatives explained and consent obtained. Time out conducted. Surface cleaned with alcohol. 10cc lidocaine with epinephine infiltrated around abscess. Adequate anesthesia ensured. Area prepped and draped in a sterile fashion. #11 blade used to make a stab incision into abscess, I then made a full diamond-shaped incision. Pus expressed with pressure. Curved hemostat used to explore 4 quadrants and loculations broken up. Further purulence expressed. Hemostasis achieved. Pt stable. Aftercare and follow-up advised.  Impression and Recommendations:    Abscess of right axilla Incision and drainage today with a diamond shaped incision thus no packing will be used. Culture obtained. Doxycycline, hydrocodone for pain. Return in 2 weeks.   ___________________________________________ [name]. Benjamin Stain, M.D., ABFM., CAQSM. Primary Care and Sports Medicine [facility]  Adjunct Professor of Family Medicine  [employer]

## 2018-11-26 NOTE — Assessment & Plan Note (Signed)
Incision and drainage today with a diamond shaped incision thus no packing will be used. Culture obtained. Doxycycline, hydrocodone for pain. Return in 2 weeks.

## 2018-11-27 ENCOUNTER — Encounter: Payer: Self-pay | Admitting: Sports Medicine

## 2018-11-27 ENCOUNTER — Ambulatory Visit: Payer: BC Managed Care – PPO | Admitting: Sports Medicine

## 2018-11-28 ENCOUNTER — Ambulatory Visit (INDEPENDENT_AMBULATORY_CARE_PROVIDER_SITE_OTHER): Payer: BC Managed Care – PPO | Admitting: Sports Medicine

## 2018-11-28 ENCOUNTER — Other Ambulatory Visit: Payer: Self-pay

## 2018-11-28 DIAGNOSIS — L02411 Cutaneous abscess of right axilla: Secondary | ICD-10-CM

## 2018-11-28 NOTE — Progress Notes (Signed)
  Subjective: Joseph Jacobs returns, he had a bit of increased swelling after an I&D of a right axillary abscess 2 days ago.  Objective: General: Well-developed, well-nourished, and in no acute distress. Right axilla: Things look good, there is a bit of swelling as is to be expected.  Assessment/plan:   Abscess of right axilla Doing well 2 days post axillary abscess incision and drainage with a diamond-shaped incision and no packing. Continue doxycycline, he had a bit of swelling today that he was concerned about but this all looks normal. Keep follow-up next week.    ___________________________________________ Gwen Her. Dianah Field, M.D., ABFM., CAQSM. Primary Care and Sports Medicine [facility]  Adjunct Professor of [place] [employer] [facility] of Medicine

## 2018-11-28 NOTE — Assessment & Plan Note (Signed)
Doing well 2 days post axillary abscess incision and drainage with a diamond-shaped incision and no packing. Continue doxycycline, he had a bit of swelling today that he was concerned about but this all looks normal. Keep follow-up next week.

## 2018-11-29 ENCOUNTER — Other Ambulatory Visit: Payer: Self-pay | Admitting: Sports Medicine

## 2018-11-29 DIAGNOSIS — R072 Precordial pain: Secondary | ICD-10-CM

## 2018-11-29 DIAGNOSIS — I2584 Coronary atherosclerosis due to calcified coronary lesion: Secondary | ICD-10-CM

## 2018-11-29 DIAGNOSIS — I2510 Atherosclerotic heart disease of native coronary artery without angina pectoris: Secondary | ICD-10-CM

## 2018-11-29 DIAGNOSIS — G43809 Other migraine, not intractable, without status migrainosus: Secondary | ICD-10-CM

## 2018-11-30 LAB — WOUND CULTURE
MICRO NUMBER:: 1044814
SPECIMEN QUALITY:: ADEQUATE

## 2018-12-01 ENCOUNTER — Encounter: Payer: Self-pay | Admitting: Sports Medicine

## 2018-12-10 ENCOUNTER — Other Ambulatory Visit: Payer: Self-pay

## 2018-12-10 ENCOUNTER — Ambulatory Visit (INDEPENDENT_AMBULATORY_CARE_PROVIDER_SITE_OTHER): Payer: BC Managed Care – PPO | Admitting: Sports Medicine

## 2018-12-10 ENCOUNTER — Encounter: Payer: Self-pay | Admitting: Sports Medicine

## 2018-12-10 DIAGNOSIS — L02411 Cutaneous abscess of right axilla: Secondary | ICD-10-CM | POA: Diagnosis not present

## 2018-12-10 NOTE — Assessment & Plan Note (Signed)
Completely resolved, grew out MRSA, sensitive to doxycycline.

## 2018-12-10 NOTE — Progress Notes (Signed)
Subjective:    CC: Follow-up  HPI: Thayer Ohm returns, we incised and drained a right axillary abscess that grew out MRSA, sensitive to doxycycline, he is doing well now.  I reviewed the past medical history, family history, social history, surgical history, and allergies today and no changes were needed.  Please see the problem list section below in epic for further details.  Past Medical History: Past Medical History:  Diagnosis Date  . Anginal pain (HCC)   . Arthritis    "hands, knees, back, rightt foot" (10/11/2016)  . Asthma   . Chronic bronchitis (HCC)    "get it most years" (10/11/2016)  . Chronic lower back pain   . Coronary artery disease   . High cholesterol   . Lumbar degenerative disc disease 12/14/2013  . Migraine    "daily to weekly" (10/11/2016)  . Pneumonia    "several times; last time was in 03/2016" (10/11/2016)  . Sleep apnea   . Stress fracture of foot 12/14/2013   "right"   Past Surgical History: Past Surgical History:  Procedure Laterality Date  . BILATERAL CARPAL TUNNEL RELEASE Bilateral   . CORONARY ANGIOPLASTY WITH STENT PLACEMENT  10/11/2016  . CORONARY STENT INTERVENTION N/A 10/11/2016   Procedure: CORONARY STENT INTERVENTION;  Surgeon: Runell Gess, MD;  [facility]: [facility];  Service: Cardiovascular;  Laterality: N/A;  prox RCA  . KNEE ARTHROSCOPY Left 1978  . KNEE CARTILAGE SURGERY Left   . LEFT HEART CATH AND CORONARY ANGIOGRAPHY N/A 10/11/2016   Procedure: LEFT HEART CATH AND CORONARY ANGIOGRAPHY;  Surgeon: Runell Gess, MD;  [facility];  Service: Cardiovascular;  Laterality: N/A;   Social History: Social History   Socioeconomic History  . Marital status: Married    Spouse name: [name]  . Number of children: 2  . Years of education: Masters  . Highest education level: Not on file  Occupational History  . Occupation: [employer]  Social Needs  . Financial resource strain: Not on file  . Food insecurity    Worry:  Not on file    Inability: Not on file  . Transportation needs    Medical: Not on file    Non-medical: Not on file  Tobacco Use  . Smoking status: Former Smoker    Packs/day: 0.33    Years: 44.00    Pack years: 14.52    Types: Cigarettes    Quit date: 09/29/2016    Years since quitting: 2.1  . Smokeless tobacco: Never Used  Substance and Sexual Activity  . Alcohol use: Yes    Alcohol/week: 6.0 standard drinks    Types: 3 Glasses of wine, 3 Standard drinks or equivalent per week  . Drug use: No  . Sexual activity: Not Currently    Partners: Female  Lifestyle  . Physical activity    Days per week: Not on file    Minutes per session: Not on file  . Stress: Not on file  Relationships  . Social Musician on phone: Not on file    Gets together: Not on file    Attends religious service: Not on file    Active member of club or organization: Not on file    Attends meetings of clubs or organizations: Not on file    Relationship status: Not on file  Other Topics Concern  . Not on file  Social History Narrative   Lives with wife and son   Caffeine use: Coffee daily  Right handed    Family History: Family History  Problem Relation Age of Onset  . Brain cancer Father   . Alcoholism Mother   . Diabetes Mother        father   . Lung cancer Mother   . Cancer Unknown   . Cancer - Prostate Brother    Allergies: Allergies  Allergen Reactions  . Azithromycin Other (See Comments)    Tingling/swelling in lips/tongue  . E-Mycin [Erythromycin] Nausea Only    GI upset    Medications: See med rec.  Review of Systems: No fevers, chills, night sweats, weight loss, chest pain, or shortness of breath.   Objective:    General: Well Developed, well nourished, and in no acute distress.  Neuro: Alert and oriented x3, extra-ocular muscles intact, sensation grossly intact.  HEENT: Normocephalic, atraumatic, pupils equal round reactive to light, neck supple, no masses, no  lymphadenopathy, thyroid nonpalpable.  Skin: Warm and dry, no rashes. Cardiac: Regular rate and rhythm, no murmurs rubs or gallops, no lower extremity edema.  Respiratory: Clear to auscultation bilaterally. Not using accessory muscles, speaking in full sentences. Right axilla: Incision is clean, dry, and, no swelling, tenderness, erythema, pain.  Impression and Recommendations:    Abscess of right axilla Completely resolved, grew out MRSA, sensitive to doxycycline.   ___________________________________________ Gwen Her. Dianah Field, M.D., ABFM., CAQSM. Primary Care and Sports Medicine [facility]  Adjunct Professor of [place] [employer] [facility] of Medicine

## 2018-12-30 ENCOUNTER — Encounter: Payer: Self-pay | Admitting: Sports Medicine

## 2018-12-31 ENCOUNTER — Other Ambulatory Visit: Payer: Self-pay

## 2018-12-31 ENCOUNTER — Ambulatory Visit (INDEPENDENT_AMBULATORY_CARE_PROVIDER_SITE_OTHER): Payer: BC Managed Care – PPO | Admitting: Sports Medicine

## 2018-12-31 DIAGNOSIS — M1811 Unilateral primary osteoarthritis of first carpometacarpal joint, right hand: Secondary | ICD-10-CM | POA: Diagnosis not present

## 2018-12-31 NOTE — Assessment & Plan Note (Signed)
First [facility] injection as above.

## 2018-12-31 NOTE — Progress Notes (Signed)
Subjective:    CC: Follow-up  HPI: This is a pleasant 63 year old male, he has Dean arthritis, last injected about 3 to 4 months ago, now with recurrence of pain on the right, thumb base, localized without radiation.  I reviewed the past medical history, family history, social history, surgical history, and allergies today and no changes were needed.  Please see the problem list section below in epic for further details.  Past Medical History: Past Medical History:  Diagnosis Date  . Anginal pain ([place])   . Arthritis    "hands, knees, back, rightt foot" (10/11/2016)  . Asthma   . Chronic bronchitis ([place])    "get it most years" (10/11/2016)  . Chronic lower back pain   . Coronary artery disease   . High cholesterol   . Lumbar degenerative disc disease 12/14/2013  . Migraine    "daily to weekly" (10/11/2016)  . Pneumonia    "several times; last time was in 03/2016" (10/11/2016)  . Sleep apnea   . Stress fracture of foot 12/14/2013   "right"   Past Surgical History: Past Surgical History:  Procedure Laterality Date  . BILATERAL CARPAL TUNNEL RELEASE Bilateral   . CORONARY ANGIOPLASTY WITH STENT PLACEMENT  10/11/2016  . CORONARY STENT INTERVENTION N/A 10/11/2016   Procedure: CORONARY STENT INTERVENTION;  Surgeon: Lorretta Harp, MD;  Location: [facility];  Service: Cardiovascular;  Laterality: N/A;  prox RCA  . KNEE ARTHROSCOPY Left 1978  . KNEE CARTILAGE SURGERY Left   . LEFT HEART CATH AND CORONARY ANGIOGRAPHY N/A 10/11/2016   Procedure: LEFT HEART CATH AND CORONARY ANGIOGRAPHY;  Surgeon: Lorretta Harp, MD;  Location: [facility];  Service: Cardiovascular;  Laterality: N/A;   Social History: Social History   Socioeconomic History  . Marital status: Married    Spouse name: Nevin Bloodgood  . Number of children: 2  . Years of education: Masters  . Highest education level: Not on file  Occupational History  . Occupation: [employer]  Social Needs  . Financial resource  strain: Not on file  . Food insecurity    Worry: Not on file    Inability: Not on file  . Transportation needs    Medical: Not on file    Non-medical: Not on file  Tobacco Use  . Smoking status: Former Smoker    Packs/day: 0.33    Years: 44.00    Pack years: 14.52    Types: Cigarettes    Quit date: 09/29/2016    Years since quitting: 2.2  . Smokeless tobacco: Never Used  Substance and Sexual Activity  . Alcohol use: Yes    Alcohol/week: 6.0 standard drinks    Types: 3 Glasses of wine, 3 Standard drinks or equivalent per week  . Drug use: No  . Sexual activity: Not Currently    Partners: Female  Lifestyle  . Physical activity    Days per week: Not on file    Minutes per session: Not on file  . Stress: Not on file  Relationships  . Social Herbalist on phone: Not on file    Gets together: Not on file    Attends religious service: Not on file    Active member of club or organization: Not on file    Attends meetings of clubs or organizations: Not on file    Relationship status: Not on file  Other Topics Concern  . Not on file  Social History Narrative   Lives with wife  and son   Caffeine use: Coffee daily   Right handed    Family History: Family History  Problem Relation Age of Onset  . Brain cancer Father   . Alcoholism Mother   . Diabetes Mother        father   . Lung cancer Mother   . Cancer Unknown   . Cancer - Prostate Brother    Allergies: Allergies  Allergen Reactions  . Azithromycin Other (See Comments)    Tingling/swelling in lips/tongue  . E-Mycin [Erythromycin] Nausea Only    GI upset    Medications: See med rec.  Review of Systems: No fevers, chills, night sweats, weight loss, chest pain, or shortness of breath.   Objective:    General: Well Developed, well nourished, and in no acute distress.  Neuro: Alert and oriented x3, extra-ocular muscles intact, sensation grossly intact.  HEENT: Normocephalic, atraumatic, pupils equal round  reactive to light, neck supple, no masses, no lymphadenopathy, thyroid nonpalpable.  Skin: Warm and dry, no rashes. Cardiac: Regular rate and rhythm, no murmurs rubs or gallops, no lower extremity edema.  Respiratory: Clear to auscultation bilaterally. Not using accessory muscles, speaking in full sentences. Right hand: Tender to palpation at thumb basal joint.  Procedure: Real-time Ultrasound Guided injection of the right first [facility] Device: Samsung HS60  Verbal informed consent obtained.  Time-out conducted.  Noted no overlying erythema, induration, or other signs of local infection.  Skin prepped in a sterile fashion.  Local anesthesia: Topical Ethyl chloride.  With sterile technique and under real time ultrasound guidance:  1/2 cc Kenalog 40, 1/2 cc lidocaine injected easily  completed without difficulty  Pain immediately resolved suggesting accurate placement of the medication.  Advised to call if fevers/chills, erythema, induration, drainage, or persistent bleeding.  Images permanently stored and available for review in the ultrasound unit.  Impression: Technically successful ultrasound guided injection.  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   ___________________________________________ [name]. Benjamin Stain, M.D., ABFM., CAQSM. Primary Care and Sports Medicine [facility]  Adjunct Professor of Family Medicine  [employer]

## 2019-01-10 ENCOUNTER — Other Ambulatory Visit: Payer: Self-pay | Admitting: Sports Medicine

## 2019-01-10 DIAGNOSIS — J449 Chronic obstructive pulmonary disease, unspecified: Secondary | ICD-10-CM

## 2019-01-18 ENCOUNTER — Other Ambulatory Visit: Payer: Self-pay | Admitting: Sports Medicine

## 2019-01-18 DIAGNOSIS — E119 Type 2 diabetes mellitus without complications: Secondary | ICD-10-CM

## 2019-02-16 ENCOUNTER — Other Ambulatory Visit: Payer: Self-pay | Admitting: Sports Medicine

## 2019-02-16 DIAGNOSIS — G43809 Other migraine, not intractable, without status migrainosus: Secondary | ICD-10-CM

## 2019-02-16 DIAGNOSIS — I2510 Atherosclerotic heart disease of native coronary artery without angina pectoris: Secondary | ICD-10-CM

## 2019-02-16 DIAGNOSIS — I2584 Coronary atherosclerosis due to calcified coronary lesion: Secondary | ICD-10-CM

## 2019-02-16 DIAGNOSIS — R072 Precordial pain: Secondary | ICD-10-CM

## 2019-02-19 ENCOUNTER — Other Ambulatory Visit: Payer: Self-pay | Admitting: Sports Medicine

## 2019-02-19 DIAGNOSIS — E119 Type 2 diabetes mellitus without complications: Secondary | ICD-10-CM

## 2019-04-20 ENCOUNTER — Other Ambulatory Visit: Payer: Self-pay | Admitting: Sports Medicine

## 2019-04-20 DIAGNOSIS — J449 Chronic obstructive pulmonary disease, unspecified: Secondary | ICD-10-CM

## 2019-04-27 ENCOUNTER — Ambulatory Visit (INDEPENDENT_AMBULATORY_CARE_PROVIDER_SITE_OTHER): Payer: BC Managed Care – PPO | Admitting: Sports Medicine

## 2019-04-27 ENCOUNTER — Other Ambulatory Visit: Payer: Self-pay

## 2019-04-27 DIAGNOSIS — M1811 Unilateral primary osteoarthritis of first carpometacarpal joint, right hand: Secondary | ICD-10-CM | POA: Diagnosis not present

## 2019-04-27 DIAGNOSIS — H6123 Impacted cerumen, bilateral: Secondary | ICD-10-CM

## 2019-04-27 NOTE — Assessment & Plan Note (Signed)
Bilateral cerumen impactions again, difficult to hear, bilateral irrigations to clear. Return as needed for this.

## 2019-04-27 NOTE — Progress Notes (Signed)
    Procedures performed today:    Procedure: Real-time Ultrasound Guided injection of the right first [facility] Device: Samsung HS60  Verbal informed consent obtained.  Time-out conducted.  Noted no overlying erythema, induration, or other signs of local infection.  Skin prepped in a sterile fashion.  Local anesthesia: Topical Ethyl chloride.  With sterile technique and under real time ultrasound guidance:  1/2 cc Kenalog 40, 1/2 cc lidocaine injected easily  completed without difficulty  Pain immediately resolved suggesting accurate placement of the medication.  Advised to call if fevers/chills, erythema, induration, drainage, or persistent bleeding.  Images permanently stored and available for review in the ultrasound unit.  Impression: Technically successful ultrasound guided injection.  Indication: Cerumen impaction of the left and right ear(s) Medical necessity statement: On physical examination, cerumen impairs clinically significant portions of the external auditory canal, and tympanic membrane. Noted obstructive, copious cerumen that cannot be removed without magnification and instrumentations requiring physician skills Consent: Discussed benefits and risks of procedure and verbal consent obtained Procedure: Patient was prepped for the procedure. Utilized an otoscope to assess and take note of the ear canal, the tympanic membrane, and the presence, amount, and placement of the cerumen. Gentle water irrigation was utilized to remove cerumen.  Post procedure examination: shows cerumen was completely removed. Patient tolerated procedure well. The patient is made aware that they may experience temporary vertigo, temporary hearing loss, and temporary discomfort. If these symptom last for more than 24 hours to call the clinic or proceed to the ED.  Independent interpretation of notes and tests performed by another provider:   None.  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 He is had pain over the last couple of weeks. Repeat injection today, I would like him to go ahead and touch base with hand surgery.  Hearing loss due to cerumen impaction, bilateral Bilateral cerumen impactions again, difficult to hear, bilateral irrigations to clear. Return as needed for this.    ___________________________________________ Joseph Jacobs. Joseph Jacobs, M.D., ABFM., CAQSM. Primary Care and Sports Medicine [facility]  Adjunct Instructor of Family Medicine  [employer]

## 2019-04-27 NOTE — Assessment & Plan Note (Signed)
Joseph Jacobs returns, he is a pleasant 64 year old male with first CMC osteoarthritis, last injection was almost 4 months ago, he has had several over the past few years. He is had pain over the last couple of weeks. Repeat injection today, I would like him to go ahead and touch base with hand surgery.

## 2019-05-11 DIAGNOSIS — M5030 Other cervical disc degeneration, unspecified cervical region: Secondary | ICD-10-CM

## 2019-05-11 MED ORDER — PREDNISONE 50 MG PO TABS: ORAL_TABLET | ORAL | 0 refills | Status: DC

## 2019-05-16 ENCOUNTER — Other Ambulatory Visit: Payer: Self-pay | Admitting: Sports Medicine

## 2019-05-23 ENCOUNTER — Other Ambulatory Visit: Payer: Self-pay | Admitting: Sports Medicine

## 2019-05-23 DIAGNOSIS — I2510 Atherosclerotic heart disease of native coronary artery without angina pectoris: Secondary | ICD-10-CM

## 2019-05-23 DIAGNOSIS — G43809 Other migraine, not intractable, without status migrainosus: Secondary | ICD-10-CM

## 2019-05-23 DIAGNOSIS — R072 Precordial pain: Secondary | ICD-10-CM

## 2019-07-02 ENCOUNTER — Other Ambulatory Visit: Payer: Self-pay

## 2019-07-02 ENCOUNTER — Ambulatory Visit (INDEPENDENT_AMBULATORY_CARE_PROVIDER_SITE_OTHER): Payer: BC Managed Care – PPO | Admitting: Sports Medicine

## 2019-07-02 DIAGNOSIS — M5136 Other intervertebral disc degeneration, lumbar region: Secondary | ICD-10-CM

## 2019-07-02 DIAGNOSIS — E119 Type 2 diabetes mellitus without complications: Secondary | ICD-10-CM

## 2019-07-02 DIAGNOSIS — M1811 Unilateral primary osteoarthritis of first carpometacarpal joint, right hand: Secondary | ICD-10-CM

## 2019-07-02 DIAGNOSIS — M5030 Other cervical disc degeneration, unspecified cervical region: Secondary | ICD-10-CM | POA: Diagnosis not present

## 2019-07-02 DIAGNOSIS — M51369 Other intervertebral disc degeneration, lumbar region without mention of lumbar back pain or lower extremity pain: Secondary | ICD-10-CM

## 2019-07-02 DIAGNOSIS — M47816 Spondylosis without myelopathy or radiculopathy, lumbar region: Secondary | ICD-10-CM

## 2019-07-02 LAB — POCT UA - MICROALBUMIN
Albumin/Creatinine Ratio, Urine, POC: <30
Creatinine, POC: 200 mg/dL
Microalbumin Ur, POC: 10 mg/L

## 2019-07-02 LAB — POCT GLYCOSYLATED HEMOGLOBIN (HGB A1C): Hemoglobin A1C: 6.1 % — AB (ref 4.0–5.6)

## 2019-07-02 MED ORDER — DICLOFENAC SODIUM 3 % EX GEL: 1.0000 "application " | Freq: Two times a day (BID) | CUTANEOUS | 11 refills | Status: DC

## 2019-07-02 NOTE — Assessment & Plan Note (Addendum)
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 He had good responses to left L3-S1 facet medial branch blocks with Dr. Laurian Brim but they have not been able to get the RFA approved. I would like  imaging to give this a stab. On exam today he has relatively good motion, he has normal lumbar lordosis, there really are not any discrete areas of tenderness to palpation, skin looks good without any signs of infection.  Update 07/30/2019 I spoke with the insurance physician and a peer to peer, we made some updates to the documentation, this will be faxed back to the insurance company and they did tell us that the radiofrequency ablation procedure was now approved.

## 2019-07-02 NOTE — Assessment & Plan Note (Signed)
Joseph Jacobs continues to have left and right periscapular radicular symptoms, he did have a cervical epidural, I would support full SSI disability for this chronic inoperable process.

## 2019-07-02 NOTE — Progress Notes (Addendum)
    Procedures performed today:    None.  Independent interpretation of notes and tests performed by another provider:   None.  Brief History, Exam, Impression, and Recommendations:    Controlled type 2 diabetes mellitus without complication, without long-term current use of insulin (HCC) Has been doing really well on Ozempic. His A1c dropped from 7.6% to 6% at the last visit, rechecking A1c, microalbumin today. Foot exam performed today. Return in 6 months for this.  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 He had good responses to left L3-S1 facet medial branch blocks with Dr. Laurian Brim but they have not been able to get the RFA approved. I would like [facility] imaging to give this a stab. On exam today he has relatively good motion, he has normal lumbar lordosis, there really are not any discrete areas of tenderness to palpation, skin looks good without any signs of infection.  Update 07/30/2019 I spoke with the insurance physician and a peer to peer, we made some updates to the documentation, this will be faxed back to the insurance company and they did tell us that the radiofrequency ablation procedure was now approved.  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 I would like to switch him to 3% diclofenac.  DDD (degenerative disc disease), cervical Joseph Jacobs continues to have left and right periscapular radicular symptoms, he did have a cervical epidural, I would support full SSI disability for this chronic inoperable process.    ___________________________________________ Joseph Jacobs. Joseph Jacobs, M.D., ABFM., CAQSM. Primary Care and Sports Medicine [facility]  Adjunct Instructor of Family Medicine  [employer]

## 2019-07-02 NOTE — Assessment & Plan Note (Signed)
Has been doing really well on Ozempic. His A1c dropped from 7.6% to 6% at the last visit, rechecking A1c, microalbumin today. Foot exam performed today. Return in 6 months for this.

## 2019-07-02 NOTE — Assessment & Plan Note (Signed)
Persistent pain right first CMC, this is end-stage, has failed multiple injections, surgery is the next option. I would like to switch him to 3% diclofenac.

## 2019-07-03 ENCOUNTER — Telehealth: Payer: Self-pay | Admitting: *Deleted

## 2019-07-03 DIAGNOSIS — M5136 Other intervertebral disc degeneration, lumbar region: Secondary | ICD-10-CM

## 2019-07-03 DIAGNOSIS — M51369 Other intervertebral disc degeneration, lumbar region without mention of lumbar back pain or lower extremity pain: Secondary | ICD-10-CM

## 2019-07-03 NOTE — Telephone Encounter (Signed)
[name] from [facility] left a vm stating that she needs you to order a medial branch block for the pt.  The one he had at Dr. Jon Gills office was done around a year ago.

## 2019-07-03 NOTE — Telephone Encounter (Signed)
I did

## 2019-07-07 ENCOUNTER — Other Ambulatory Visit: Payer: Self-pay | Admitting: Sports Medicine

## 2019-07-07 DIAGNOSIS — M5136 Other intervertebral disc degeneration, lumbar region: Secondary | ICD-10-CM

## 2019-07-16 ENCOUNTER — Other Ambulatory Visit: Payer: Self-pay

## 2019-07-16 ENCOUNTER — Ambulatory Visit
Admission: RE | Admit: 2019-07-16 | Discharge: 2019-07-16 | Disposition: A | Discharge status: Home / Self Care | Payer: BC Managed Care – PPO | Source: Ambulatory Visit | Attending: Sports Medicine | Admitting: Sports Medicine

## 2019-07-16 ENCOUNTER — Other Ambulatory Visit: Payer: Self-pay | Admitting: Sports Medicine

## 2019-07-16 DIAGNOSIS — M5136 Other intervertebral disc degeneration, lumbar region: Secondary | ICD-10-CM

## 2019-07-16 NOTE — Discharge Instructions (Signed)

## 2019-08-07 ENCOUNTER — Encounter (INDEPENDENT_AMBULATORY_CARE_PROVIDER_SITE_OTHER): Payer: BC Managed Care – PPO

## 2019-08-07 ENCOUNTER — Other Ambulatory Visit: Payer: BC Managed Care – PPO

## 2019-08-07 DIAGNOSIS — M47816 Spondylosis without myelopathy or radiculopathy, lumbar region: Secondary | ICD-10-CM | POA: Diagnosis not present

## 2019-08-07 MED ORDER — PREDNISONE 50 MG PO TABS: ORAL_TABLET | ORAL | 0 refills | Status: DC

## 2019-08-19 ENCOUNTER — Other Ambulatory Visit: Payer: Self-pay | Admitting: Sports Medicine

## 2019-08-19 ENCOUNTER — Ambulatory Visit
Admission: RE | Admit: 2019-08-19 | Discharge: 2019-08-19 | Disposition: A | Discharge status: Home / Self Care | Payer: BC Managed Care – PPO | Source: Ambulatory Visit | Attending: Sports Medicine | Admitting: Sports Medicine

## 2019-08-19 ENCOUNTER — Other Ambulatory Visit: Payer: Self-pay

## 2019-08-19 DIAGNOSIS — M5136 Other intervertebral disc degeneration, lumbar region: Secondary | ICD-10-CM

## 2019-08-19 MED ORDER — MIDAZOLAM HCL 2 MG/2ML IJ SOLN
1.0000 mg | INTRAMUSCULAR | Status: DC | PRN
  Administered 2019-08-19: 1 mg via INTRAVENOUS
  Administered 2019-08-19: 0.5 mg via INTRAVENOUS
  Administered 2019-08-19: 1 mg via INTRAVENOUS
  Administered 2019-08-19: 0.5 mg via INTRAVENOUS

## 2019-08-19 MED ORDER — FENTANYL CITRATE (PF) 100 MCG/2ML IJ SOLN
25.0000 ug | INTRAMUSCULAR | Status: DC | PRN
  Administered 2019-08-19: 25 ug via INTRAVENOUS
  Administered 2019-08-19: 50 ug via INTRAVENOUS

## 2019-08-19 MED ORDER — SODIUM CHLORIDE 0.9 % IV SOLN: Freq: Once | INTRAVENOUS | Status: AC

## 2019-08-19 MED ORDER — METHYLPREDNISOLONE ACETATE 40 MG/ML INJ SUSP (RADIOLOG
120.0000 mg | Freq: Once | INTRAMUSCULAR | Status: AC
  Administered 2019-08-19: 120 mg via INTRA_ARTICULAR

## 2019-08-19 MED ORDER — KETOROLAC TROMETHAMINE 30 MG/ML IJ SOLN
30.0000 mg | Freq: Once | INTRAMUSCULAR | Status: AC
  Administered 2019-08-19: 30 mg via INTRAVENOUS

## 2019-08-19 NOTE — Discharge Instructions (Signed)
Radiofrequency Ablation Discharge Instructions  1. After your radiofrequency ablation use ice to the affected area for the next 24 hours, as a temporary increase in pain is not uncommon for a day or two after your procedure.  2. Resume all medications.  3. Follow up with your ordering physician for post-care.  4. If you have any of the following, please call [phone]:      Temperature greater than 101     Pain, redness or swelling at the injection site

## 2019-08-20 ENCOUNTER — Other Ambulatory Visit: Payer: Self-pay | Admitting: Sports Medicine

## 2019-08-20 DIAGNOSIS — G43809 Other migraine, not intractable, without status migrainosus: Secondary | ICD-10-CM

## 2019-08-20 DIAGNOSIS — R072 Precordial pain: Secondary | ICD-10-CM

## 2019-08-20 DIAGNOSIS — I2584 Coronary atherosclerosis due to calcified coronary lesion: Secondary | ICD-10-CM

## 2019-08-25 IMAGING — DX DG CHEST 2V
2 series · 2 of 2 positions shown · non-contrast
Comparison: Radiograph 09/26/2016

CLINICAL DATA: Chest pain for 1 week.

EXAM:
CHEST  2 VIEW

[chest pa]
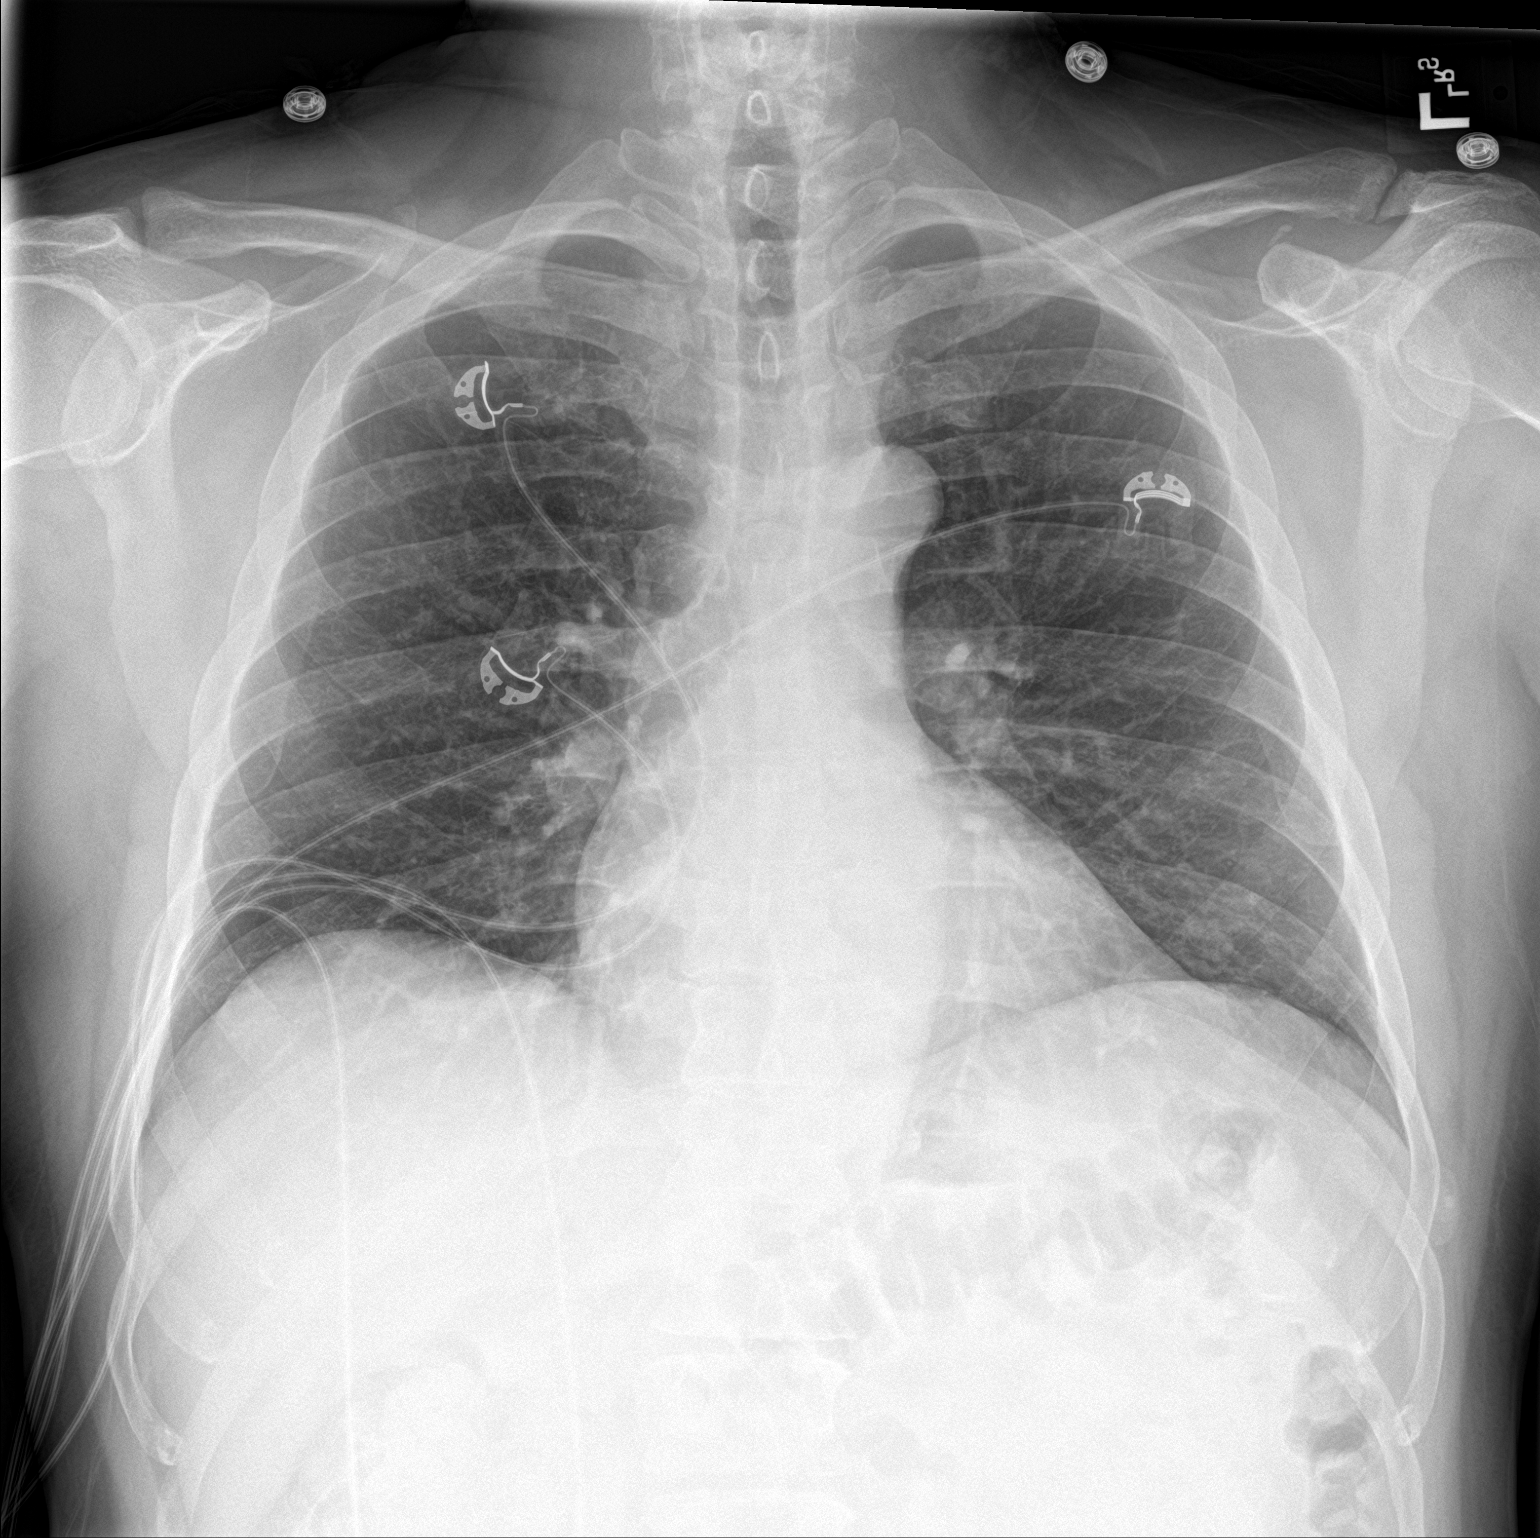

[chest lat]
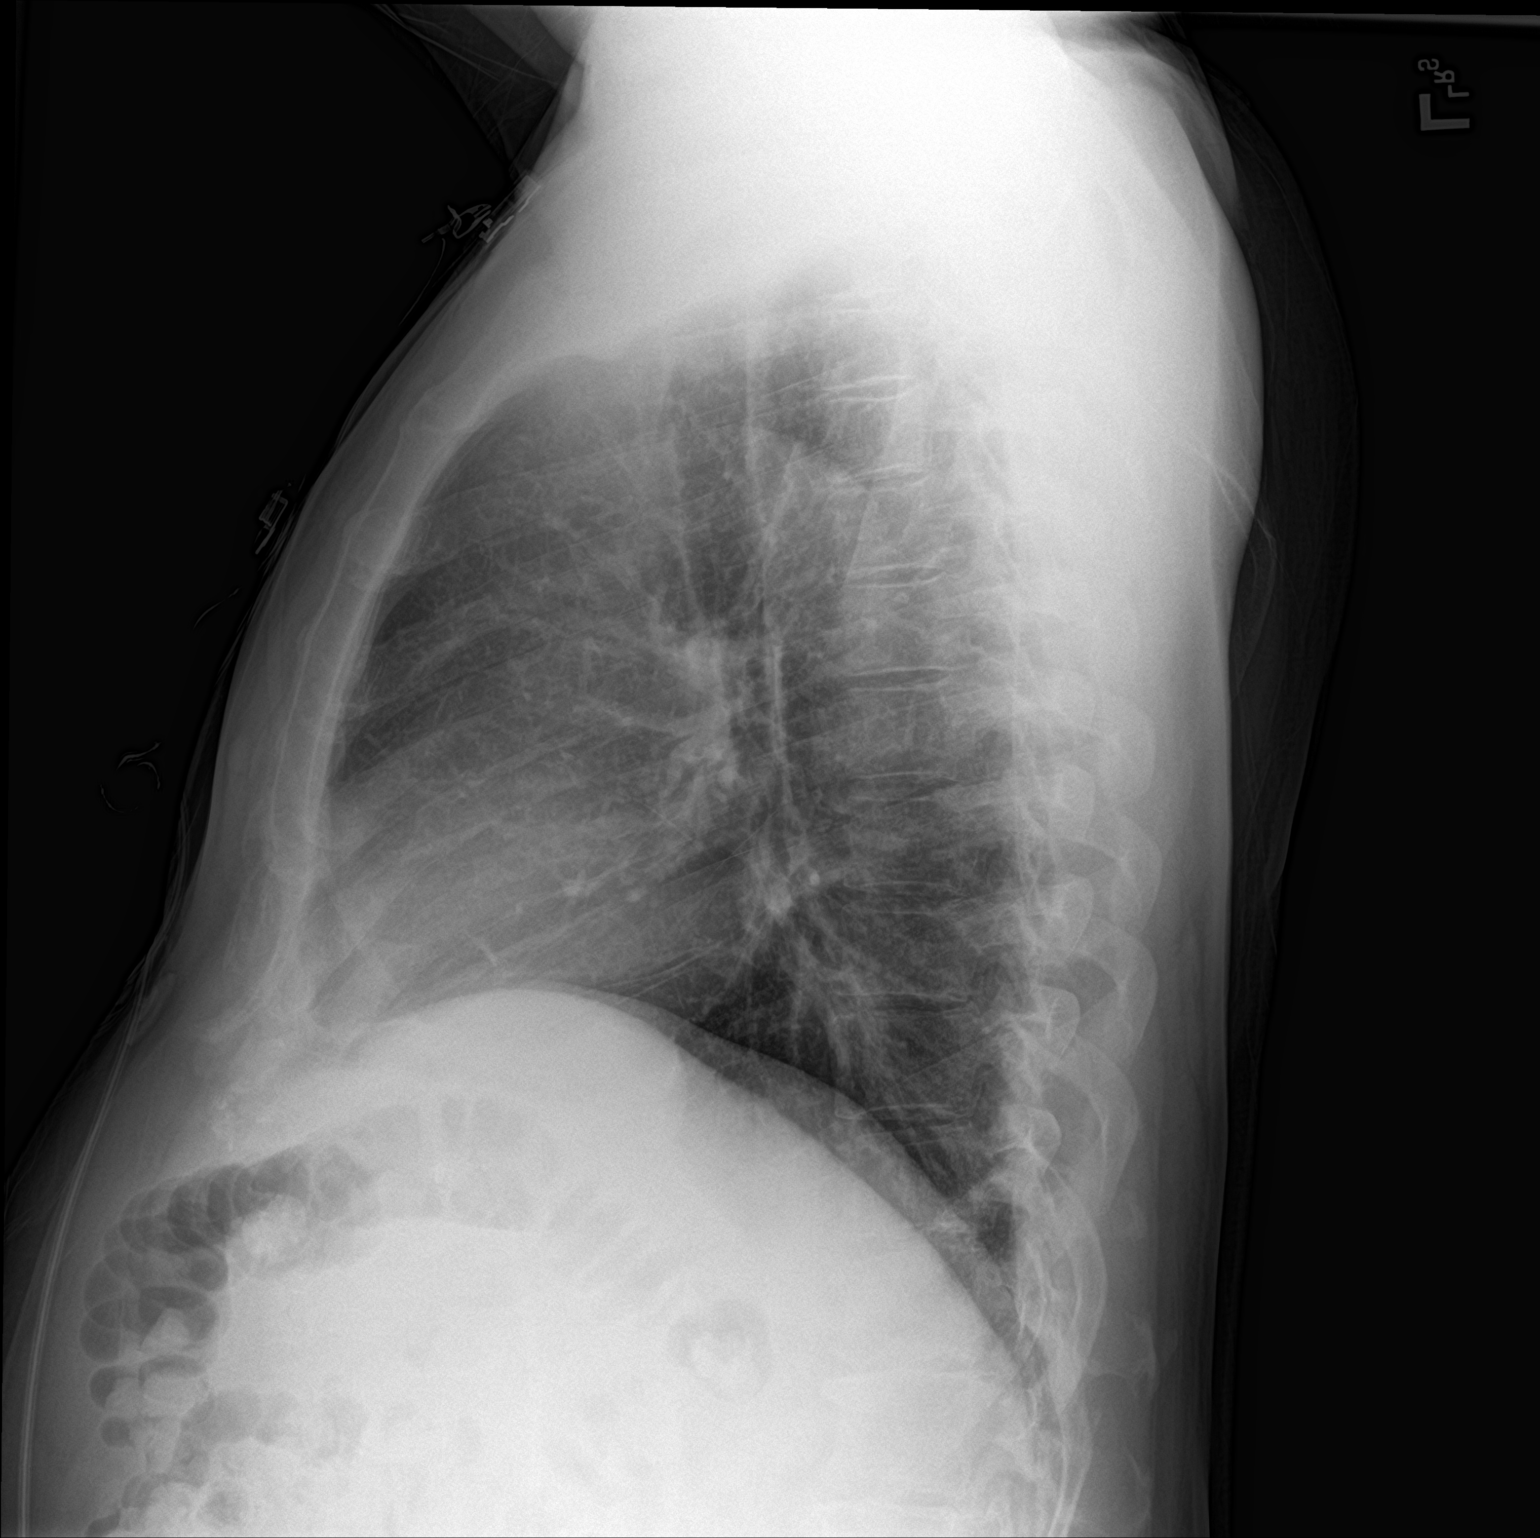

[2 of 2 positions shown; findings below may reference images not displayed]

FINDINGS: The cardiomediastinal contours are normal. The lungs are clear.
Pulmonary vasculature is normal. No consolidation, pleural effusion,
or pneumothorax. No acute osseous abnormalities are seen.
IMPRESSION: No acute pulmonary process.

## 2019-08-26 ENCOUNTER — Ambulatory Visit (INDEPENDENT_AMBULATORY_CARE_PROVIDER_SITE_OTHER): Payer: BC Managed Care – PPO | Admitting: Sports Medicine

## 2019-08-26 ENCOUNTER — Encounter: Payer: Self-pay | Admitting: Sports Medicine

## 2019-08-26 DIAGNOSIS — M47816 Spondylosis without myelopathy or radiculopathy, lumbar region: Secondary | ICD-10-CM

## 2019-08-26 DIAGNOSIS — M7542 Impingement syndrome of left shoulder: Secondary | ICD-10-CM

## 2019-08-26 DIAGNOSIS — M1811 Unilateral primary osteoarthritis of first carpometacarpal joint, right hand: Secondary | ICD-10-CM | POA: Diagnosis not present

## 2019-08-26 NOTE — Assessment & Plan Note (Signed)
Persistent impingement symptoms, subacromial injection today, return as needed.

## 2019-08-26 NOTE — Progress Notes (Signed)
    Procedures performed today:    Procedure: Real-time Ultrasound Guided injection of the right first [facility] Device: Samsung HS60  Verbal informed consent obtained.  Time-out conducted.  Noted no overlying erythema, induration, or other signs of local infection.  Skin prepped in a sterile fashion.  Local anesthesia: Topical Ethyl chloride.  With sterile technique and under real time ultrasound guidance:  1/2 cc lidocaine, 1/2 cc kenalog 40 injected easily completed without difficulty  Pain immediately resolved suggesting accurate placement of the medication.  Advised to call if fevers/chills, erythema, induration, drainage, or persistent bleeding.  Images permanently stored and available for review in the ultrasound unit.  Impression: Technically successful ultrasound guided injection.  Procedure: Real-time Ultrasound Guided injection of the left subacromial bursa Device: Samsung HS60  Verbal informed consent obtained.  Time-out conducted.  Noted no overlying erythema, induration, or other signs of local infection.  Skin prepped in a sterile fashion.  Local anesthesia: Topical Ethyl chloride.  With sterile technique and under real time ultrasound guidance: 1 cc Kenalog 40, 1 cc lidocaine, 1 cc bupivacaine injected easily Completed without difficulty  Pain immediately resolved suggesting accurate placement of the medication.  Advised to call if fevers/chills, erythema, induration, drainage, or persistent bleeding.  Images permanently stored and available for review in the ultrasound unit.  Impression: Technically successful ultrasound guided injection.  Independent interpretation of notes and tests performed by another provider:   None.  Brief History, Exam, Impression, and Recommendations:    Impingement syndrome, shoulder, left Persistent impingement symptoms, subacromial injection today, return as needed.  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,  right Thayer Ohm has had several trapeziometacarpal joint injections, we did send him to Dr. Amanda Pea for consideration of [facility] arthroplasty, he desires to wait till later in the year so I am happy to keep him going with a few injections here and there until he is ready for the operation. Return as needed.  Lumbar facet joint syndrome Thayer Ohm has end-stage lumbar spondylosis, we have traced his pain to his left L3-S1 facet joints, he did well with left L3 S1 facet medial branch blocks with Dr. Laurian Brim, ultimately his radiofrequency ablation was not approved, we were able to call the insurance company given approval, he had his RFA approximately a week ago and is doing extremely well with regards to his left side, I am going to go ahead and proceed with the same procedure on the right side.    ___________________________________________ Joseph Jacobs. Benjamin Stain, M.D., ABFM., CAQSM. Primary Care and Sports Medicine [place]  Adjunct Instructor of Family Medicine  [employer]

## 2019-08-26 NOTE — Assessment & Plan Note (Signed)
Joseph Jacobs has end-stage lumbar spondylosis, we have traced his pain to his left L3-S1 facet joints, he did well with left L3 S1 facet medial branch blocks with Dr. Laurian Brim, ultimately his radiofrequency ablation was not approved, we were able to call the insurance company given approval, he had his RFA approximately a week ago and is doing extremely well with regards to his left side, I am going to go ahead and proceed with the same procedure on the right side.

## 2019-08-26 NOTE — Assessment & Plan Note (Signed)
Joseph Jacobs has had several trapeziometacarpal joint injections, we did send him to Dr. Amanda Pea for consideration of [facility] arthroplasty, he desires to wait till later in the year so I am happy to keep him going with a few injections here and there until he is ready for the operation. Return as needed.

## 2019-08-29 ENCOUNTER — Other Ambulatory Visit: Payer: Self-pay | Admitting: Sports Medicine

## 2019-08-29 DIAGNOSIS — E119 Type 2 diabetes mellitus without complications: Secondary | ICD-10-CM

## 2019-08-30 IMAGING — DX DG CHEST 2V
2 series · 2 of 2 positions shown · non-contrast
Comparison: 10/11/2016

CLINICAL DATA: Shortness of Breath

EXAM:
CHEST  2 VIEW

[chest pa]
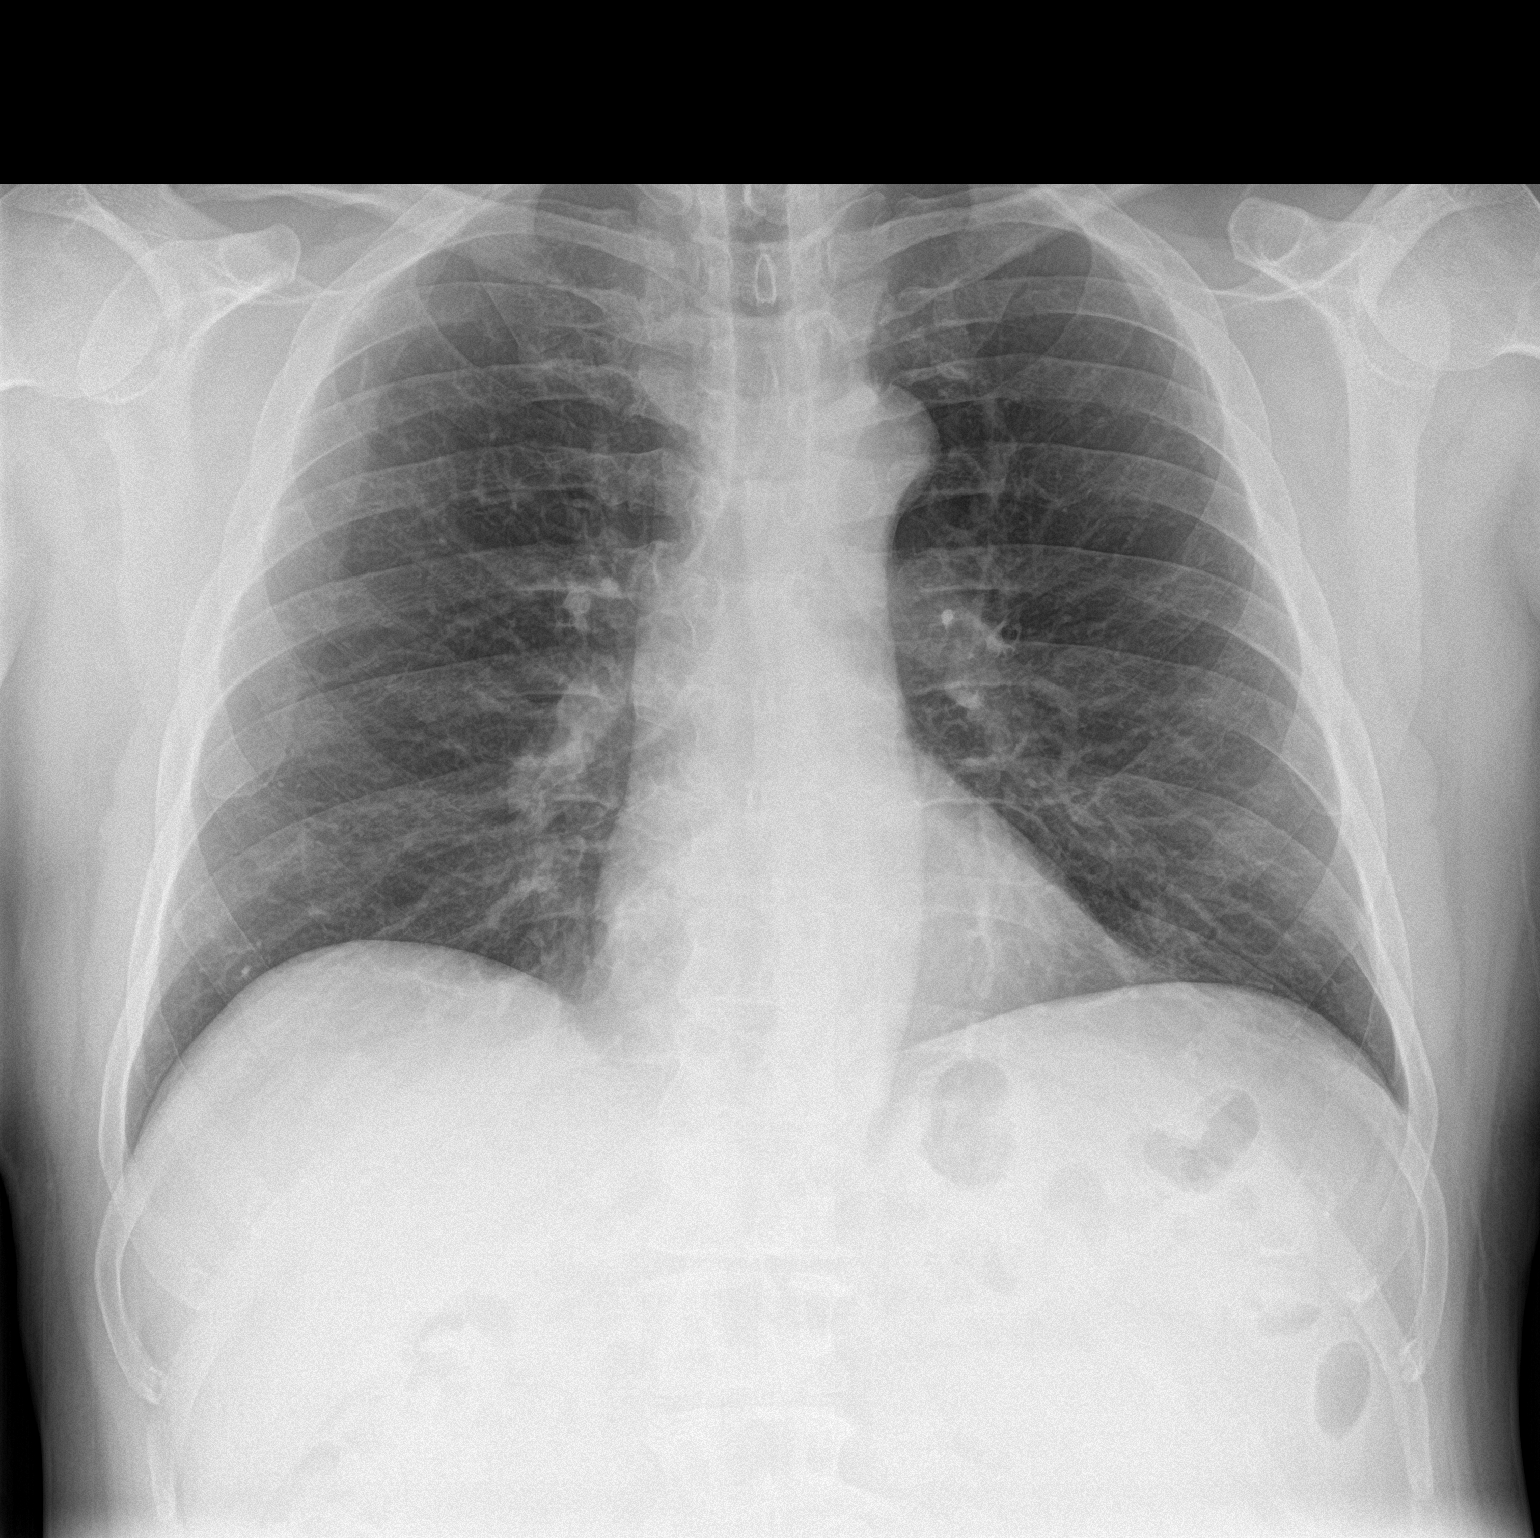

[chest lat]
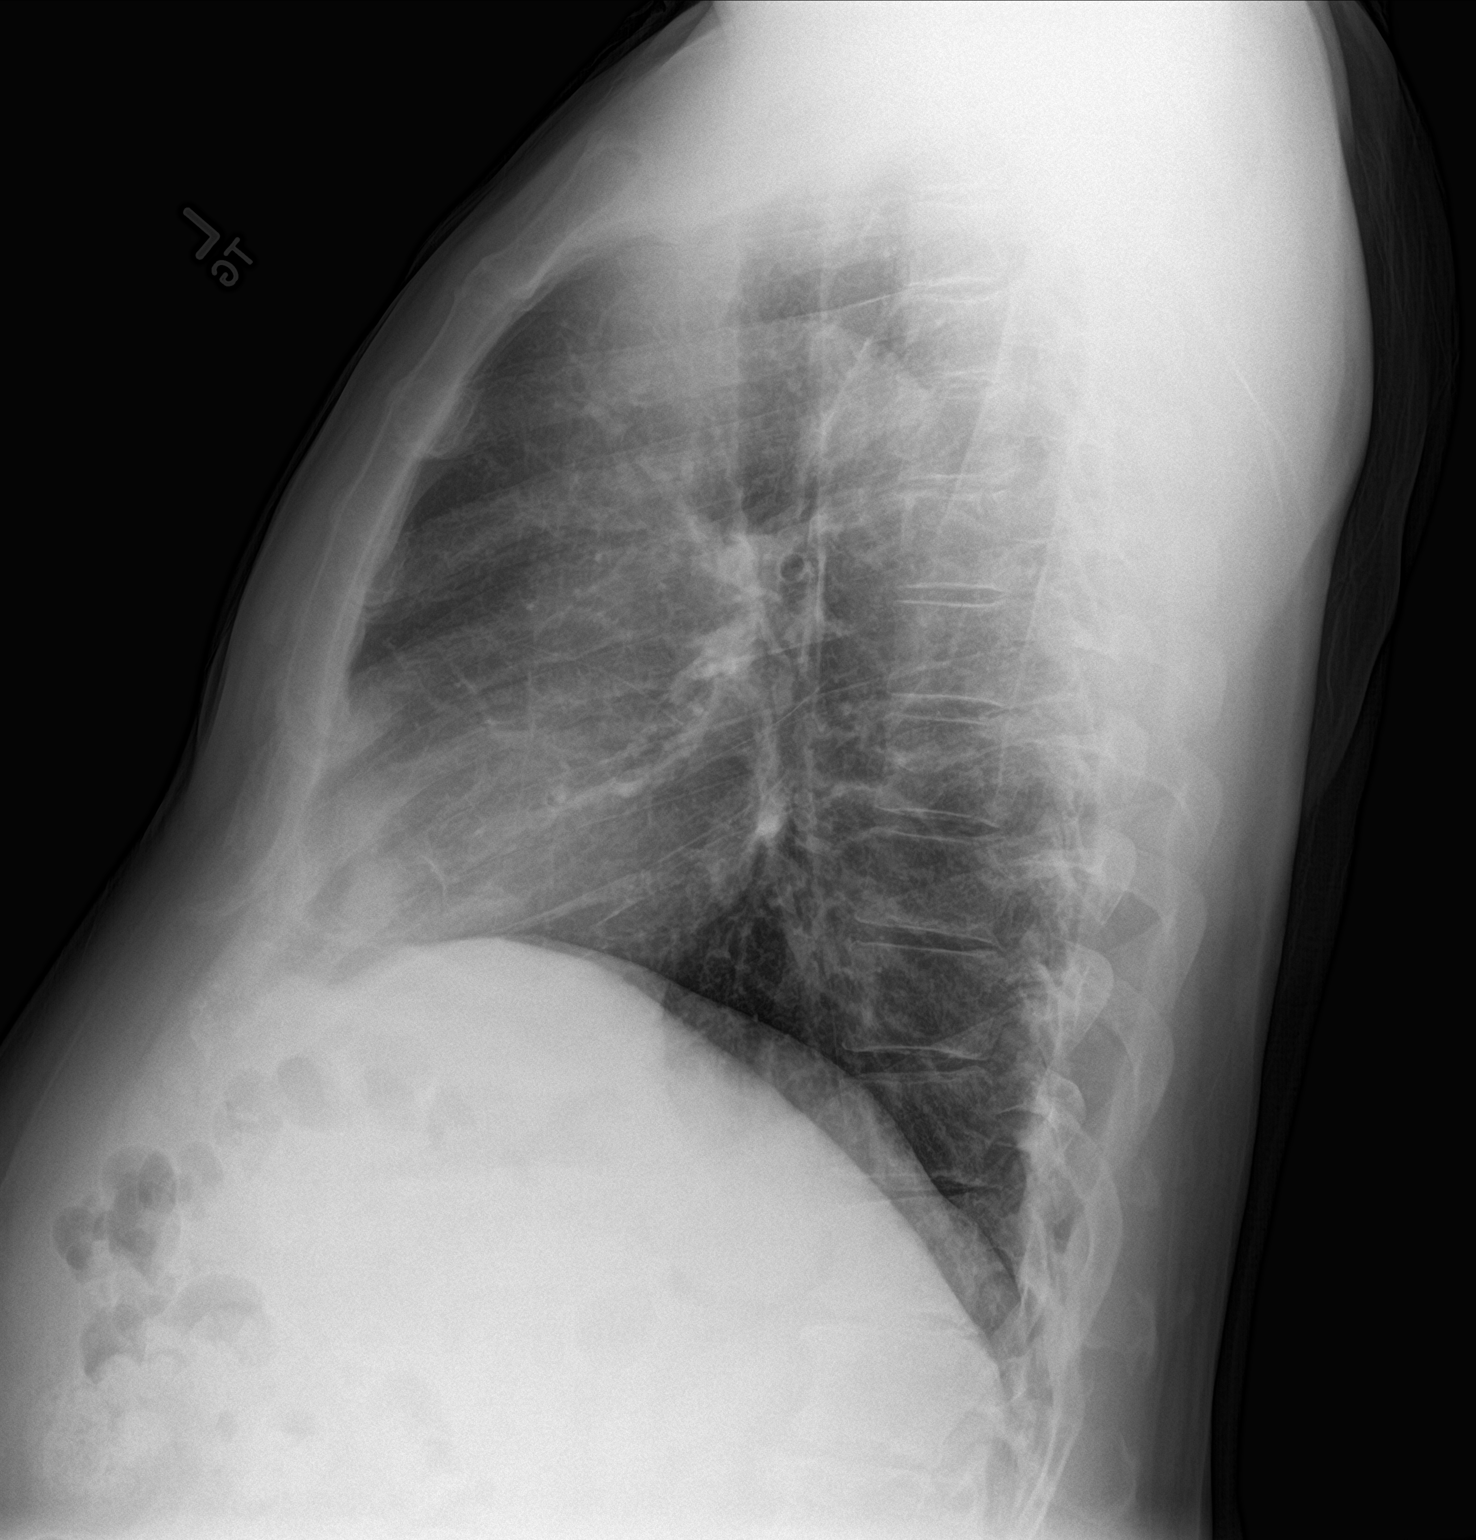

[2 of 2 positions shown; findings below may reference images not displayed]

FINDINGS: Heart and mediastinal contours are within normal limits. No focal
opacities or effusions. No acute bony abnormality.
IMPRESSION: No active cardiopulmonary disease.

## 2019-08-31 ENCOUNTER — Other Ambulatory Visit: Payer: Self-pay | Admitting: Sports Medicine

## 2019-08-31 DIAGNOSIS — E119 Type 2 diabetes mellitus without complications: Secondary | ICD-10-CM

## 2019-08-31 MED ORDER — OZEMPIC (1 MG/DOSE) 2 MG/1.5ML ~~LOC~~ SOPN: 1.0000 mg | PEN_INJECTOR | SUBCUTANEOUS | 11 refills | Status: DC

## 2019-09-02 ENCOUNTER — Other Ambulatory Visit: Payer: Self-pay | Admitting: Sports Medicine

## 2019-09-02 DIAGNOSIS — E119 Type 2 diabetes mellitus without complications: Secondary | ICD-10-CM

## 2019-09-02 DIAGNOSIS — J449 Chronic obstructive pulmonary disease, unspecified: Secondary | ICD-10-CM

## 2019-09-02 DIAGNOSIS — M47816 Spondylosis without myelopathy or radiculopathy, lumbar region: Secondary | ICD-10-CM

## 2019-09-02 MED ORDER — OZEMPIC (0.25 OR 0.5 MG/DOSE) 2 MG/1.5ML ~~LOC~~ SOPN: 0.5000 mg | PEN_INJECTOR | SUBCUTANEOUS | 11 refills | Status: DC

## 2019-09-06 ENCOUNTER — Other Ambulatory Visit: Payer: Self-pay | Admitting: Sports Medicine

## 2019-09-06 DIAGNOSIS — E119 Type 2 diabetes mellitus without complications: Secondary | ICD-10-CM

## 2019-09-07 ENCOUNTER — Other Ambulatory Visit: Payer: Self-pay

## 2019-09-07 ENCOUNTER — Other Ambulatory Visit: Payer: Self-pay | Admitting: Sports Medicine

## 2019-09-07 ENCOUNTER — Ambulatory Visit
Admission: RE | Admit: 2019-09-07 | Discharge: 2019-09-07 | Disposition: A | Discharge status: Home / Self Care | Payer: BC Managed Care – PPO | Source: Ambulatory Visit | Attending: Sports Medicine | Admitting: Sports Medicine

## 2019-09-07 DIAGNOSIS — M47816 Spondylosis without myelopathy or radiculopathy, lumbar region: Secondary | ICD-10-CM

## 2019-09-14 ENCOUNTER — Telehealth: Payer: Self-pay | Admitting: Sports Medicine

## 2019-09-14 NOTE — Telephone Encounter (Signed)
Received fax for PA on Ozempic it was started on 8/11 when going in to answer clinical questions I got message that the PA was resolved and no additional PA is required through cover my meds. - CF

## 2019-09-21 ENCOUNTER — Ambulatory Visit
Admission: RE | Admit: 2019-09-21 | Discharge: 2019-09-21 | Disposition: A | Discharge status: Home / Self Care | Payer: BC Managed Care – PPO | Source: Ambulatory Visit | Attending: Sports Medicine | Admitting: Sports Medicine

## 2019-09-21 ENCOUNTER — Other Ambulatory Visit: Payer: Self-pay | Admitting: Sports Medicine

## 2019-09-21 ENCOUNTER — Other Ambulatory Visit: Payer: Self-pay

## 2019-09-21 VITALS — BP 121/84 | HR 77 | Temp 97.9°F | Resp 12

## 2019-09-21 DIAGNOSIS — M47816 Spondylosis without myelopathy or radiculopathy, lumbar region: Secondary | ICD-10-CM

## 2019-09-21 MED ORDER — SODIUM CHLORIDE 0.9 % IV SOLN: Freq: Once | INTRAVENOUS | Status: AC

## 2019-09-21 MED ORDER — FENTANYL CITRATE (PF) 100 MCG/2ML IJ SOLN
25.0000 ug | INTRAMUSCULAR | Status: DC | PRN
  Administered 2019-09-21 (×2): 25 ug via INTRAVENOUS

## 2019-09-21 MED ORDER — MIDAZOLAM HCL 2 MG/2ML IJ SOLN
1.0000 mg | INTRAMUSCULAR | Status: DC | PRN
  Administered 2019-09-21 (×2): 1 mg via INTRAVENOUS
  Administered 2019-09-21: 0.5 mg via INTRAVENOUS

## 2019-09-21 MED ORDER — KETOROLAC TROMETHAMINE 30 MG/ML IJ SOLN
30.0000 mg | Freq: Once | INTRAMUSCULAR | Status: AC
  Administered 2019-09-21: 30 mg via INTRAVENOUS

## 2019-09-21 NOTE — Progress Notes (Signed)
Pt back in nursing station. Pt alert and oriented, talking in complete sentences. Will monitor until discharge time.

## 2019-09-21 NOTE — Discharge Instructions (Signed)
Radio Frequency Ablation Post Procedure Discharge Instructions  1. May resume a regular diet and any medications that you routinely take (including pain medications). 2. No driving day of procedure. 3. Upon discharge go home and rest for at least 4 hours.  May use an ice pack as needed to injection sites on back. 4. Remove bandades later, today.    Please contact our office at [phone] for the following symptoms:   Fever greater than 100 degrees  Increased swelling, pain, or redness at injection site.   Thank you for visiting [facility].Radio Frequency Ablation Post Procedure Discharge Instructions  5. May resume a regular diet and any medications that you routinely take (including pain medications). 6. No driving day of procedure. 7. Upon discharge go home and rest for at least 4 hours.  May use an ice pack as needed to injection sites on back. 8. Remove bandades later, today.    Please contact our office at [phone] for the following symptoms:   Fever greater than 100 degrees  Increased swelling, pain, or redness at injection site.   Thank you for visiting [facility].

## 2019-10-26 ENCOUNTER — Other Ambulatory Visit: Payer: Self-pay | Admitting: Sports Medicine

## 2019-10-26 DIAGNOSIS — J449 Chronic obstructive pulmonary disease, unspecified: Secondary | ICD-10-CM

## 2019-10-27 ENCOUNTER — Telehealth: Payer: Self-pay | Admitting: Internal Medicine

## 2019-10-27 NOTE — Telephone Encounter (Signed)
lvm for patient to return call to get follow up scheduled with [facility] from recall list 

## 2019-11-02 IMAGING — DX DG KNEE COMPLETE 4+V*L*
1 series · 1 of 1 positions shown · non-contrast
Comparison: None

CLINICAL DATA: Medial LEFT knee pain after bending 4 days ago,
osteoarthritis of LEFT patellofemoral joint

EXAM:
RIGHT KNEE - 1-2 VIEW; LEFT KNEE - COMPLETE 4+ VIEW

[knee ap bilat standing]
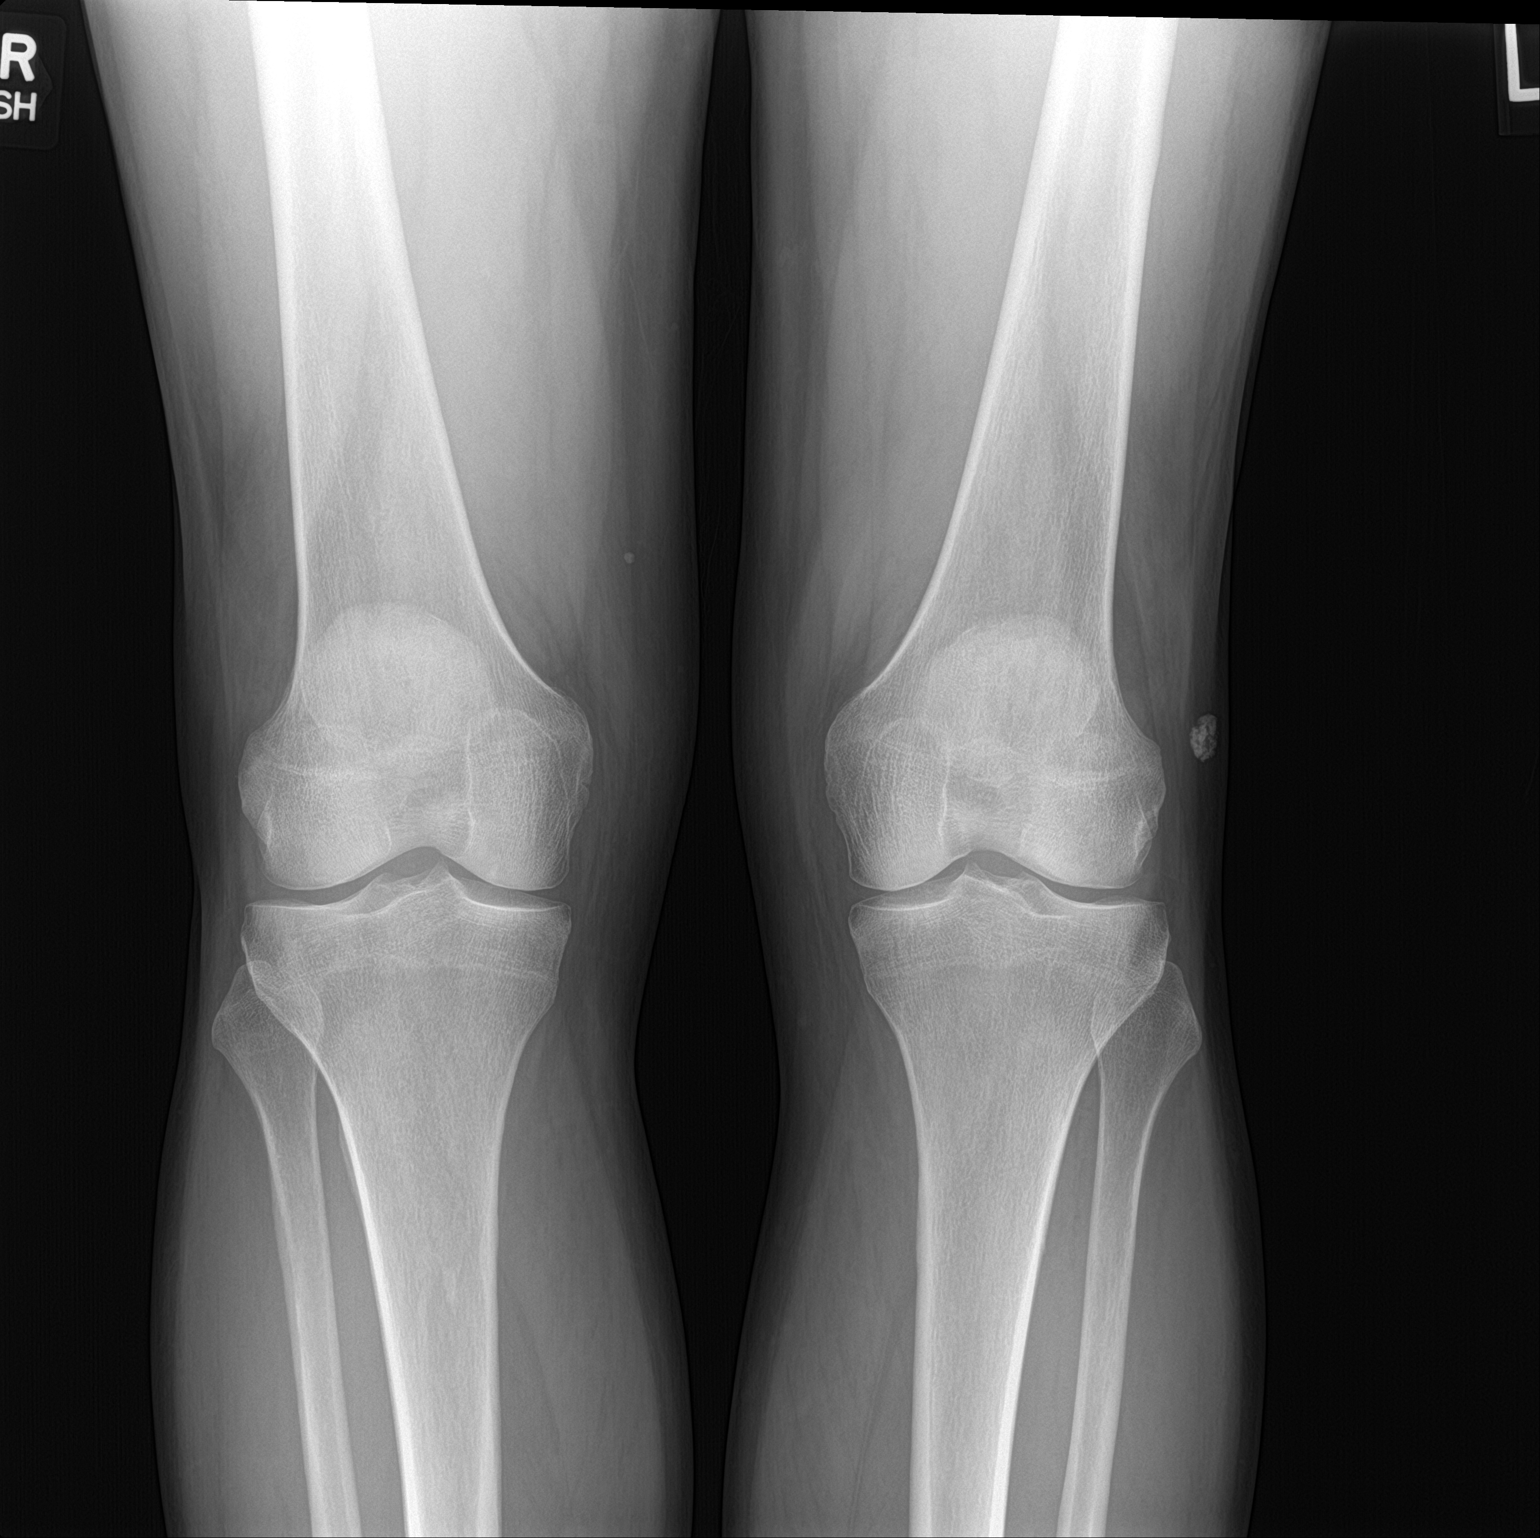

[1 of 1 positions shown; findings below may reference images not displayed]

FINDINGS: LEFT knee:

Osseous mineralization normal for technique.

Patellofemoral joint space narrowing at particularly lateral facet.

Joint spaces otherwise preserved.

No acute fracture, dislocation or bone destruction.

No knee joint effusion.

Nonspecific soft tissue calcification lateral to the distal LEFT
femur, 13 mm greatest size.

RIGHT knee:

Osseous mineralization normal for technique.

Joint spaces preserved.

No acute fracture dislocation identified on [facility] imaging.
IMPRESSION: Mild LEFT patellofemoral degenerative changes.

## 2019-11-18 ENCOUNTER — Other Ambulatory Visit: Payer: Self-pay | Admitting: Sports Medicine

## 2019-11-18 DIAGNOSIS — I2584 Coronary atherosclerosis due to calcified coronary lesion: Secondary | ICD-10-CM

## 2019-11-18 DIAGNOSIS — G43809 Other migraine, not intractable, without status migrainosus: Secondary | ICD-10-CM

## 2019-11-18 DIAGNOSIS — R072 Precordial pain: Secondary | ICD-10-CM

## 2019-11-18 DIAGNOSIS — I2510 Atherosclerotic heart disease of native coronary artery without angina pectoris: Secondary | ICD-10-CM

## 2019-11-20 ENCOUNTER — Ambulatory Visit (INDEPENDENT_AMBULATORY_CARE_PROVIDER_SITE_OTHER): Payer: BC Managed Care – PPO | Admitting: Sports Medicine

## 2019-11-20 ENCOUNTER — Other Ambulatory Visit: Payer: Self-pay

## 2019-11-20 DIAGNOSIS — M47816 Spondylosis without myelopathy or radiculopathy, lumbar region: Secondary | ICD-10-CM

## 2019-11-20 MED ORDER — TRAMADOL HCL ER 100 MG PO TB24: 100.0000 mg | ORAL_TABLET | Freq: Every day | ORAL | 2 refills | Status: DC

## 2019-11-20 NOTE — Assessment & Plan Note (Signed)
Joseph Jacobs has been through a lot, he has end-stage lumbar spondylosis, referable to his left L3 S1 facet joint injections, he had a good response to his medial branch blocks with Dr. Laurian Brim, ultimately we got his RFA approved with [facility] imaging, its been months and he still does not have sufficient relief, for this reason we are going to take a different direction, avoiding further interventional treatment and switching to medical pain management with tramadol per He does not really take it as often as he feels he should but he does get some relief when he takes his immediate release tramadol, for this reason we will switch to extended release tramadol starting at 100 mg, in a month we can go up to 200 mg if he desires. Ultimately we may need to get him on a narcotic contract.

## 2019-11-20 NOTE — Progress Notes (Signed)
    Procedures performed today:    None.  Independent interpretation of notes and tests performed by another provider:   None.  Brief History, Exam, Impression, and Recommendations:    Lumbar facet joint syndrome Joseph Jacobs has been through a lot, he has end-stage lumbar spondylosis, referable to his left L3 S1 facet joint injections, he had a good response to his medial branch blocks with Dr. Laurian Brim, ultimately we got his RFA approved with [facility] imaging, its been months and he still does not have sufficient relief, for this reason we are going to take a different direction, avoiding further interventional treatment and switching to medical pain management with tramadol per He does not really take it as often as he feels he should but he does get some relief when he takes his immediate release tramadol, for this reason we will switch to extended release tramadol starting at 100 mg, in a month we can go up to 200 mg if he desires. Ultimately we may need to get him on a narcotic contract.    ___________________________________________ Joseph Jacobs. Benjamin Stain, M.D., ABFM., CAQSM. Primary Care and Sports Medicine [facility]  Adjunct Instructor of Family Medicine  [employer]

## 2019-11-26 DIAGNOSIS — M1712 Unilateral primary osteoarthritis, left knee: Secondary | ICD-10-CM

## 2019-11-27 MED ORDER — DICLOFENAC SODIUM 75 MG PO TBEC: 75.0000 mg | DELAYED_RELEASE_TABLET | Freq: Two times a day (BID) | ORAL | 3 refills | Status: DC

## 2019-11-27 MED ORDER — TRAMADOL HCL 50 MG PO TABS: 50.0000 mg | ORAL_TABLET | Freq: Three times a day (TID) | ORAL | 0 refills | Status: DC | PRN

## 2019-11-27 NOTE — Addendum Note (Signed)
Addended by: Monica Becton on: 11/27/2019 02:20 PM   Modules accepted: Orders

## 2019-11-27 NOTE — Assessment & Plan Note (Signed)
Joseph Jacobs recently injured his left knee, I really think he just overdid it per his my chart note, adding some diclofenac with tramadol for breakthrough pain with instructions to see me at the end of the weekend for an exam if not sufficiently better. We can certainly consider an injection at that time.

## 2019-12-01 ENCOUNTER — Ambulatory Visit (INDEPENDENT_AMBULATORY_CARE_PROVIDER_SITE_OTHER): Payer: BC Managed Care – PPO

## 2019-12-01 ENCOUNTER — Other Ambulatory Visit: Payer: Self-pay

## 2019-12-01 ENCOUNTER — Ambulatory Visit (INDEPENDENT_AMBULATORY_CARE_PROVIDER_SITE_OTHER): Payer: BC Managed Care – PPO | Admitting: Sports Medicine

## 2019-12-01 DIAGNOSIS — M1712 Unilateral primary osteoarthritis, left knee: Secondary | ICD-10-CM

## 2019-12-01 NOTE — Assessment & Plan Note (Signed)
Joseph Jacobs continues to have left-sided knee pain, medial joint line, exam is benign with the exception of tenderness at the medial joint line. Minimal mechanical symptoms but no pain with terminal flexion, negative McMurray's on exam today. Due to failure of diclofenac we injected his knee, getting x-rays, return to see me in 1 month.

## 2019-12-01 NOTE — Progress Notes (Signed)
    Procedures performed today:    Procedure: Real-time Ultrasound Guided injection of the left knee Device: Samsung HS60  Verbal informed consent obtained.  Time-out conducted.  Noted no overlying erythema, induration, or other signs of local infection.  Skin prepped in a sterile fashion.  Local anesthesia: Topical Ethyl chloride.  With sterile technique and under real time ultrasound guidance: 1 cc Kenalog 40, 2 cc lidocaine, 2 cc bupivacaine injected easily Completed without difficulty  Pain immediately resolved suggesting accurate placement of the medication.  Advised to call if fevers/chills, erythema, induration, drainage, or persistent bleeding.  Images permanently stored and available for review in PACS.  Impression: Technically successful ultrasound guided injection.  Independent interpretation of notes and tests performed by another provider:   None.  Brief History, Exam, Impression, and Recommendations:    Osteoarthritis of left patellofemoral joint Joseph Jacobs continues to have left-sided knee pain, medial joint line, exam is benign with the exception of tenderness at the medial joint line. Minimal mechanical symptoms but no pain with terminal flexion, negative McMurray's on exam today. Due to failure of diclofenac we injected his knee, getting x-rays, return to see me in 1 month.    ___________________________________________ [name]. Benjamin Stain, M.D., ABFM., CAQSM. Primary Care and Sports Medicine [facility]  Adjunct Instructor of Family Medicine  [employer]

## 2019-12-06 ENCOUNTER — Emergency Department
Admission: EM | Admit: 2019-12-06 | Discharge: 2019-12-06 | Disposition: A | Discharge status: Home / Self Care | Payer: BC Managed Care – PPO | Source: Home / Self Care | Attending: Family Medicine | Admitting: Family Medicine

## 2019-12-06 ENCOUNTER — Other Ambulatory Visit: Payer: Self-pay

## 2019-12-06 DIAGNOSIS — L03113 Cellulitis of right upper limb: Secondary | ICD-10-CM

## 2019-12-06 MED ORDER — DOXYCYCLINE HYCLATE 100 MG PO CAPS
ORAL_CAPSULE | ORAL | 0 refills | Status: DC
Start: 2019-12-06 — End: 2021-10-04

## 2019-12-06 NOTE — ED Triage Notes (Signed)
Pt states that he has a elbow infection on the right elbow. It is red and painful. x1 day

## 2019-12-06 NOTE — ED Provider Notes (Signed)
[facility]    CSN: [id number] Arrival date & time: 12/06/19  0900      History   Chief Complaint Chief Complaint  Patient presents with  . Elbow infection    HPI Joseph Jacobs is a 64 y.o. male.   Patient noticed soreness and redness of his right posterior elbow yesterday but recalls no injury.  He denies fevers, chills, and sweats.  The history is provided by the patient.    Past Medical History:  Diagnosis Date  . Anginal pain (HCC)   . Arthritis    "hands, knees, back, rightt foot" (10/11/2016)  . Asthma   . Chronic bronchitis (HCC)    "get it most years" (10/11/2016)  . Chronic lower back pain   . Coronary artery disease   . High cholesterol   . Lumbar degenerative disc disease 12/14/2013  . Migraine    "daily to weekly" (10/11/2016)  . Pneumonia    "several times; last time was in 03/2016" (10/11/2016)  . Sleep apnea   . Stress fracture of foot 12/14/2013   "right"    Patient Active Problem List   Diagnosis Date Noted  . Hearing loss due to cerumen impaction, bilateral 04/27/2019  . Impingement syndrome, shoulder, left 10/01/2018  . Tick bite 08/06/2018  . Cervical spondylosis with radiculopathy 07/09/2018  . Chronic pain disorder 07/09/2018  . Neural foraminal stenosis of cervical spine 07/09/2018  . Controlled type 2 diabetes mellitus without complication, without long-term current use of insulin (HCC) 01/16/2018  . Benign essential hypertension 01/14/2018  . Sleep apnea   . High cholesterol   . CAD in native artery   . Unstable angina (HCC)   . Subacromial bursitis of right shoulder joint 12/31/2016  . 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 Osteoarthritis of left patellofemoral joint 12/19/2016  . Forgetfulness 12/19/2016  . Palpitations 11/02/2016  . Osteoarthritis of hands, bilateral 10/29/2016  . COPD mixed type (HCC) 10/16/2016  . Diastolic heart failure of unknown etiology (HCC)  10/11/2016  . Coronary artery calcification 09/29/2016  . Dyslipidemia 09/29/2016  . Former smoker 09/29/2016  . Presence of stent in coronary artery in patient with coronary artery disease 09/26/2016  . Annual physical exam 09/03/2016  . Metatarsalgia of right foot 06/28/2016  . Pain of right sacroiliac joint 03/08/2016  . Primary osteoarthritis of right hip 05/30/2015  . Memory loss 12/13/2014  . Osteoarthritis of right glenohumeral joint 01/25/2014  . Migraine 12/14/2013  . Lumbar facet joint syndrome 12/14/2013  . Primary osteoarthritis of right foot 12/14/2013  . Seasonal allergies 12/14/2013  . Stress fracture of foot 12/14/2013  . DDD (degenerative disc disease), cervical 08/11/2008    Past Surgical History:  Procedure Laterality Date  . BILATERAL CARPAL TUNNEL RELEASE Bilateral   . CORONARY ANGIOPLASTY WITH STENT PLACEMENT  10/11/2016  . CORONARY STENT INTERVENTION N/A 10/11/2016   Procedure: CORONARY STENT INTERVENTION;  Surgeon: Runell Gess, MD;  Location: [facility];  Service: Cardiovascular;  Laterality: N/A;  prox RCA  . KNEE ARTHROSCOPY Left 1978  . KNEE CARTILAGE SURGERY Left   . LEFT HEART CATH AND CORONARY ANGIOGRAPHY N/A 10/11/2016   Procedure: LEFT HEART CATH AND CORONARY ANGIOGRAPHY;  Surgeon: Runell Gess, MD;  Location: [facility] CV [facility];  Service: Cardiovascular;  Laterality: N/A;       Home Medications    Prior to Admission medications   Medication Sig Start Date End Date Taking? Authorizing Provider  acetaminophen (TYLENOL) 325 MG tablet Take 650 mg by mouth every 6 (six) hours as needed.   Yes [provider]  albuterol (VENTOLIN HFA) 108 (90 Base) MCG/ACT inhaler INHALE TWO PUFFS BY MOUTH EVERY 6 HOURS AS NEEDED FOR WHEEZING OR SHORTNESS OF BREATH 10/26/19  Yes Monica Becton, MD  AMBULATORY NON FORMULARY MEDICATION Single glucometer with lancets, test strips 03/04/18  Yes Monica Becton, MD  aspirin EC 81 MG  tablet Take 1 tablet (81 mg total) by mouth daily. Patient taking differently: Take 81 mg by mouth every evening.  07/06/15  Yes Hommel, Sean, DO  atorvastatin (LIPITOR) 80 MG tablet TAKE ONE TABLET BY MOUTH EVERY NIGHT AT BEDTIME 08/31/19  Yes Monica Becton, MD  diclofenac (VOLTAREN) 75 MG EC tablet Take 1 tablet (75 mg total) by mouth 2 (two) times daily. 11/27/19 11/26/20 Yes Monica Becton, MD  Diclofenac Sodium 3 % GEL Apply 1 application topically in the morning and at bedtime. 07/02/19  Yes Monica Becton, MD  metFORMIN (GLUCOPHAGE-XR) 500 MG 24 hr tablet TAKE ONE TABLET BY MOUTH EVERY MORNING WITH BREAKFAST 01/19/19  Yes Monica Becton, MD  metoprolol succinate (TOPROL-XL) 25 MG 24 hr tablet TAKE ONE TABLET BY MOUTH DAILY 11/18/19  Yes Monica Becton, MD  montelukast (SINGULAIR) 10 MG tablet TAKE ONE TABLET BY MOUTH AT BEDTIME 11/18/19  Yes Monica Becton, MD  OZEMPIC, 0.25 OR 0.5 MG/DOSE, 2 MG/1.5ML SOPN DIAL AND INJECT UNDER THE SKIN 0.25 MG WEEKLY FOR 4 WEEKS THEN DIAL AND INJECT 0.5 MG WEEKLY THEREAFTER 09/07/19  Yes Monica Becton, MD  traMADol (ULTRAM) 50 MG tablet Take 1-2 tablets (50-100 mg total) by mouth every 8 (eight) hours as needed for moderate pain. Maximum 6 tabs per day. 11/27/19  Yes Monica Becton, MD  traMADol (ULTRAM-ER) 100 MG 24 hr tablet Take 1 tablet (100 mg total) by mouth daily. 11/20/19  Yes Monica Becton, MD  albuterol (PROVENTIL) (2.5 MG/3ML) 0.083% nebulizer solution Take 3 mLs (2.5 mg total) by nebulization every 6 (six) hours as needed for wheezing or shortness of breath. 04/24/16   Monica Becton, MD  cyclobenzaprine (FLEXERIL) 10 MG tablet One half tab PO qHS, then increase gradually to one tab TID. 06/13/18   Monica Becton, MD  doxycycline (VIBRAMYCIN) 100 MG capsule Take one cap PO Q12hr with food. 12/06/19   Lattie Haw, MD  fluticasone (FLONASE) 50 MCG/ACT nasal spray One  spray in each nostril twice a day, use left hand for right nostril, and right hand for left nostril. 10/22/16   Monica Becton, MD  nitroGLYCERIN (NITROSTAT) 0.4 MG SL tablet Place 1 tablet (0.4 mg total) under the tongue every 5 (five) minutes as needed for chest pain. 05/26/18   Monica Becton, MD  SUMAtriptan (IMITREX) 100 MG tablet TAKE ONE TABLET BY MOUTH AT ONSET OF HEADACHE; MAY REPEAT ONE TABLET IN 2 HOURS IF NEEDED. 10/26/19   Monica Becton, MD  SUMAtriptan-naproxen (TREXIMET) 85-500 MG tablet One-half to one tab PO at the first sign of migraine, may repeat x1 in 2 hours if headache still present. 01/31/18   Monica Becton, MD    Family History Family History  Problem Relation Age of Onset  . Brain cancer Father   . Alcoholism Mother   . Diabetes Mother        father   . Lung cancer Mother   . Cancer Other   . Cancer -  Prostate Brother     Social History Social History   Tobacco Use  . Smoking status: Former Smoker    Packs/day: 0.33    Years: 44.00    Pack years: 14.52    Types: Cigarettes    Quit date: 09/29/2016    Years since quitting: 3.1  . Smokeless tobacco: Never Used  Vaping Use  . Vaping Use: Never used  Substance Use Topics  . Alcohol use: Yes    Alcohol/week: 6.0 standard drinks    Types: 3 Glasses of wine, 3 Standard drinks or equivalent per week  . Drug use: No     Allergies   Azithromycin, Ciprofloxacin, and E-mycin [erythromycin]   Review of Systems Review of Systems  Constitutional: Negative for activity change, appetite change, chills, diaphoresis, fatigue and fever.  Musculoskeletal: Negative for joint swelling and myalgias.  Skin: Positive for color change. Negative for wound.  All other systems reviewed and are negative.    Physical Exam Triage Vital Signs ED Triage Vitals  Enc Vitals Group     BP 12/06/19 0924 135/84     Pulse Rate 12/06/19 0924 75     Resp --      Temp 12/06/19 0924 98.3 F (36.8 C)      Temp Source 12/06/19 0924 Oral     SpO2 12/06/19 0924 97 %     Weight 12/06/19 0920 180 lb (81.6 kg)     Height 12/06/19 0920 5\' 11"  (1.803 m)     Head Circumference --      Peak Flow --      Pain Score 12/06/19 0920 3     Pain Loc --      Pain Edu? --      Excl. in GC? --    No data found.  Updated Vital Signs BP 135/84 (BP Location: Right Arm)   Pulse 75   Temp 98.3 F (36.8 C) (Oral)   Ht 5\' 11"  (1.803 m)   Wt 81.6 kg   SpO2 97%   BMI 25.10 kg/m   Visual Acuity Right Eye Distance:   Left Eye Distance:   Bilateral Distance:    Right Eye Near:   Left Eye Near:    Bilateral Near:     Physical Exam Vitals and nursing note reviewed.  Constitutional:      General: He is not in acute distress. HENT:     Head: Normocephalic.     Mouth/Throat:     Mouth: Mucous membranes are moist.  Eyes:     Conjunctiva/sclera: Conjunctivae normal.     Pupils: Pupils are equal, round, and reactive to light.  Cardiovascular:     Rate and Rhythm: Normal rate.  Pulmonary:     Effort: Pulmonary effort is normal.  Musculoskeletal:     Right elbow: Swelling present. No effusion. Normal range of motion. Tenderness present.       Arms:     Comments: Right posterior elbow has erythema, warmth, and tenderness to palpation as noted on diagram.  There is no swelling of the right olecranon bursa. There is tenderness to palpation over right axillary nodes.  Skin:    General: Skin is warm and dry.  Neurological:     General: No focal deficit present.     Mental Status: He is alert.      UC Treatments / Results  Labs (all labs ordered are listed, but only abnormal results are displayed) Labs Reviewed - No data to display  EKG  Radiology No results found.  Procedures Procedures (including critical care time)  Medications Ordered in UC Medications - No data to display  Initial Impression / Assessment and Plan / UC Course  I have reviewed the triage vital signs and the  nursing notes.  Pertinent labs & imaging results that were available during my care of the patient were reviewed by me and considered in my medical decision making (see chart for details).    Begin doxycycline for staph coverage. Followup with Family Doctor if not improved in about 5 days.   Final Clinical Impressions(s) / UC Diagnoses   Final diagnoses:  Cellulitis of right elbow     Discharge Instructions     Increase fluid intake.  Apply warm compress several times daily.  May take Tylenol if needed for pain.  If symptoms become significantly worse during the night or over the weekend, proceed to the local emergency room.     ED Prescriptions    Medication Sig Dispense Auth. Provider   doxycycline (VIBRAMYCIN) 100 MG capsule Take one cap PO Q12hr with food. 20 capsule Lattie HawBeese, Ameris Akamine A, MD        Lattie HawBeese, Ragina Fenter A, MD 12/13/19 [phone]

## 2019-12-06 NOTE — Discharge Instructions (Addendum)
Increase fluid intake.  Apply warm compress several times daily.  May take Tylenol if needed for pain.  If symptoms become significantly worse during the night or over the weekend, proceed to the local emergency room.

## 2019-12-07 ENCOUNTER — Encounter: Payer: Self-pay | Admitting: Sports Medicine

## 2019-12-07 ENCOUNTER — Ambulatory Visit (INDEPENDENT_AMBULATORY_CARE_PROVIDER_SITE_OTHER): Payer: BC Managed Care – PPO | Admitting: Sports Medicine

## 2019-12-07 DIAGNOSIS — L03113 Cellulitis of right upper limb: Secondary | ICD-10-CM | POA: Insufficient documentation

## 2019-12-07 MED ORDER — PREDNISONE 50 MG PO TABS: ORAL_TABLET | ORAL | 0 refills | Status: DC

## 2019-12-07 NOTE — Patient Instructions (Signed)

## 2019-12-07 NOTE — Assessment & Plan Note (Signed)
Last week increase developed swelling and pain in his right elbow, he was seen in urgent care and diagnosed with cellulitis, started appropriately on doxycycline, and is here today, feeling a little bit better. Clinically this appears to be a standard cellulitis, I do not think there is an olecranon bursitis or a crystalline process at play. Pain is referring up to the shoulder, I think this will go away as his cellulitis improves. Continue doxycycline, adding 5 days of prednisone, warm compresses, he has some tramadol at home. Return to see me if needed.

## 2019-12-07 NOTE — Progress Notes (Signed)
    Procedures performed today:    None.  Independent interpretation of notes and tests performed by another provider:   None.  Brief History, Exam, Impression, and Recommendations:    Cellulitis of right elbow Last week increase developed swelling and pain in his right elbow, he was seen in urgent care and diagnosed with cellulitis, started appropriately on doxycycline, and is here today, feeling a little bit better. Clinically this appears to be a standard cellulitis, I do not think there is an olecranon bursitis or a crystalline process at play. Pain is referring up to the shoulder, I think this will go away as his cellulitis improves. Continue doxycycline, adding 5 days of prednisone, warm compresses, he has some tramadol at home. Return to see me if needed.    ___________________________________________ [name]. Benjamin Stain, M.D., ABFM., CAQSM. Primary Care and Sports Medicine [facility]  Adjunct Instructor of Family Medicine  [employer]

## 2019-12-28 ENCOUNTER — Other Ambulatory Visit: Payer: Self-pay | Admitting: Sports Medicine

## 2019-12-28 DIAGNOSIS — E119 Type 2 diabetes mellitus without complications: Secondary | ICD-10-CM

## 2019-12-29 ENCOUNTER — Other Ambulatory Visit: Payer: Self-pay

## 2019-12-29 ENCOUNTER — Encounter: Payer: Self-pay | Admitting: Internal Medicine

## 2019-12-29 ENCOUNTER — Ambulatory Visit: Payer: BC Managed Care – PPO | Admitting: Internal Medicine

## 2019-12-29 VITALS — BP 123/74 | HR 89 | Temp 98.1°F | Ht 71.0 in | Wt 193.2 lb

## 2019-12-29 DIAGNOSIS — E785 Hyperlipidemia, unspecified: Secondary | ICD-10-CM | POA: Diagnosis not present

## 2019-12-29 DIAGNOSIS — E782 Mixed hyperlipidemia: Secondary | ICD-10-CM

## 2019-12-29 DIAGNOSIS — R079 Chest pain, unspecified: Secondary | ICD-10-CM | POA: Diagnosis not present

## 2019-12-29 DIAGNOSIS — I2510 Atherosclerotic heart disease of native coronary artery without angina pectoris: Secondary | ICD-10-CM | POA: Diagnosis not present

## 2019-12-29 LAB — LIPID PANEL
Chol/HDL Ratio: 3.5 ratio (ref 0.0–5.0)
Cholesterol, Total: 149 mg/dL (ref 100–199)
HDL: 43 mg/dL (ref >39)
LDL Chol Calc (NIH): 71 mg/dL (ref 0–99)
Triglycerides: 214 mg/dL — ABNORMAL HIGH (ref 0–149)
VLDL Cholesterol Cal: 35 mg/dL (ref 5–40)

## 2019-12-29 LAB — COMPREHENSIVE METABOLIC PANEL
ALT: 43 IU/L (ref 0–44)
AST: 25 IU/L (ref 0–40)
Albumin/Globulin Ratio: 2.2 (ref 1.2–2.2)
Albumin: 4.2 g/dL (ref 3.8–4.8)
Alkaline Phosphatase: 94 IU/L (ref 44–121)
BUN/Creatinine Ratio: 12 (ref 10–24)
BUN: 11 mg/dL (ref 8–27)
Bilirubin Total: 0.3 mg/dL (ref 0.0–1.2)
CO2: 28 mmol/L (ref 20–29)
Calcium: 9.6 mg/dL (ref 8.6–10.2)
Chloride: 103 mmol/L (ref 96–106)
Creatinine, Ser: 0.93 mg/dL (ref 0.76–1.27)
GFR calc Af Amer: 101 mL/min/{1.73_m2} (ref >59)
GFR calc non Af Amer: 87 mL/min/{1.73_m2} (ref >59)
Globulin, Total: 1.9 g/dL (ref 1.5–4.5)
Glucose: 100 mg/dL — ABNORMAL HIGH (ref 65–99)
Potassium: 5 mmol/L (ref 3.5–5.2)
Sodium: 143 mmol/L (ref 134–144)
Total Protein: 6.1 g/dL (ref 6.0–8.5)

## 2019-12-29 MED ORDER — NITROGLYCERIN 0.4 MG SL SUBL: 0.4000 mg | SUBLINGUAL_TABLET | SUBLINGUAL | 3 refills | Status: DC | PRN

## 2019-12-29 NOTE — Progress Notes (Signed)
OFFICE CONSULT NOTE  Chief Complaint:  No complaints  Primary Care Physician: Monica Becton, MD  HPI:  Joseph Jacobs is a 64 y.o. male who is being seen today for the evaluation of chest pain at the request of Monica Becton Joseph Jacobs was referred for evaluation of chest and arm pain. He has a history of asthma and migraine headaches. He's under a lot of stress and works as Catering manager of admissions for [employer]. 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 He is a quarter pack per day smoker 30 years and is originally from [place]. His only exercise is playing golf several times a week but he still manages to score in the 70s. Recently he's had some chest pressure. He says it comes on with or without exertion and is relieved by rest. Sometimes he has radiating symptoms down the left arm, associated with some numbness and tingling. There is a family history of diabetes but no coronary artery disease. Both parents had cancer. He denies any worsening shortness of breath with exertion. Recent labs indicate an LDL of 104. He also had a CT angiogram of the chest to rule out a pulmonary embolism in June 2017 after similar symptoms of chest pain. This demonstrated calcification in the LAD. He was seen by Carlean Jews, PA-C, who ordered a Lexiscan Myoview on 07/19/2015 which showed normal LV function and no ischemia.  01/28/2017  Joseph Jacobs returns today for follow-up.  He was referred for left heart catheterization and coronary angiography and underwent PCI to the RCA which is 95% stenotic and had drug-eluting stent placement on 10/11/2016.  Subsequently he is done well without any recurrent anginal symptoms.  He had reported some palpitations about a month afterwards and underwent a monitor which showed occasional PACs and PVCs.  There is a brief nonsustained atrial run.  Medications were adjusted and he is done well.  Recently a lipid  profile was obtained showing total cholesterol 130, triglycerides 63, HDL 72, LDL 52.  He is participating cardiac rehab and denies any worsening shortness of breath or chest pain.  He still on dual antiplatelet therapy with aspirin and Brilinta.  He is on high-dose atorvastatin and metoprolol.  10/14/2017  Joseph Jacobs is seen today in follow-up.  He continues to do well after PCI in September 2018.  He denies any further chest pain or worsening shortness of breath.  He is been able to resume exercise without any difficulty.  He does report easy bruising, likely due to being on dual antiplatelet therapy.  Recent lipid profile shows excellent control with total cholesterol 130, HDL 63, triglycerides 72 and LDL 52.  He is on high intensity atorvastatin 80 mg.  12/29/2019  Joseph Jacobs is seen today in follow-up.  He recently was in the ER with an infected right elbow.  He was on doxycycline for that.  He denies any chest pain but did have a couple of chest twinges over the past year.  None of which sounds like angina.  He had not taken nitroglycerin for it but is requesting a refill of that.  His cholesterol has been quite low and he is due for repeat lipid profile.  He is on high potency atorvastatin.  Blood pressure is well controlled.  PMHx:  Past Medical History:  Diagnosis Date   Anginal pain (HCC)    Arthritis    "hands, knees, back, rightt foot" (10/11/2016)   Asthma  Chronic bronchitis (HCC)    "get it most years" (10/11/2016)   Chronic lower back pain    Coronary artery disease    High cholesterol    Lumbar degenerative disc disease 12/14/2013   Migraine    "daily to weekly" (10/11/2016)   Pneumonia    "several times; last time was in 03/2016" (10/11/2016)   Sleep apnea    Stress fracture of foot 12/14/2013   "right"    Past Surgical History:  Procedure Laterality Date   BILATERAL CARPAL TUNNEL RELEASE Bilateral    CORONARY ANGIOPLASTY WITH STENT PLACEMENT  10/11/2016    CORONARY STENT INTERVENTION N/A 10/11/2016   Procedure: CORONARY STENT INTERVENTION;  Surgeon: Runell Gess, MD;  Location: [facility];  Service: Cardiovascular;  Laterality: N/A;  prox RCA   KNEE ARTHROSCOPY Left 1978   KNEE CARTILAGE SURGERY Left    LEFT HEART CATH AND CORONARY ANGIOGRAPHY N/A 10/11/2016   Procedure: LEFT HEART CATH AND CORONARY ANGIOGRAPHY;  Surgeon: Runell Gess, MD;  Location: [facility];  Service: Cardiovascular;  Laterality: N/A;    FAMHx:  Family History  Problem Relation Age of Onset   Brain cancer Father    Alcoholism Mother    Diabetes Mother        father    Lung cancer Mother    Cancer Other    Cancer - Prostate Brother     SOCHx:   reports that he quit smoking about 3 years ago. His smoking use included cigarettes. He has a 14.52 pack-year smoking history. He has never used smokeless tobacco. He reports current alcohol use of about 6.0 standard drinks of alcohol per week. He reports that he does not use drugs.  ALLERGIES:  Allergies  Allergen Reactions   Azithromycin Swelling    Tingling/swelling in lips/tongue   Ciprofloxacin     Abdominal pain   E-Mycin [Erythromycin] Diarrhea and Nausea Only    GI upset     ROS: Pertinent items noted in HPI and remainder of comprehensive ROS otherwise negative.  HOME MEDS: Current Outpatient Medications on File Prior to Visit  Medication Sig Dispense Refill   acetaminophen (TYLENOL) 325 MG tablet Take 650 mg by mouth every 6 (six) hours as needed.     albuterol (PROVENTIL) (2.5 MG/3ML) 0.083% nebulizer solution Take 3 mLs (2.5 mg total) by nebulization every 6 (six) hours as needed for wheezing or shortness of breath. 75 mL 12   albuterol (VENTOLIN HFA) 108 (90 Base) MCG/ACT inhaler INHALE TWO PUFFS BY MOUTH EVERY 6 HOURS AS NEEDED FOR WHEEZING OR SHORTNESS OF BREATH 18 g 0   AMBULATORY NON FORMULARY MEDICATION Single glucometer with lancets, test strips 1 each 0     aspirin EC 81 MG tablet Take 1 tablet (81 mg total) by mouth daily. (Patient taking differently: Take 81 mg by mouth every evening. )     atorvastatin (LIPITOR) 80 MG tablet TAKE ONE TABLET BY MOUTH EVERY NIGHT AT BEDTIME 90 tablet 0   cyclobenzaprine (FLEXERIL) 10 MG tablet One half tab PO qHS, then increase gradually to one tab TID. 30 tablet 0   diclofenac (VOLTAREN) 75 MG EC tablet Take 1 tablet (75 mg total) by mouth 2 (two) times daily. 60 tablet 3   Diclofenac Sodium 3 % GEL Apply 1 application topically in the morning and at bedtime. 100 g 11   doxycycline (VIBRAMYCIN) 100 MG capsule Take one cap PO Q12hr with food. 20 capsule 0   fluticasone (  FLONASE) 50 MCG/ACT nasal spray One spray in each nostril twice a day, use left hand for right nostril, and right hand for left nostril. 48 g 3   metFORMIN (GLUCOPHAGE-XR) 500 MG 24 hr tablet TAKE ONE TABLET BY MOUTH EVERY MORNING WITH BREAKFAST 30 tablet 10   metoprolol succinate (TOPROL-XL) 25 MG 24 hr tablet TAKE ONE TABLET BY MOUTH DAILY 90 tablet 0   montelukast (SINGULAIR) 10 MG tablet TAKE ONE TABLET BY MOUTH AT BEDTIME 90 tablet 0   nitroGLYCERIN (NITROSTAT) 0.4 MG SL tablet Place 1 tablet (0.4 mg total) under the tongue every 5 (five) minutes as needed for chest pain. 25 tablet 0   OZEMPIC, 0.25 OR 0.5 MG/DOSE, 2 MG/1.5ML SOPN DIAL AND INJECT UNDER THE SKIN 0.25 MG WEEKLY FOR 4 WEEKS THEN DIAL AND INJECT 0.5 MG WEEKLY THEREAFTER 4.5 mL 11   predniSONE (DELTASONE) 50 MG tablet One tab PO daily for 5 days. 5 tablet 0   SUMAtriptan (IMITREX) 100 MG tablet TAKE ONE TABLET BY MOUTH AT ONSET OF HEADACHE; MAY REPEAT ONE TABLET IN 2 HOURS IF NEEDED. 18 tablet 11   SUMAtriptan-naproxen (TREXIMET) 85-500 MG tablet One-half to one tab PO at the first sign of migraine, may repeat x1 in 2 hours if headache still present. 10 tablet 0   traMADol (ULTRAM) 50 MG tablet Take 1-2 tablets (50-100 mg total) by mouth every 8 (eight) hours as needed  for moderate pain. Maximum 6 tabs per day. 21 tablet 0   traMADol (ULTRAM-ER) 100 MG 24 hr tablet Take 1 tablet (100 mg total) by mouth daily. 30 tablet 2   No current facility-administered medications on file prior to visit.    LABS/IMAGING: No results found for this or any previous visit (from the past 48 hour(s)). No results found.  LIPID PANEL:    Component Value Date/Time   CHOL 107 10/17/2018 0853   TRIG 135 10/17/2018 0853   HDL 40 10/17/2018 0853   CHOLHDL 2.7 10/17/2018 0853   CHOLHDL 2.9 01/15/2018 0816   VLDL 36 (H) 07/06/2015 0939   LDLCALC 43 10/17/2018 0853   LDLCALC 82 01/15/2018 0816    WEIGHTS: Wt Readings from Last 3 Encounters:  12/29/19 193 lb 3.2 oz (87.6 kg)  12/06/19 180 lb (81.6 kg)  12/31/18 182 lb (82.6 kg)    VITALS: BP 123/74    Pulse 89    Temp 98.1 F (36.7 C)    Ht 5\' 11"  (1.803 m)    Wt 193 lb 3.2 oz (87.6 kg)    SpO2 98%    BMI 26.95 kg/m   EXAM: General appearance: alert and no distress Neck: no carotid bruit, no JVD and thyroid not enlarged, symmetric, no tenderness/mass/nodules Lungs: clear to auscultation bilaterally Heart: regular rate and rhythm, S1, S2 normal, no murmur, click, rub or gallop Abdomen: soft, non-tender; bowel sounds normal; no masses,  no organomegaly Extremities: extremities normal, atraumatic, no cyanosis or edema Pulses: 2+ and symmetric Skin: Skin color, texture, turgor normal. No rashes or lesions Neurologic: Grossly normal Psych: Pleasant  EKG: Normal sinus rhythm at 89, low voltage QRS-personally reviewed  ASSESSMENT: 1. Unstable angina-status post PCI to the RCA with DES (09/2016) 2. Tobacco abuse -quit 3. Calcification of the LAD 4. Dyslipidemia 5. Mild bilateral carotid artery disease (09/2018)  PLAN: 1.   Joseph Jacobs seems to be doing well without any recurrent chest pain.  He was noted to have some mild bilateral carotid disease by Dopplers in 2020.  He  gets some occasional chest twinges but no  real angina.  We will refill his nitroglycerin to use as needed.  He is due for repeat lipid profile which we will obtain today.  Otherwise no changes to his medications.  Blood pressure is at goal.  Plan follow-up with me annually or sooner as necessary  Chrystie Nose, MD, Milagros Loll  [place]   [facility]  Medical Director of the Advanced Lipid Disorders &  Cardiovascular Risk Reduction Clinic Attending Cardiologist  Direct Dial: [phone]   Fax: [phone]  Website:  [email or web address] 12/29/2019, 9:52 AM

## 2019-12-29 NOTE — Patient Instructions (Addendum)
Medication Instructions:  Your physician recommends that you continue on your current medications as directed. Please refer to the Current Medication list given to you today.  *If you need a refill on your cardiac medications before your next appointment, please call your pharmacy*   Lab Work: Lipid Panel & CMET  If you have labs (blood work) drawn today and your tests are completely normal, you will receive your results only by: Marland Kitchen MyChart Message (if you have MyChart) OR . A paper copy in the mail If you have any lab test that is abnormal or we need to change your treatment, we will call you to review the results.   Testing/Procedures: NONE   Follow-Up: At [facility], you and your health needs are our priority.  As part of our continuing mission to provide you with exceptional heart care, we have created designated Provider Care Teams.  These Care Teams include your primary Cardiologist (physician) and Advanced Practice Providers (APPs -  Physician Assistants and Nurse Practitioners) who all work together to provide you with the care you need, when you need it.  We recommend signing up for the patient portal called "MyChart".  Sign up information is provided on this After Visit Summary.  MyChart is used to connect with patients for Virtual Visits (Telemedicine).  Patients are able to view lab/test results, encounter notes, upcoming appointments, etc.  Non-urgent messages can be sent to your provider as well.   To learn more about what you can do with MyChart, go to [email or web address].    Your next appointment:   12 month(s)  The format for your next appointment:   In Person  Provider:   You may see Chrystie Nose, MD or one of the following Advanced Practice Providers on your designated Care Team:    Azalee Course, PA-C  Micah Flesher, PA-C or   Judy Pimple, [place]    Other Instructions

## 2019-12-30 ENCOUNTER — Ambulatory Visit (INDEPENDENT_AMBULATORY_CARE_PROVIDER_SITE_OTHER): Payer: BC Managed Care – PPO | Admitting: Sports Medicine

## 2019-12-30 DIAGNOSIS — Z8042 Family history of malignant neoplasm of prostate: Secondary | ICD-10-CM | POA: Insufficient documentation

## 2019-12-30 DIAGNOSIS — M1712 Unilateral primary osteoarthritis, left knee: Secondary | ICD-10-CM | POA: Diagnosis not present

## 2019-12-30 DIAGNOSIS — M47816 Spondylosis without myelopathy or radiculopathy, lumbar region: Secondary | ICD-10-CM | POA: Diagnosis not present

## 2019-12-30 DIAGNOSIS — L03113 Cellulitis of right upper limb: Secondary | ICD-10-CM

## 2019-12-30 NOTE — Assessment & Plan Note (Signed)
Joseph Jacobs had some labs done with his cardiologist through [facility] yesterday, we will see if we can add on a PSA to the blood already in the lab.

## 2019-12-30 NOTE — Assessment & Plan Note (Signed)
Improved with injection however still having occasional mechanical symptoms, catching, buckling. We are going to go ahead and proceed with MRI for arthroscopy planning, he likely has a meniscal tear. He would like to schedule the MRI for after the new year.

## 2019-12-30 NOTE — Assessment & Plan Note (Signed)
Improved considerably. We did steroids and he had already been on doxycycline. 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

## 2019-12-30 NOTE — Progress Notes (Signed)
    Procedures performed today:    None.  Independent interpretation of notes and tests performed by another provider:   None.  Brief History, Exam, Impression, and Recommendations:    Cellulitis of right elbow Improved considerably. We did steroids and he had already been on doxycycline. 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  Osteoarthritis of left patellofemoral joint Improved with injection however still having occasional mechanical symptoms, catching, buckling. We are going to go ahead and proceed with MRI for arthroscopy planning, he likely has a meniscal tear. He would like to schedule the MRI for after the new year.  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 Allison Quarry on traction and other manipulations before we consider any additional intervention.  Family history of prostate cancer Thayer Ohm had some labs done with his cardiologist through [facility] yesterday, we will see if we can add on a PSA to the blood already in the lab.    ___________________________________________ [name]. Benjamin Stain, M.D., ABFM., CAQSM. Primary Care and Sports Medicine [place] [facility]  Adjunct Instructor of Family Medicine  [employer]

## 2019-12-30 NOTE — Assessment & Plan Note (Signed)
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 Allison Quarry on traction and other manipulations before we consider any additional intervention.

## 2019-12-31 LAB — SPECIMEN STATUS REPORT

## 2019-12-31 LAB — PSA, TOTAL AND FREE
PSA, Free Pct: 36.7 %
PSA, Free: 0.44 ng/mL
Prostate Specific Ag, Serum: 1.2 ng/mL (ref 0.0–4.0)

## 2020-02-03 ENCOUNTER — Other Ambulatory Visit: Payer: Self-pay | Admitting: Sports Medicine

## 2020-02-03 DIAGNOSIS — J449 Chronic obstructive pulmonary disease, unspecified: Secondary | ICD-10-CM

## 2020-03-06 ENCOUNTER — Other Ambulatory Visit: Payer: Self-pay | Admitting: Sports Medicine

## 2020-03-06 DIAGNOSIS — I2510 Atherosclerotic heart disease of native coronary artery without angina pectoris: Secondary | ICD-10-CM

## 2020-03-06 DIAGNOSIS — R072 Precordial pain: Secondary | ICD-10-CM

## 2020-03-06 DIAGNOSIS — G43809 Other migraine, not intractable, without status migrainosus: Secondary | ICD-10-CM

## 2020-03-06 DIAGNOSIS — I2584 Coronary atherosclerosis due to calcified coronary lesion: Secondary | ICD-10-CM

## 2020-03-28 ENCOUNTER — Ambulatory Visit: Payer: BC Managed Care – PPO | Admitting: Sports Medicine

## 2020-03-28 ENCOUNTER — Other Ambulatory Visit: Payer: Self-pay

## 2020-03-28 ENCOUNTER — Encounter: Payer: Self-pay | Admitting: Sports Medicine

## 2020-03-28 ENCOUNTER — Ambulatory Visit (INDEPENDENT_AMBULATORY_CARE_PROVIDER_SITE_OTHER): Payer: BC Managed Care – PPO

## 2020-03-28 DIAGNOSIS — M7542 Impingement syndrome of left shoulder: Secondary | ICD-10-CM

## 2020-03-28 DIAGNOSIS — M1811 Unilateral primary osteoarthritis of first carpometacarpal joint, right hand: Secondary | ICD-10-CM | POA: Diagnosis not present

## 2020-03-28 NOTE — Progress Notes (Signed)
    Procedures performed today:    Procedure: Real-time Ultrasound Guided injection of the right first [facility] Device: Samsung HS60  Verbal informed consent obtained.  Time-out conducted.  Noted no overlying erythema, induration, or other signs of local infection.  Skin prepped in a sterile fashion.  Local anesthesia: Topical Ethyl chloride.  With sterile technique and under real time ultrasound guidance:  Noted arthritic trapezium, 1/2 cc lidocaine, 1/2 cc kenalog 40 injected easily.   Completed without difficulty  Advised to call if fevers/chills, erythema, induration, drainage, or persistent bleeding.  Images permanently stored and available for review in PACS.  Impression: Technically successful ultrasound guided injection.  Procedure: Real-time Ultrasound Guided injection of the left subacromial bursa Device: Samsung HS60  Verbal informed consent obtained.  Time-out conducted.  Noted no overlying erythema, induration, or other signs of local infection.  Skin prepped in a sterile fashion.  Local anesthesia: Topical Ethyl chloride.  With sterile technique and under real time ultrasound guidance:  Intact supraspinatus, 1 cc Kenalog 40, 1 cc lidocaine, 1 cc bupivacaine injected easily Completed without difficulty  Advised to call if fevers/chills, erythema, induration, drainage, or persistent bleeding.  Images permanently stored and available for review in PACS.  Impression: Technically successful ultrasound guided injection.  Independent interpretation of notes and tests performed by another provider:   None.  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 Benjamin Stain,  M.D., ABFM., CAQSM. Primary Care and Sports Medicine [place] [facility]  Adjunct Instructor of Family Medicine  [employer]

## 2020-03-28 NOTE — Assessment & Plan Note (Signed)
Repeat injection, last injected 8 months ago,

## 2020-03-28 NOTE — Assessment & Plan Note (Signed)
Repeat right first CMC injection today, last injected approximately 8 months ago

## 2020-04-04 ENCOUNTER — Encounter: Payer: Self-pay | Admitting: Sports Medicine

## 2020-04-04 LAB — HM DIABETES EYE EXAM

## 2020-04-19 ENCOUNTER — Other Ambulatory Visit: Payer: Self-pay | Admitting: Sports Medicine

## 2020-04-19 DIAGNOSIS — J449 Chronic obstructive pulmonary disease, unspecified: Secondary | ICD-10-CM

## 2020-05-17 ENCOUNTER — Ambulatory Visit (INDEPENDENT_AMBULATORY_CARE_PROVIDER_SITE_OTHER): Payer: BC Managed Care – PPO

## 2020-05-17 ENCOUNTER — Other Ambulatory Visit: Payer: Self-pay

## 2020-05-17 ENCOUNTER — Ambulatory Visit (INDEPENDENT_AMBULATORY_CARE_PROVIDER_SITE_OTHER): Payer: BC Managed Care – PPO | Admitting: Sports Medicine

## 2020-05-17 DIAGNOSIS — M47816 Spondylosis without myelopathy or radiculopathy, lumbar region: Secondary | ICD-10-CM | POA: Diagnosis not present

## 2020-05-17 DIAGNOSIS — M19071 Primary osteoarthritis, right ankle and foot: Secondary | ICD-10-CM | POA: Diagnosis not present

## 2020-05-17 NOTE — Assessment & Plan Note (Signed)
First MTP pain today after a lot of yard work, known osteoarthritis, repeat injection today, last done November 2017. Return as needed, he does have a rigid carbon fiber plate in the shoe to decrease flex.

## 2020-05-17 NOTE — Assessment & Plan Note (Signed)
Joseph Jacobs returns, he is having recurrence of low back pain, facet ablation only provided a few weeks of relief, he recalls doing well with a left L4-L5 transforaminal epidural back in 2017, he has done greater than 6 weeks of conservative treatment, getting an updated x-ray, updated MRI today in anticipation of bilateral L4-L5 transforaminal epidurals.

## 2020-05-17 NOTE — Progress Notes (Signed)
    Procedures performed today:    Procedure: Real-time Ultrasound Guided injection of the right first MTP Device: Samsung HS60  Verbal informed consent obtained.  Time-out conducted.  Noted no overlying erythema, induration, or other signs of local infection.  Skin prepped in a sterile fashion.  Local anesthesia: Topical Ethyl chloride.  With sterile technique and under real time ultrasound guidance:  Noted arthritic joint, 1/2 cc lidocaine, 1/2 cc, 40 injected easily.   Completed without difficulty  Advised to call if fevers/chills, erythema, induration, drainage, or persistent bleeding.  Images permanently stored and available for review in PACS.  Impression: Technically successful ultrasound guided injection.  Independent interpretation of notes and tests performed by another provider:   None.  Brief History, Exam, Impression, and Recommendations:    Primary osteoarthritis of right foot First MTP pain today after a lot of yard work, known osteoarthritis, repeat injection today, last done November 2017. Return as needed, he does have a rigid carbon fiber plate in the shoe to decrease flex.  Lumbar spondylosis Joseph Jacobs returns, he is having recurrence of low back pain, facet ablation only provided a few weeks of relief, he recalls doing well with a left L4-L5 transforaminal epidural back in 2017, he has done greater than 6 weeks of conservative treatment, getting an updated x-ray, updated MRI today in anticipation of bilateral L4-L5 transforaminal epidurals.     ___________________________________________ Joseph Jacobs. Benjamin Stain, M.D., ABFM., CAQSM. Primary Care and Sports Medicine [place] [facility]  Adjunct Instructor of Family Medicine  [employer]

## 2020-05-24 ENCOUNTER — Ambulatory Visit
Admission: RE | Admit: 2020-05-24 | Discharge: 2020-05-24 | Disposition: A | Discharge status: Home / Self Care | Payer: BC Managed Care – PPO | Source: Ambulatory Visit | Attending: Sports Medicine | Admitting: Sports Medicine

## 2020-05-24 ENCOUNTER — Other Ambulatory Visit: Payer: Self-pay

## 2020-05-24 DIAGNOSIS — M47816 Spondylosis without myelopathy or radiculopathy, lumbar region: Secondary | ICD-10-CM

## 2020-05-24 MED ORDER — METHYLPREDNISOLONE ACETATE 40 MG/ML INJ SUSP (RADIOLOG
80.0000 mg | Freq: Once | INTRAMUSCULAR | Status: AC
  Administered 2020-05-24: 80 mg via EPIDURAL

## 2020-05-24 MED ORDER — IOPAMIDOL (ISOVUE-M 200) INJECTION 41%
1.0000 mL | Freq: Once | INTRAMUSCULAR | Status: AC
  Administered 2020-05-24: 1 mL via EPIDURAL

## 2020-05-24 NOTE — Discharge Instructions (Signed)

## 2020-06-05 ENCOUNTER — Other Ambulatory Visit: Payer: Self-pay | Admitting: Sports Medicine

## 2020-06-05 DIAGNOSIS — R072 Precordial pain: Secondary | ICD-10-CM

## 2020-06-05 DIAGNOSIS — I2510 Atherosclerotic heart disease of native coronary artery without angina pectoris: Secondary | ICD-10-CM

## 2020-06-05 DIAGNOSIS — G43809 Other migraine, not intractable, without status migrainosus: Secondary | ICD-10-CM

## 2020-06-05 DIAGNOSIS — I2584 Coronary atherosclerosis due to calcified coronary lesion: Secondary | ICD-10-CM

## 2020-06-10 ENCOUNTER — Other Ambulatory Visit: Payer: Self-pay | Admitting: Sports Medicine

## 2020-06-22 ENCOUNTER — Other Ambulatory Visit: Payer: Self-pay | Admitting: Sports Medicine

## 2020-06-22 DIAGNOSIS — M5030 Other cervical disc degeneration, unspecified cervical region: Secondary | ICD-10-CM

## 2020-09-04 ENCOUNTER — Other Ambulatory Visit: Payer: Self-pay | Admitting: Sports Medicine

## 2020-09-04 DIAGNOSIS — G43809 Other migraine, not intractable, without status migrainosus: Secondary | ICD-10-CM

## 2020-09-04 DIAGNOSIS — R072 Precordial pain: Secondary | ICD-10-CM

## 2020-09-04 DIAGNOSIS — I2510 Atherosclerotic heart disease of native coronary artery without angina pectoris: Secondary | ICD-10-CM

## 2020-09-04 DIAGNOSIS — I2584 Coronary atherosclerosis due to calcified coronary lesion: Secondary | ICD-10-CM

## 2020-09-05 ENCOUNTER — Other Ambulatory Visit: Payer: Self-pay | Admitting: Sports Medicine

## 2020-09-05 DIAGNOSIS — J449 Chronic obstructive pulmonary disease, unspecified: Secondary | ICD-10-CM

## 2020-09-14 ENCOUNTER — Other Ambulatory Visit: Payer: Self-pay | Admitting: Sports Medicine

## 2020-10-25 ENCOUNTER — Other Ambulatory Visit: Payer: Self-pay | Admitting: Sports Medicine

## 2020-10-25 DIAGNOSIS — E119 Type 2 diabetes mellitus without complications: Secondary | ICD-10-CM

## 2020-10-25 NOTE — Telephone Encounter (Signed)
Left patient a VM to call back to get this appt with labs scheduled for further refills on med. AM

## 2020-10-27 ENCOUNTER — Ambulatory Visit (INDEPENDENT_AMBULATORY_CARE_PROVIDER_SITE_OTHER): Payer: BC Managed Care – PPO | Admitting: Sports Medicine

## 2020-10-27 ENCOUNTER — Other Ambulatory Visit: Payer: Self-pay

## 2020-10-27 ENCOUNTER — Encounter: Payer: Self-pay | Admitting: Sports Medicine

## 2020-10-27 VITALS — BP 116/72 | HR 80 | Ht 71.0 in | Wt 193.0 lb

## 2020-10-27 DIAGNOSIS — Z8042 Family history of malignant neoplasm of prostate: Secondary | ICD-10-CM

## 2020-10-27 DIAGNOSIS — Z0000 Encounter for general adult medical examination without abnormal findings: Secondary | ICD-10-CM | POA: Diagnosis not present

## 2020-10-27 DIAGNOSIS — E119 Type 2 diabetes mellitus without complications: Secondary | ICD-10-CM

## 2020-10-27 NOTE — Progress Notes (Signed)
    Procedures performed today:    None.  Independent interpretation of notes and tests performed by another provider:   None.  Brief History, Exam, Impression, and Recommendations:    Controlled type 2 diabetes mellitus without complication, without long-term current use of insulin (HCC) Rechecking routine labs.  Annual physical exam Declines flu and Shingrix. Colonoscopy was 5 years ago.  I spent 30 minutes of total time managing this patient today, this includes chart review, face to face, and non-face to face time.  ___________________________________________ [name]. Benjamin Stain, M.D., ABFM., CAQSM. Primary Care and Sports Medicine [place] [facility]  Adjunct Instructor of Family Medicine  [employer]

## 2020-10-27 NOTE — Assessment & Plan Note (Signed)
Rechecking routine labs. 

## 2020-10-27 NOTE — Assessment & Plan Note (Signed)
Declines flu and Shingrix. Colonoscopy was 5 years ago.

## 2020-10-28 LAB — LIPID PANEL
Cholesterol: 146 mg/dL (ref <200)
HDL: 46 mg/dL (ref >=40)
LDL Cholesterol (Calc): 75 mg/dL (calc)
Non-HDL Cholesterol (Calc): 100 mg/dL (calc) (ref <130)
Total CHOL/HDL Ratio: 3.2 (calc) (ref <5.0)
Triglycerides: 150 mg/dL — ABNORMAL HIGH (ref <150)

## 2020-10-28 LAB — COMPREHENSIVE METABOLIC PANEL
AG Ratio: 2 (calc) (ref 1.0–2.5)
ALT: 26 U/L (ref 9–46)
AST: 27 U/L (ref 10–35)
Albumin: 4.5 g/dL (ref 3.6–5.1)
Alkaline phosphatase (APISO): 85 U/L (ref 35–144)
BUN: 18 mg/dL (ref 7–25)
CO2: 28 mmol/L (ref 20–32)
Calcium: 9.6 mg/dL (ref 8.6–10.3)
Chloride: 104 mmol/L (ref 98–110)
Creat: 0.86 mg/dL (ref 0.70–1.35)
Globulin: 2.2 g/dL (calc) (ref 1.9–3.7)
Glucose, Bld: 114 mg/dL — ABNORMAL HIGH (ref 65–99)
Potassium: 4.4 mmol/L (ref 3.5–5.3)
Sodium: 141 mmol/L (ref 135–146)
Total Bilirubin: 0.6 mg/dL (ref 0.2–1.2)
Total Protein: 6.7 g/dL (ref 6.1–8.1)

## 2020-10-28 LAB — CBC
HCT: 44.6 % (ref 38.5–50.0)
Hemoglobin: 15.4 g/dL (ref 13.2–17.1)
MCH: 33.8 pg — ABNORMAL HIGH (ref 27.0–33.0)
MCHC: 34.5 g/dL (ref 32.0–36.0)
MCV: 97.8 fL (ref 80.0–100.0)
MPV: 11.5 fL (ref 7.5–12.5)
Platelets: 285 10*3/uL (ref 140–400)
RBC: 4.56 10*6/uL (ref 4.20–5.80)
RDW: 12.7 % (ref 11.0–15.0)
WBC: 5.5 10*3/uL (ref 3.8–10.8)

## 2020-10-28 LAB — MICROALBUMIN / CREATININE URINE RATIO
Creatinine, Urine: 149 mg/dL (ref 20–320)
Microalb, Ur: <0.2 mg/dL

## 2020-10-28 LAB — HEMOGLOBIN A1C
Hgb A1c MFr Bld: 5.8 % of total Hgb — ABNORMAL HIGH (ref <5.7)
Mean Plasma Glucose: 120 mg/dL
eAG (mmol/L): 6.6 mmol/L

## 2020-10-28 LAB — PSA, TOTAL AND FREE
PSA, % Free: 36 % (calc) (ref >25)
PSA, Free: 0.4 ng/mL
PSA, Total: 1.1 ng/mL (ref <=4.0)

## 2020-10-28 LAB — TSH: TSH: 1.36 mIU/L (ref 0.40–4.50)

## 2020-10-31 ENCOUNTER — Encounter (INDEPENDENT_AMBULATORY_CARE_PROVIDER_SITE_OTHER): Payer: BC Managed Care – PPO

## 2020-10-31 DIAGNOSIS — E119 Type 2 diabetes mellitus without complications: Secondary | ICD-10-CM

## 2020-11-01 MED ORDER — OZEMPIC (0.25 OR 0.5 MG/DOSE) 2 MG/1.5ML ~~LOC~~ SOPN: 0.5000 mg | PEN_INJECTOR | SUBCUTANEOUS | 3 refills | Status: DC

## 2020-11-01 NOTE — Telephone Encounter (Signed)
I spent 5 total minutes of online digital evaluation and management services. 

## 2020-11-11 ENCOUNTER — Encounter (INDEPENDENT_AMBULATORY_CARE_PROVIDER_SITE_OTHER): Payer: BC Managed Care – PPO

## 2020-11-11 DIAGNOSIS — J449 Chronic obstructive pulmonary disease, unspecified: Secondary | ICD-10-CM | POA: Diagnosis not present

## 2020-11-11 MED ORDER — ALBUTEROL SULFATE (2.5 MG/3ML) 0.083% IN NEBU: 2.5000 mg | INHALATION_SOLUTION | Freq: Four times a day (QID) | RESPIRATORY_TRACT | 12 refills | Status: AC | PRN

## 2020-11-11 NOTE — Telephone Encounter (Signed)
I spent 5 total minutes of online digital evaluation and management services. 

## 2020-11-14 ENCOUNTER — Other Ambulatory Visit: Payer: Self-pay | Admitting: Sports Medicine

## 2020-11-25 ENCOUNTER — Other Ambulatory Visit: Payer: Self-pay | Admitting: Sports Medicine

## 2020-11-25 DIAGNOSIS — M1811 Unilateral primary osteoarthritis of first carpometacarpal joint, right hand: Secondary | ICD-10-CM

## 2020-11-29 ENCOUNTER — Ambulatory Visit (INDEPENDENT_AMBULATORY_CARE_PROVIDER_SITE_OTHER): Payer: BC Managed Care – PPO

## 2020-11-29 ENCOUNTER — Ambulatory Visit: Payer: BC Managed Care – PPO | Admitting: Sports Medicine

## 2020-11-29 ENCOUNTER — Other Ambulatory Visit: Payer: Self-pay

## 2020-11-29 ENCOUNTER — Other Ambulatory Visit: Payer: Self-pay | Admitting: Sports Medicine

## 2020-11-29 DIAGNOSIS — R0602 Shortness of breath: Secondary | ICD-10-CM

## 2020-11-29 DIAGNOSIS — M1811 Unilateral primary osteoarthritis of first carpometacarpal joint, right hand: Secondary | ICD-10-CM

## 2020-11-29 DIAGNOSIS — R002 Palpitations: Secondary | ICD-10-CM

## 2020-11-29 NOTE — Progress Notes (Unsigned)
Enrolled patient for a 7 day Zio XT monitor to be mailed to patients home.  

## 2020-11-29 NOTE — Progress Notes (Addendum)
    Procedures performed today:    Procedure: Real-time Ultrasound Guided injection of the right first [facility] Device: Samsung HS60  Verbal informed consent obtained.  Time-out conducted.  Noted no overlying erythema, induration, or other signs of local infection.  Skin prepped in a sterile fashion.  Local anesthesia: Topical Ethyl chloride.  With sterile technique and under real time ultrasound guidance: Noted arthritic joint, 1/2 cc lidocaine, 1/2 cc kenalog 40 injected easily. Advised to call if fevers/chills, erythema, induration, drainage, or persistent bleeding.  Images permanently stored and available for review in PACS.  Impression: Technically successful ultrasound guided injection.  Independent interpretation of notes and tests performed by another provider:   ECG performed and interpreted by me, regular rate, regular rhythm, left axis deviation, occasional PVCs.  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  Chronic process with exacerbation and pharmacologic intervention.  Shortness of breath Getting occasional episodes of shortness of breath, he also is aware of his heartbeat. ECG today. We will check some labs, electrolytes, TSH, D-dimer, adding a chest x-ray, often times these symptoms are a result of PACs and PVCs so we will also add a Zio patch monitor. (Holter 4 years ago did show PACs and PVCs, Echo 4 years ago with preserved EF and diastolic dysfunction.  Valves okay.) Return to see me after 7 to 14 days of using the Zio patch. If we see persistence of PACs and PVCs without A. fib we will probably increase his metoprolol dose.  I spent 40 minutes of total time managing this patient today, this includes chart review, face to face, and non-face to face time.  Specifically discussed cardiac  physiology and electrical disturbances.  ___________________________________________ [name]. Benjamin Stain, M.D., ABFM., CAQSM. Primary Care and Sports Medicine [place] [facility]  Adjunct Instructor of Family Medicine  [employer]

## 2020-11-29 NOTE — Assessment & Plan Note (Addendum)
Getting occasional episodes of shortness of breath, he also is aware of his heartbeat. ECG today. We will check some labs, electrolytes, TSH, D-dimer, adding a chest x-ray, often times these symptoms are a result of PACs and PVCs so we will also add a Zio patch monitor. (Holter 4 years ago did show PACs and PVCs, Echo 4 years ago with preserved EF and diastolic dysfunction.  Valves okay.) Return to see me after 7 to 14 days of using the Zio patch. If we see persistence of PACs and PVCs without A. fib we will probably increase his metoprolol dose.

## 2020-11-29 NOTE — Addendum Note (Signed)
Addended by: Monica Becton on: 11/29/2020 11:50 AM   Modules accepted: Level of Service

## 2020-11-29 NOTE — Assessment & Plan Note (Signed)
Increasing pain, repeat right first CMC injection today, last injected in February of this year.  Chronic process with exacerbation and pharmacologic intervention.

## 2020-11-30 LAB — CBC
HCT: 43 % (ref 38.5–50.0)
Hemoglobin: 14.7 g/dL (ref 13.2–17.1)
MCH: 33.2 pg — ABNORMAL HIGH (ref 27.0–33.0)
MCHC: 34.2 g/dL (ref 32.0–36.0)
MCV: 97.1 fL (ref 80.0–100.0)
MPV: 10.6 fL (ref 7.5–12.5)
Platelets: 307 10*3/uL (ref 140–400)
RBC: 4.43 10*6/uL (ref 4.20–5.80)
RDW: 13 % (ref 11.0–15.0)
WBC: 5.6 10*3/uL (ref 3.8–10.8)

## 2020-11-30 LAB — COMPREHENSIVE METABOLIC PANEL
AG Ratio: 2.1 (calc) (ref 1.0–2.5)
ALT: 27 U/L (ref 9–46)
AST: 26 U/L (ref 10–35)
Albumin: 4.4 g/dL (ref 3.6–5.1)
Alkaline phosphatase (APISO): 68 U/L (ref 35–144)
BUN: 14 mg/dL (ref 7–25)
CO2: 27 mmol/L (ref 20–32)
Calcium: 9.5 mg/dL (ref 8.6–10.3)
Chloride: 104 mmol/L (ref 98–110)
Creat: 0.83 mg/dL (ref 0.70–1.35)
Globulin: 2.1 g/dL (calc) (ref 1.9–3.7)
Glucose, Bld: 112 mg/dL — ABNORMAL HIGH (ref 65–99)
Potassium: 4.2 mmol/L (ref 3.5–5.3)
Sodium: 141 mmol/L (ref 135–146)
Total Bilirubin: 0.6 mg/dL (ref 0.2–1.2)
Total Protein: 6.5 g/dL (ref 6.1–8.1)

## 2020-11-30 LAB — BRAIN NATRIURETIC PEPTIDE: Brain Natriuretic Peptide: 9 pg/mL (ref <100)

## 2020-11-30 LAB — D-DIMER, QUANTITATIVE: D-Dimer, Quant: 0.45 mcg/mL FEU (ref <0.50)

## 2020-11-30 LAB — TSH: TSH: 1.57 mIU/L (ref 0.40–4.50)

## 2020-11-30 NOTE — Addendum Note (Signed)
Addended by: Chalmers Cater on: 11/30/2020 10:53 AM   Modules accepted: Orders

## 2020-12-02 DIAGNOSIS — R002 Palpitations: Secondary | ICD-10-CM | POA: Diagnosis not present

## 2020-12-02 DIAGNOSIS — R0602 Shortness of breath: Secondary | ICD-10-CM | POA: Diagnosis not present

## 2020-12-05 ENCOUNTER — Other Ambulatory Visit: Payer: Self-pay | Admitting: Sports Medicine

## 2020-12-05 DIAGNOSIS — G43809 Other migraine, not intractable, without status migrainosus: Secondary | ICD-10-CM

## 2020-12-05 DIAGNOSIS — R072 Precordial pain: Secondary | ICD-10-CM

## 2020-12-05 DIAGNOSIS — I2510 Atherosclerotic heart disease of native coronary artery without angina pectoris: Secondary | ICD-10-CM

## 2020-12-14 ENCOUNTER — Other Ambulatory Visit: Payer: Self-pay | Admitting: Sports Medicine

## 2020-12-23 ENCOUNTER — Encounter: Payer: Self-pay | Admitting: Sports Medicine

## 2020-12-25 ENCOUNTER — Encounter: Payer: Self-pay | Admitting: Sports Medicine

## 2021-01-18 ENCOUNTER — Other Ambulatory Visit: Payer: Self-pay | Admitting: Sports Medicine

## 2021-01-18 DIAGNOSIS — E119 Type 2 diabetes mellitus without complications: Secondary | ICD-10-CM

## 2021-01-20 ENCOUNTER — Other Ambulatory Visit: Payer: Self-pay | Admitting: Sports Medicine

## 2021-01-20 DIAGNOSIS — R072 Precordial pain: Secondary | ICD-10-CM

## 2021-01-20 DIAGNOSIS — I2584 Coronary atherosclerosis due to calcified coronary lesion: Secondary | ICD-10-CM

## 2021-01-20 DIAGNOSIS — G43809 Other migraine, not intractable, without status migrainosus: Secondary | ICD-10-CM

## 2021-01-22 ENCOUNTER — Encounter: Payer: Self-pay | Admitting: Sports Medicine

## 2021-02-07 ENCOUNTER — Encounter: Payer: Self-pay | Admitting: Sports Medicine

## 2021-02-07 MED ORDER — ONETOUCH VERIO VI STRP: ORAL_STRIP | 12 refills | Status: DC

## 2021-02-14 ENCOUNTER — Other Ambulatory Visit: Payer: Self-pay | Admitting: Sports Medicine

## 2021-02-14 DIAGNOSIS — J449 Chronic obstructive pulmonary disease, unspecified: Secondary | ICD-10-CM

## 2021-02-15 ENCOUNTER — Telehealth: Payer: Self-pay

## 2021-02-15 NOTE — Telephone Encounter (Signed)
Pt is currently taking Ozempic for DM and states that when he went to the pharmacy to pick up his Rx he was only able to get a #30 day supply due to the shortage and it cost him $47.00. Pt is wanting to know if there is anything comparable to Ozempic that will not cost as much.

## 2021-02-15 NOTE — Telephone Encounter (Signed)
That is actually an excellent price for GLP-1's, I would not recommend any changes.

## 2021-02-16 NOTE — Telephone Encounter (Signed)
Patient aware of Dr. Mcneil Sober response via voicemail. Instructed patient to call back if there are any questions or concerns.

## 2021-02-28 DIAGNOSIS — H43812 Vitreous degeneration, left eye: Secondary | ICD-10-CM | POA: Diagnosis not present

## 2021-03-07 ENCOUNTER — Other Ambulatory Visit: Payer: Self-pay | Admitting: Sports Medicine

## 2021-04-04 ENCOUNTER — Other Ambulatory Visit: Payer: Self-pay | Admitting: Sports Medicine

## 2021-04-04 DIAGNOSIS — M1712 Unilateral primary osteoarthritis, left knee: Secondary | ICD-10-CM

## 2021-04-18 ENCOUNTER — Other Ambulatory Visit: Payer: Self-pay | Admitting: Sports Medicine

## 2021-04-18 DIAGNOSIS — J449 Chronic obstructive pulmonary disease, unspecified: Secondary | ICD-10-CM

## 2021-05-14 ENCOUNTER — Other Ambulatory Visit: Payer: Self-pay | Admitting: Sports Medicine

## 2021-05-14 DIAGNOSIS — G43809 Other migraine, not intractable, without status migrainosus: Secondary | ICD-10-CM

## 2021-05-14 DIAGNOSIS — I2510 Atherosclerotic heart disease of native coronary artery without angina pectoris: Secondary | ICD-10-CM

## 2021-05-14 DIAGNOSIS — R072 Precordial pain: Secondary | ICD-10-CM

## 2021-05-17 ENCOUNTER — Ambulatory Visit (INDEPENDENT_AMBULATORY_CARE_PROVIDER_SITE_OTHER): Payer: Medicare Other | Admitting: Sports Medicine

## 2021-05-17 ENCOUNTER — Ambulatory Visit: Payer: Self-pay

## 2021-05-17 ENCOUNTER — Ambulatory Visit (INDEPENDENT_AMBULATORY_CARE_PROVIDER_SITE_OTHER): Payer: Medicare Other

## 2021-05-17 DIAGNOSIS — M7741 Metatarsalgia, right foot: Secondary | ICD-10-CM | POA: Diagnosis not present

## 2021-05-17 DIAGNOSIS — M1811 Unilateral primary osteoarthritis of first carpometacarpal joint, right hand: Secondary | ICD-10-CM

## 2021-05-17 DIAGNOSIS — M222X2 Patellofemoral disorders, left knee: Secondary | ICD-10-CM | POA: Diagnosis not present

## 2021-05-17 DIAGNOSIS — M222X1 Patellofemoral disorders, right knee: Secondary | ICD-10-CM | POA: Diagnosis not present

## 2021-05-17 DIAGNOSIS — M47816 Spondylosis without myelopathy or radiculopathy, lumbar region: Secondary | ICD-10-CM

## 2021-05-17 DIAGNOSIS — M19071 Primary osteoarthritis, right ankle and foot: Secondary | ICD-10-CM | POA: Diagnosis not present

## 2021-05-17 MED ORDER — HYDROCODONE-ACETAMINOPHEN 10-325 MG PO TABS: 1.0000 | ORAL_TABLET | Freq: Four times a day (QID) | ORAL | 0 refills | Status: DC | PRN

## 2021-05-17 MED ORDER — DICLOFENAC SODIUM 75 MG PO TBEC
75.0000 mg | DELAYED_RELEASE_TABLET | Freq: Two times a day (BID) | ORAL | 3 refills | Status: DC
Start: 2021-05-17 — End: 2021-11-01

## 2021-05-17 MED ORDER — TRAMADOL HCL ER 100 MG PO TB24: 100.0000 mg | ORAL_TABLET | Freq: Every day | ORAL | 2 refills | Status: DC

## 2021-05-17 MED ORDER — DICLOFENAC SODIUM 3 % EX GEL: CUTANEOUS | 11 refills | Status: DC

## 2021-05-17 NOTE — Assessment & Plan Note (Signed)
Right first CMC injection today. ?

## 2021-05-17 NOTE — Assessment & Plan Note (Signed)
Right worse than left anterior knee pain, home conditioning given, will do injection in the future if needed. ?

## 2021-05-17 NOTE — Assessment & Plan Note (Signed)
Repeat lumbar epidural ?

## 2021-05-17 NOTE — Assessment & Plan Note (Signed)
Right first MTP injection today. ?

## 2021-05-17 NOTE — Progress Notes (Signed)
? ? ?  Procedures performed today:   ? ?Procedure: Real-time Ultrasound Guided injection of the right first [facility] ?Device: Samsung HS60  ?Verbal informed consent obtained.  ?Time-out conducted.  ?Noted no overlying erythema, induration, or other signs of local infection.  ?Skin prepped in a sterile fashion.  ?Local anesthesia: Topical Ethyl chloride.  ?With sterile technique and under real time ultrasound guidance: Arthritic joint noted, 1/2 cc lidocaine, 1/2 cc kenalog 40 injected easily. ?Completed without difficulty  ?Advised to call if fevers/chills, erythema, induration, drainage, or persistent bleeding.  ?Images permanently stored and available for review in PACS.  ?Impression: Technically successful ultrasound guided injection. ? ?Procedure: Real-time Ultrasound Guided injection of the right first metatarsophalangeal joint ?Device: Samsung HS60  ?Verbal informed consent obtained.  ?Time-out conducted.  ?Noted no overlying erythema, induration, or other signs of local infection.  ?Skin prepped in a sterile fashion.  ?Local anesthesia: Topical Ethyl chloride.  ?With sterile technique and under real time ultrasound guidance: Arthritic joint noted, 1/2 cc lidocaine, 1/2 cc kenalog 40 injected easily. ?Completed without difficulty  ?Advised to call if fevers/chills, erythema, induration, drainage, or persistent bleeding.  ?Images permanently stored and available for review in PACS.  ?Impression: Technically successful ultrasound guided injection. ? ?Independent interpretation of notes and tests performed by another provider:  ? ?None. ? ?Brief History, Exam, Impression, and Recommendations:   ? ?Bilateral patellofemoral syndrome ?Right worse than left anterior knee pain, home conditioning given, will do injection in the future if needed. ? 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 ? ?Primary osteoarthritis of right foot ?Right first MTP injection  today. ? ?Lumbar spondylosis ?Repeat lumbar epidural ? ?Chronic process with exacerbation and pharmacologic intervention ? ?___________________________________________ ?Gwen Her. Dianah Field, M.D., ABFM., CAQSM. ?Primary Care and Sports Medicine ?[place] ? ?Adjunct Instructor of Family Medicine  ?[employer] ?

## 2021-05-24 ENCOUNTER — Ambulatory Visit: Payer: BC Managed Care – PPO | Admitting: Sports Medicine

## 2021-05-31 ENCOUNTER — Ambulatory Visit
Admission: RE | Admit: 2021-05-31 | Discharge: 2021-05-31 | Disposition: A | Discharge status: Home / Self Care | Payer: Medicare Other | Source: Ambulatory Visit | Attending: Sports Medicine | Admitting: Sports Medicine

## 2021-05-31 ENCOUNTER — Other Ambulatory Visit: Payer: Self-pay | Admitting: Sports Medicine

## 2021-05-31 DIAGNOSIS — M47816 Spondylosis without myelopathy or radiculopathy, lumbar region: Secondary | ICD-10-CM

## 2021-05-31 DIAGNOSIS — M47817 Spondylosis without myelopathy or radiculopathy, lumbosacral region: Secondary | ICD-10-CM | POA: Diagnosis not present

## 2021-05-31 MED ORDER — IOPAMIDOL (ISOVUE-M 200) INJECTION 41%
1.0000 mL | Freq: Once | INTRAMUSCULAR | Status: AC
  Administered 2021-05-31: 1 mL via EPIDURAL

## 2021-05-31 MED ORDER — METHYLPREDNISOLONE ACETATE 40 MG/ML INJ SUSP (RADIOLOG
80.0000 mg | Freq: Once | INTRAMUSCULAR | Status: AC
Start: 2021-05-31 — End: 2021-05-31
  Administered 2021-05-31: 80 mg via EPIDURAL

## 2021-05-31 NOTE — Discharge Instructions (Signed)

## 2021-06-10 ENCOUNTER — Other Ambulatory Visit: Payer: Self-pay | Admitting: Sports Medicine

## 2021-06-12 ENCOUNTER — Encounter: Payer: Self-pay | Admitting: Sports Medicine

## 2021-06-22 ENCOUNTER — Other Ambulatory Visit: Payer: Self-pay | Admitting: Sports Medicine

## 2021-07-25 ENCOUNTER — Other Ambulatory Visit: Payer: Self-pay | Admitting: Sports Medicine

## 2021-07-25 DIAGNOSIS — E119 Type 2 diabetes mellitus without complications: Secondary | ICD-10-CM

## 2021-08-12 ENCOUNTER — Other Ambulatory Visit: Payer: Self-pay | Admitting: Sports Medicine

## 2021-08-12 DIAGNOSIS — G43809 Other migraine, not intractable, without status migrainosus: Secondary | ICD-10-CM

## 2021-08-12 DIAGNOSIS — R072 Precordial pain: Secondary | ICD-10-CM

## 2021-08-12 DIAGNOSIS — I2510 Atherosclerotic heart disease of native coronary artery without angina pectoris: Secondary | ICD-10-CM

## 2021-08-15 ENCOUNTER — Other Ambulatory Visit: Payer: Self-pay | Admitting: Sports Medicine

## 2021-09-16 ENCOUNTER — Other Ambulatory Visit: Payer: Self-pay | Admitting: Sports Medicine

## 2021-09-20 ENCOUNTER — Encounter: Payer: Self-pay | Admitting: General Practice

## 2021-10-04 ENCOUNTER — Encounter: Payer: Self-pay | Admitting: Sports Medicine

## 2021-10-04 ENCOUNTER — Ambulatory Visit (INDEPENDENT_AMBULATORY_CARE_PROVIDER_SITE_OTHER): Payer: Medicare Other | Admitting: Sports Medicine

## 2021-10-04 VITALS — BP 123/75 | HR 73 | Ht 71.0 in | Wt 196.0 lb

## 2021-10-04 DIAGNOSIS — L821 Other seborrheic keratosis: Secondary | ICD-10-CM | POA: Diagnosis not present

## 2021-10-04 DIAGNOSIS — E119 Type 2 diabetes mellitus without complications: Secondary | ICD-10-CM

## 2021-10-04 DIAGNOSIS — N139 Obstructive and reflux uropathy, unspecified: Secondary | ICD-10-CM

## 2021-10-04 DIAGNOSIS — Z0000 Encounter for general adult medical examination without abnormal findings: Secondary | ICD-10-CM

## 2021-10-04 NOTE — Progress Notes (Signed)
Subjective:   Joseph Jacobs is a 66 y.o. male who presents for a Welcome to Medicare exam.   Review of Systems: A comprehensive review of systems was negative    Objective:    Today's Vitals   10/04/21 1421  BP: 123/75  Pulse: 73  SpO2: 96%  Weight: 196 lb (88.9 kg)  Height: 5\' 11"  (1.803 m)   Body mass index is 27.34 kg/m.  Medications Outpatient Encounter Medications as of 10/04/2021  Medication Sig   acetaminophen (TYLENOL) 325 MG tablet Take 650 mg by mouth every 6 (six) hours as needed.   albuterol (PROVENTIL) (2.5 MG/3ML) 0.083% nebulizer solution Take 3 mLs (2.5 mg total) by nebulization every 6 (six) hours as needed for wheezing or shortness of breath.   albuterol (VENTOLIN HFA) 108 (90 Base) MCG/ACT inhaler INHALE TWO PUFFS BY MOUTH EVERY 6 HOURS AS NEEDED FOR WHEEZING OR SHORTNESS OF BREATH   AMBULATORY NON FORMULARY MEDICATION Single glucometer with lancets, test strips   aspirin EC 81 MG tablet Take 1 tablet (81 mg total) by mouth daily. (Patient taking differently: Take 81 mg by mouth every evening.)   atorvastatin (LIPITOR) 80 MG tablet TAKE ONE TABLET BY MOUTH EVERY NIGHT AT BEDTIME   cyclobenzaprine (FLEXERIL) 10 MG tablet One half tab PO qHS, then increase gradually to one tab TID.   diclofenac (VOLTAREN) 75 MG EC tablet Take 1 tablet (75 mg total) by mouth 2 (two) times daily.   Diclofenac Sodium 3 % GEL APPLY ONE APPLICATION TOPICALLY IN THE MORNING AND AT BEDTIME   glucose blood (ONETOUCH VERIO) test strip Use as instructed, testing TID   HYDROcodone-acetaminophen (NORCO) 10-325 MG tablet Take 1 tablet by mouth every 6 (six) hours as needed.   metFORMIN (GLUCOPHAGE-XR) 500 MG 24 hr tablet TAKE ONE TABLET BY MOUTH EVERY MORNING   metoprolol succinate (TOPROL-XL) 25 MG 24 hr tablet TAKE ONE TABLET BY MOUTH DAILY   montelukast (SINGULAIR) 10 MG tablet TAKE ONE TABLET BY MOUTH EVERY NIGHT AT BEDTIME   nitroGLYCERIN (NITROSTAT) 0.4 MG SL tablet Place 1  tablet (0.4 mg total) under the tongue every 5 (five) minutes as needed for chest pain.   SUMAtriptan (IMITREX) 100 MG tablet TAKE ONE TABLET BY MOUTH AT ONSET OF HEADACHE; MAY REPEAT ONE TABLET IN 2 HOURS IF NEEDED.   SUMAtriptan-naproxen (TREXIMET) 85-500 MG tablet One-half to one tab PO at the first sign of migraine, may repeat x1 in 2 hours if headache still present.   traMADol (ULTRAM) 50 MG tablet Take 1-2 tablets (50-100 mg total) by mouth every 8 (eight) hours as needed for moderate pain. Maximum 6 tabs per day.   traMADol (ULTRAM-ER) 100 MG 24 hr tablet Take 1 tablet (100 mg total) by mouth daily.   [DISCONTINUED] doxycycline (VIBRAMYCIN) 100 MG capsule Take one cap PO Q12hr with food.   [DISCONTINUED] fluticasone (FLONASE) 50 MCG/ACT nasal spray One spray in each nostril twice a day, use left hand for right nostril, and right hand for left nostril.   [DISCONTINUED] Semaglutide,0.25 or 0.5MG /DOS, (OZEMPIC, 0.25 OR 0.5 MG/DOSE,) 2 MG/1.5ML SOPN Inject 0.5 mg into the skin once a week.   No facility-administered encounter medications on file as of 10/04/2021.     History: Past Medical History:  Diagnosis Date   Anginal pain (HCC)    Arthritis    "hands, knees, back, rightt foot" (10/11/2016)   Asthma    Chronic bronchitis (HCC)    "get it most years" (10/11/2016)  Chronic lower back pain    Coronary artery disease    High cholesterol    Lumbar degenerative disc disease 12/14/2013   Migraine    "daily to weekly" (10/11/2016)   Pneumonia    "several times; last time was in 03/2016" (10/11/2016)   Sleep apnea    Stress fracture of foot 12/14/2013   "right"   Past Surgical History:  Procedure Laterality Date   BILATERAL CARPAL TUNNEL RELEASE Bilateral    CORONARY ANGIOPLASTY WITH STENT PLACEMENT  10/11/2016   CORONARY STENT INTERVENTION N/A 10/11/2016   Procedure: CORONARY STENT INTERVENTION;  Surgeon: Runell Gess, MD;  Location: [facility] IN[facility];  Service: Cardiovascular;   Laterality: N/A;  prox RCA   KNEE ARTHROSCOPY Left 1978   KNEE CARTILAGE SURGERY Left    LEFT HEART CATH AND CORONARY ANGIOGRAPHY N/A 10/11/2016   Procedure: LEFT HEART CATH AND CORONARY ANGIOGRAPHY;  Surgeon: Runell Gess, MD;  Location: [facility] INVASIVE CV LAB;  Service: Cardiovascular;  Laterality: N/A;    Family History  Problem Relation Age of Onset   Brain cancer Father    Alcoholism Mother    Diabetes Mother        father    Lung cancer Mother    Cancer Other    Cancer - Prostate Brother    Social History   Occupational History   Occupation: [employer]  Tobacco Use   Smoking status: Former    Packs/day: 0.33    Years: 44.00    Total pack years: 14.52    Types: Cigarettes    Quit date: 09/29/2016    Years since quitting: 5.0   Smokeless tobacco: Never  Vaping Use   Vaping Use: Never used  Substance and Sexual Activity   Alcohol use: Yes    Alcohol/week: 6.0 standard drinks of alcohol    Types: 3 Glasses of wine, 3 Standard drinks or equivalent per week   Drug use: No   Sexual activity: Not Currently    Partners: Female   Tobacco Counseling Counseling given: Not Answered   Immunizations and Health Maintenance Immunization History  Administered Date(s) Administered   Influenza Split 11/22/2009, 12/16/2012   Influenza Whole 11/23/2011   Influenza-Unspecified 11/30/2015   Pneumococcal Polysaccharide-23 03/30/2018, 04/11/2018   Tdap 12/31/2016, 04/13/2018   Health Maintenance Due  Topic Date Due   COVID-19 Vaccine (1) Never done   Pneumonia Vaccine 36+ Years old (2 - PCV) 04/01/2021   OPHTHALMOLOGY EXAM  04/04/2021   HEMOGLOBIN A1C  04/26/2021   Diabetic kidney evaluation - Urine ACR  10/27/2021    Activities of Daily Living Able to perform all ADLs and IADLs.  Physical Exam   General: Well Developed, well nourished, and in no acute distress.  Neuro: Alert and oriented x3, extra-ocular muscles intact, sensation grossly intact. Cranial nerves II through XII  are intact, motor, sensory, and coordinative functions are all intact. HEENT: Normocephalic, atraumatic, pupils equal round reactive to light, neck supple, no masses, no lymphadenopathy, thyroid nonpalpable. Oropharynx, nasopharynx, external ear canals are unremarkable. Skin: Warm and dry, no rashes noted.  Scattered seborrheic keratoses on the back. Cardiac: Regular rate and rhythm, no murmurs rubs or gallops.  Respiratory: Clear to auscultation bilaterally. Not using accessory muscles, speaking in full sentences.  Abdominal: Soft, nontender, nondistended, positive bowel sounds, no masses, no organomegaly.  Musculoskeletal: Shoulder, elbow, wrist, hip, knee, ankle stable, and with full range of motion.  Twelve-lead ECG obtained and personally reviewed, normal sinus rhythm, left axis deviation noted,  otherwise normal, rate of 66 bpm.  No PR or other ST changes.  Advanced Directives: Full code, however would not want additional mechanical ventilation in the case of poor neurologic prognosis.     Assessment:    This is a routine wellness  examination for this patient . Yes  Vision/Hearing screen Vision Screening   Right eye Left eye Both eyes  Without correction     With correction 20/25 20/25 20/25     Dietary issues and exercise activities discussed:      Goals   None     Depression Screen    10/04/2021    2:22 PM 10/02/2017   11:28 AM 01/29/2017    4:50 PM 10/16/2016   10:12 AM  PHQ 2/9 Scores  PHQ - 2 Score 0 2 3 2   PHQ- 9 Score  3  8     Fall Risk    10/04/2021    2:22 PM  Fall Risk   Falls in the past year? 0  Number falls in past yr: 0  Injury with Fall? 0    Cognitive Function    01/03/2017    1:34 PM  MMSE - Mini Mental State Exam  Orientation to time 5  Orientation to Place 5  Registration 3  Attention/ Calculation 5  Recall 3  Language- name 2 objects 2  Language- repeat 1  Language- follow 3 step command 3  Language- read & follow direction 1  Write a  sentence 1  Copy design 1  Total score 30        10/04/2021    2:22 PM  6CIT Screen  What Year? 0 points  What month? 0 points  What time? 0 points  Count back from 20 0 points  Months in reverse 2 points  Repeat phrase 0 points  Total Score 2 points    Patient Care Team: 14/06/2016, MD as PCP - General (Sports Medicine) 12/04/2021, MD as PCP - Cardiology (Cardiology)     Plan:    Annual physical exam Welcome to Medicare exam as above. Colonoscopy was in 2017, repeat in 2027. Declines shingles and flu. We will revisit his next pneumococcal vaccination at the follow-up visit.  Seborrheic keratosis Noted a couple of large seborrheic keratoses on the back, patient can return for cryotherapy at his leisure.   I have personally reviewed and noted the following in the patient's chart:   Medical and social history Use of alcohol, tobacco or illicit drugs  Current medications and supplements Functional ability and status Nutritional status Physical activity Advanced directives List of other physicians Hospitalizations, surgeries, and ER visits in previous 12 months Vitals Screenings to include cognitive, depression, and falls Referrals and appointments  In addition, I have reviewed and discussed with patient certain preventive protocols, quality metrics, and best practice recommendations. A written personalized care plan for preventive services as well as general preventive health recommendations were provided to patient.    ___________________________________________ 2018. 2028, M.D., ABFM., CAQSM., AME. Primary Care and Sports Medicine [facility]  Adjunct Instructor of Family Medicine  [employer]  [place]

## 2021-10-04 NOTE — Assessment & Plan Note (Signed)
Noted a couple of large seborrheic keratoses on the back, patient can return for cryotherapy at his leisure.

## 2021-10-04 NOTE — Assessment & Plan Note (Addendum)
Welcome to [facility] exam as above. Colonoscopy was in 2017, repeat in 2027. Declines shingles and flu. We will revisit his next pneumococcal vaccination at the follow-up visit.

## 2021-10-07 ENCOUNTER — Other Ambulatory Visit: Payer: Self-pay | Admitting: Sports Medicine

## 2021-10-09 ENCOUNTER — Other Ambulatory Visit: Payer: Self-pay | Admitting: Internal Medicine

## 2021-10-09 DIAGNOSIS — R079 Chest pain, unspecified: Secondary | ICD-10-CM

## 2021-10-21 ENCOUNTER — Other Ambulatory Visit: Payer: Self-pay | Admitting: Sports Medicine

## 2021-10-21 DIAGNOSIS — E119 Type 2 diabetes mellitus without complications: Secondary | ICD-10-CM

## 2021-11-01 ENCOUNTER — Other Ambulatory Visit: Payer: Self-pay

## 2021-11-01 DIAGNOSIS — R072 Precordial pain: Secondary | ICD-10-CM

## 2021-11-01 DIAGNOSIS — R079 Chest pain, unspecified: Secondary | ICD-10-CM

## 2021-11-01 DIAGNOSIS — G43809 Other migraine, not intractable, without status migrainosus: Secondary | ICD-10-CM

## 2021-11-01 DIAGNOSIS — M1712 Unilateral primary osteoarthritis, left knee: Secondary | ICD-10-CM

## 2021-11-01 DIAGNOSIS — M1811 Unilateral primary osteoarthritis of first carpometacarpal joint, right hand: Secondary | ICD-10-CM

## 2021-11-01 DIAGNOSIS — E119 Type 2 diabetes mellitus without complications: Secondary | ICD-10-CM

## 2021-11-01 DIAGNOSIS — I2510 Atherosclerotic heart disease of native coronary artery without angina pectoris: Secondary | ICD-10-CM

## 2021-11-01 DIAGNOSIS — J449 Chronic obstructive pulmonary disease, unspecified: Secondary | ICD-10-CM

## 2021-11-01 MED ORDER — TRAMADOL HCL 50 MG PO TABS: 50.0000 mg | ORAL_TABLET | Freq: Three times a day (TID) | ORAL | 0 refills | Status: DC | PRN

## 2021-11-01 MED ORDER — ALBUTEROL SULFATE HFA 108 (90 BASE) MCG/ACT IN AERS: INHALATION_SPRAY | RESPIRATORY_TRACT | 11 refills | Status: DC

## 2021-11-01 MED ORDER — DICLOFENAC SODIUM 3 % EX GEL: CUTANEOUS | 11 refills | Status: AC

## 2021-11-01 MED ORDER — SUMATRIPTAN SUCCINATE 100 MG PO TABS: ORAL_TABLET | ORAL | 11 refills | Status: DC

## 2021-11-01 MED ORDER — METOPROLOL SUCCINATE ER 25 MG PO TB24: 25.0000 mg | ORAL_TABLET | Freq: Every day | ORAL | 3 refills | Status: DC

## 2021-11-01 MED ORDER — NITROGLYCERIN 0.4 MG SL SUBL: SUBLINGUAL_TABLET | SUBLINGUAL | 3 refills | Status: DC

## 2021-11-01 MED ORDER — DICLOFENAC SODIUM 75 MG PO TBEC: 75.0000 mg | DELAYED_RELEASE_TABLET | Freq: Two times a day (BID) | ORAL | 3 refills | Status: DC

## 2021-11-01 MED ORDER — ATORVASTATIN CALCIUM 80 MG PO TABS: 80.0000 mg | ORAL_TABLET | Freq: Every day | ORAL | 3 refills | Status: DC

## 2021-11-01 MED ORDER — METFORMIN HCL ER 500 MG PO TB24: 500.0000 mg | ORAL_TABLET | Freq: Every morning | ORAL | 3 refills | Status: DC

## 2021-11-01 NOTE — Telephone Encounter (Signed)
Please make sure these are all 90 day supply and send to [facility].

## 2021-11-06 ENCOUNTER — Other Ambulatory Visit: Payer: Self-pay

## 2021-11-06 MED ORDER — MONTELUKAST SODIUM 10 MG PO TABS: 10.0000 mg | ORAL_TABLET | Freq: Every day | ORAL | 3 refills | Status: DC

## 2021-11-11 ENCOUNTER — Other Ambulatory Visit: Payer: Self-pay | Admitting: Sports Medicine

## 2021-11-11 DIAGNOSIS — I2510 Atherosclerotic heart disease of native coronary artery without angina pectoris: Secondary | ICD-10-CM

## 2021-11-11 DIAGNOSIS — G43809 Other migraine, not intractable, without status migrainosus: Secondary | ICD-10-CM

## 2021-11-11 DIAGNOSIS — R072 Precordial pain: Secondary | ICD-10-CM

## 2021-12-12 ENCOUNTER — Other Ambulatory Visit: Payer: Self-pay | Admitting: Sports Medicine

## 2021-12-17 ENCOUNTER — Other Ambulatory Visit: Payer: Self-pay | Admitting: Sports Medicine

## 2022-01-05 ENCOUNTER — Ambulatory Visit: Payer: Medicare Other | Admitting: Sports Medicine

## 2022-01-16 ENCOUNTER — Ambulatory Visit (INDEPENDENT_AMBULATORY_CARE_PROVIDER_SITE_OTHER): Payer: Medicare Other

## 2022-01-16 ENCOUNTER — Ambulatory Visit (INDEPENDENT_AMBULATORY_CARE_PROVIDER_SITE_OTHER): Payer: Medicare Other | Admitting: Sports Medicine

## 2022-01-16 ENCOUNTER — Ambulatory Visit: Payer: Self-pay

## 2022-01-16 DIAGNOSIS — M19071 Primary osteoarthritis, right ankle and foot: Secondary | ICD-10-CM

## 2022-01-16 DIAGNOSIS — M1811 Unilateral primary osteoarthritis of first carpometacarpal joint, right hand: Secondary | ICD-10-CM | POA: Diagnosis not present

## 2022-01-16 DIAGNOSIS — N139 Obstructive and reflux uropathy, unspecified: Secondary | ICD-10-CM | POA: Diagnosis not present

## 2022-01-16 DIAGNOSIS — E119 Type 2 diabetes mellitus without complications: Secondary | ICD-10-CM

## 2022-01-16 MED ORDER — TRIAMCINOLONE ACETONIDE 40 MG/ML IJ SUSP
80.0000 mg | Freq: Once | INTRAMUSCULAR | Status: AC
  Administered 2022-01-16: 80 mg via INTRAMUSCULAR

## 2022-01-16 NOTE — Assessment & Plan Note (Signed)
Repeat right first MTP injection, last done April 2023.

## 2022-01-16 NOTE — Progress Notes (Signed)
    Procedures performed today:    Procedure: Real-time Ultrasound Guided injection of the right first [facility] Device: Samsung HS60  Verbal informed consent obtained.  Time-out conducted.  Noted no overlying erythema, induration, or other signs of local infection.  Skin prepped in a sterile fashion.  Local anesthesia: Topical Ethyl chloride.  With sterile technique and under real time ultrasound guidance: Arthritic joint noted, 1/2 cc lidocaine, 1/2 cc kenalog 40 injected easily. Completed without difficulty  Advised to call if fevers/chills, erythema, induration, drainage, or persistent bleeding.  Images permanently stored and available for review in PACS.  Impression: Technically successful ultrasound guided injection.   Procedure: Real-time Ultrasound Guided injection of the right first metatarsophalangeal joint Device: Samsung HS60  Verbal informed consent obtained.  Time-out conducted.  Noted no overlying erythema, induration, or other signs of local infection.  Skin prepped in a sterile fashion.  Local anesthesia: Topical Ethyl chloride.  With sterile technique and under real time ultrasound guidance: Arthritic joint noted, 1/2 cc lidocaine, 1/2 cc kenalog 40 injected easily. Completed without difficulty  Advised to call if fevers/chills, erythema, induration, drainage, or persistent bleeding.  Images permanently stored and available for review in PACS.  Impression: Technically successful ultrasound guided injection.  Independent interpretation of notes and tests performed by another provider:   None.  Brief History, Exam, Impression, and Recommendations:    Primary osteoarthritis of right foot Repeat right first MTP injection, last done April 2023.  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  Controlled type 2 diabetes mellitus without complication, without long-term  current use of insulin (HCC) A1c has unfortunately worsened, doubling metformin, recheck in 3 months.    ____________________________________________ [name]. Benjamin Stain, M.D., ABFM., CAQSM., AME. Primary Care and Sports Medicine [facility]  Adjunct Professor of Family Medicine  [employer]  Restaurant manager, fast food

## 2022-01-16 NOTE — Assessment & Plan Note (Signed)
Repeat right first CMC injection, last in April 2023, return as needed.

## 2022-01-17 MED ORDER — METFORMIN HCL ER 500 MG PO TB24: 500.0000 mg | ORAL_TABLET | Freq: Two times a day (BID) | ORAL | 3 refills | Status: DC

## 2022-01-17 NOTE — Addendum Note (Signed)
Addended by: Monica Becton on: 01/17/2022 09:17 AM   Modules accepted: Orders

## 2022-01-17 NOTE — Assessment & Plan Note (Signed)
A1c has unfortunately worsened, doubling metformin, recheck in 3 months.

## 2022-01-18 LAB — PSA, TOTAL AND FREE
PSA, % Free: 45 % (calc) (ref >25)
PSA, Free: 0.5 ng/mL
PSA, Total: 1.1 ng/mL (ref <=4.0)

## 2022-01-18 LAB — COMPLETE METABOLIC PANEL WITH GFR
AG Ratio: 1.9 (calc) (ref 1.0–2.5)
ALT: 30 U/L (ref 9–46)
AST: 26 U/L (ref 10–35)
Albumin: 4.4 g/dL (ref 3.6–5.1)
Alkaline phosphatase (APISO): 69 U/L (ref 35–144)
BUN: 17 mg/dL (ref 7–25)
CO2: 26 mmol/L (ref 20–32)
Calcium: 9.4 mg/dL (ref 8.6–10.3)
Chloride: 104 mmol/L (ref 98–110)
Creat: 0.9 mg/dL (ref 0.70–1.35)
Globulin: 2.3 g/dL (calc) (ref 1.9–3.7)
Glucose, Bld: 160 mg/dL — ABNORMAL HIGH (ref 65–99)
Potassium: 4.5 mmol/L (ref 3.5–5.3)
Sodium: 140 mmol/L (ref 135–146)
Total Bilirubin: 0.4 mg/dL (ref 0.2–1.2)
Total Protein: 6.7 g/dL (ref 6.1–8.1)
eGFR: 95 mL/min/{1.73_m2} (ref >=60)

## 2022-01-18 LAB — MICROALBUMIN / CREATININE URINE RATIO
Creatinine, Urine: 67 mg/dL (ref 20–320)
Microalb Creat Ratio: 6 mcg/mg creat (ref <30)
Microalb, Ur: 0.4 mg/dL

## 2022-01-18 LAB — LIPID PANEL
Cholesterol: 208 mg/dL — ABNORMAL HIGH (ref <200)
HDL: 47 mg/dL (ref >=40)
LDL Cholesterol (Calc): 127 mg/dL (calc) — ABNORMAL HIGH
Non-HDL Cholesterol (Calc): 161 mg/dL (calc) — ABNORMAL HIGH (ref <130)
Total CHOL/HDL Ratio: 4.4 (calc) (ref <5.0)
Triglycerides: 200 mg/dL — ABNORMAL HIGH (ref <150)

## 2022-01-18 LAB — CBC
HCT: 41.3 % (ref 38.5–50.0)
Hemoglobin: 14.5 g/dL (ref 13.2–17.1)
MCH: 34 pg — ABNORMAL HIGH (ref 27.0–33.0)
MCHC: 35.1 g/dL (ref 32.0–36.0)
MCV: 96.7 fL (ref 80.0–100.0)
MPV: 10.7 fL (ref 7.5–12.5)
Platelets: 312 10*3/uL (ref 140–400)
RBC: 4.27 10*6/uL (ref 4.20–5.80)
RDW: 13 % (ref 11.0–15.0)
WBC: 4.5 10*3/uL (ref 3.8–10.8)

## 2022-01-18 LAB — TSH: TSH: 1.94 mIU/L (ref 0.40–4.50)

## 2022-01-18 LAB — HEMOGLOBIN A1C
Hgb A1c MFr Bld: 7.4 % of total Hgb — ABNORMAL HIGH (ref <5.7)
Mean Plasma Glucose: 166 mg/dL
eAG (mmol/L): 9.2 mmol/L

## 2022-01-19 ENCOUNTER — Other Ambulatory Visit: Payer: Self-pay | Admitting: Sports Medicine

## 2022-01-19 DIAGNOSIS — E119 Type 2 diabetes mellitus without complications: Secondary | ICD-10-CM

## 2022-02-21 ENCOUNTER — Ambulatory Visit
Admission: EM | Admit: 2022-02-21 | Discharge: 2022-02-21 | Disposition: A | Discharge status: Home / Self Care | Payer: Medicare Other | Attending: Family Medicine | Admitting: Family Medicine

## 2022-02-21 ENCOUNTER — Encounter: Payer: Self-pay | Admitting: Emergency Medicine

## 2022-02-21 DIAGNOSIS — N390 Urinary tract infection, site not specified: Secondary | ICD-10-CM | POA: Insufficient documentation

## 2022-02-21 DIAGNOSIS — R300 Dysuria: Secondary | ICD-10-CM | POA: Diagnosis not present

## 2022-02-21 LAB — POCT URINALYSIS DIP (MANUAL ENTRY)
Bilirubin, UA: NEGATIVE
Glucose, UA: NEGATIVE mg/dL
Leukocytes, UA: NEGATIVE
Nitrite, UA: NEGATIVE
Spec Grav, UA: >=1.03 — AB (ref 1.010–1.025)
Urobilinogen, UA: 0.2 E.U./dL
pH, UA: 5.5 (ref 5.0–8.0)

## 2022-02-21 MED ORDER — SULFAMETHOXAZOLE-TRIMETHOPRIM 800-160 MG PO TABS: 1.0000 | ORAL_TABLET | Freq: Two times a day (BID) | ORAL | 0 refills | Status: AC

## 2022-02-21 NOTE — ED Provider Notes (Signed)
[facility]    CSN: [id number] Arrival date & time: 02/21/22  1841      History   Chief Complaint Chief Complaint  Patient presents with   Dysuria    HPI Joseph Jacobs is a 67 y.o. male.   HPI  Patient complains of dysuria that started.  He denies urinary frequency or difficulty urinating.  No nocturia or history of prostate problems.  He has no fever or chills.  He has no flank pain or abdominal pain.  No history of urinary problems.  No gross hematuria.  Past Medical History:  Diagnosis Date   Anginal pain (HCC)    Arthritis    "hands, knees, back, rightt foot" (10/11/2016)   Asthma    Chronic bronchitis (HCC)    "get it most years" (10/11/2016)   Chronic lower back pain    Coronary artery disease    High cholesterol    Lumbar degenerative disc disease 12/14/2013   Migraine    "daily to weekly" (10/11/2016)   Pneumonia    "several times; last time was in 03/2016" (10/11/2016)   Sleep apnea    Stress fracture of foot 12/14/2013   "right"    Patient Active Problem List   Diagnosis Date Noted   Seborrheic keratosis 10/04/2021   Shortness of breath 11/29/2020   Family history of prostate cancer 12/30/2019   Impingement syndrome, shoulder, left 10/01/2018   Tick bite 08/06/2018   Cervical spondylosis with radiculopathy 07/09/2018   Chronic pain disorder 07/09/2018   Controlled type 2 diabetes mellitus without complication, without long-term current use of insulin (HCC) 01/16/2018   Benign essential hypertension 01/14/2018   Sleep apnea    CAD in native artery    Unstable angina (HCC)    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, right 12/31/2016   Bilateral patellofemoral syndrome 12/19/2016   Palpitations 11/02/2016   COPD mixed type (HCC) 10/16/2016   Diastolic heart failure of unknown etiology (HCC) 10/11/2016   Coronary artery calcification 09/29/2016   Dyslipidemia 09/29/2016   Former smoker 09/29/2016   Presence  of stent in coronary artery in patient with coronary artery disease 09/26/2016   Annual physical exam 09/03/2016   Primary osteoarthritis of right hip 05/30/2015   Memory loss 12/13/2014   Osteoarthritis of right glenohumeral joint 01/25/2014   Migraine 12/14/2013   Lumbar spondylosis 12/14/2013   Primary osteoarthritis of right foot 12/14/2013   Seasonal allergies 12/14/2013   Stress fracture of foot 12/14/2013   DDD (degenerative disc disease), cervical 08/11/2008    Past Surgical History:  Procedure Laterality Date   BILATERAL CARPAL TUNNEL RELEASE Bilateral    CORONARY ANGIOPLASTY WITH STENT PLACEMENT  10/11/2016   CORONARY STENT INTERVENTION N/A 10/11/2016   Procedure: CORONARY STENT INTERVENTION;  Surgeon: Runell Gess, MD;  [facility]: [facility];  Service: Cardiovascular;  Laterality: N/A;  prox RCA   KNEE ARTHROSCOPY Left 1978   KNEE CARTILAGE SURGERY Left    LEFT HEART CATH AND CORONARY ANGIOGRAPHY N/A 10/11/2016   Procedure: LEFT HEART CATH AND CORONARY ANGIOGRAPHY;  Surgeon: Runell Gess, MD;  Location: [facility];  Service: Cardiovascular;  Laterality: N/A;       Home Medications    Prior to Admission medications   Medication Sig Start Date End Date Taking? Authorizing Provider  sulfamethoxazole-trimethoprim (BACTRIM DS) 800-160 MG tablet Take 1 tablet by mouth 2 (two) times daily for 7 days. 02/21/22 02/28/22 Yes Eustace Moore, MD  acetaminophen (  TYLENOL) 325 MG tablet Take 650 mg by mouth every 6 (six) hours as needed.    [provider]  albuterol (PROVENTIL) (2.5 MG/3ML) 0.083% nebulizer solution Take 3 mLs (2.5 mg total) by nebulization every 6 (six) hours as needed for wheezing or shortness of breath. 11/11/20   Silverio Decamp, MD  albuterol (VENTOLIN HFA) 108 (90 Base) MCG/ACT inhaler INHALE TWO PUFFS BY MOUTH EVERY 6 HOURS AS NEEDED FOR WHEEZING OR SHORTNESS OF BREATH 11/01/21   Silverio Decamp, MD  AMBULATORY  NON FORMULARY MEDICATION Single glucometer with lancets, test strips 03/04/18   Silverio Decamp, MD  aspirin EC 81 MG tablet Take 1 tablet (81 mg total) by mouth daily. Patient taking differently: Take 81 mg by mouth every evening. 07/06/15   Hommel, Hilliard Clark, DO  atorvastatin (LIPITOR) 80 MG tablet Take 1 tablet (80 mg total) by mouth at bedtime. 11/01/21   Silverio Decamp, MD  cyclobenzaprine (FLEXERIL) 10 MG tablet One half tab PO qHS, then increase gradually to one tab TID. Patient not taking: Reported on 02/21/2022 06/13/18   Silverio Decamp, MD  diclofenac (VOLTAREN) 75 MG EC tablet Take 1 tablet (75 mg total) by mouth 2 (two) times daily. 11/01/21   Silverio Decamp, MD  Diclofenac Sodium 3 % GEL APPLY ONE APPLICATION TOPICALLY IN THE MORNING AND AT BEDTIME 11/01/21   Silverio Decamp, MD  glucose blood [facility] VERIO) test strip Use as instructed, testing TID 02/07/21   Silverio Decamp, MD  metFORMIN (GLUCOPHAGE-XR) 500 MG 24 hr tablet Take 1 tablet (500 mg total) by mouth 2 (two) times daily with a meal. 01/17/22   Silverio Decamp, MD  metoprolol succinate (TOPROL-XL) 25 MG 24 hr tablet Take 1 tablet (25 mg total) by mouth daily. 11/01/21   Silverio Decamp, MD  montelukast (SINGULAIR) 10 MG tablet Take 1 tablet (10 mg total) by mouth at bedtime. 11/06/21   Silverio Decamp, MD  nitroGLYCERIN (NITROSTAT) 0.4 MG SL tablet DISSOLVE 1 TAB UNDER TONGUE FOR CHEST PAIN - IF PAIN REMAINS AFTER 5 MIN, CALL 911 AND REPEAT DOSE. MAX 3 TABS IN 15 MINUTES 11/01/21   Silverio Decamp, MD  SUMAtriptan [facility]) 100 MG tablet May repeat in 2 hours if headache persists or recurs. 11/01/21   Silverio Decamp, MD  traMADol (ULTRAM) 50 MG tablet Take 1-2 tablets (50-100 mg total) by mouth every 8 (eight) hours as needed for moderate pain. Maximum 6 tabs per day. 11/01/21   Silverio Decamp, MD  traMADol (ULTRAM-ER) 100 MG 24 hr tablet Take 1 tablet (100  mg total) by mouth daily. 05/17/21   Silverio Decamp, MD    Family History Family History  Problem Relation Age of Onset   Brain cancer Father    Alcoholism Mother    Diabetes Mother        father    Lung cancer Mother    Cancer Other    Cancer - Prostate Brother     Social History Social History   Tobacco Use   Smoking status: Former    Packs/day: 0.33    Years: 44.00    Total pack years: 14.52    Types: Cigarettes    Quit date: 09/29/2016    Years since quitting: 5.4   Smokeless tobacco: Never  Vaping Use   Vaping Use: Never used  Substance Use Topics   Alcohol use: Yes    Alcohol/week: 6.0 standard drinks of alcohol  Types: 3 Glasses of wine, 3 Standard drinks or equivalent per week   Drug use: No     Allergies   Azithromycin, Ciprofloxacin, and E-mycin [erythromycin]   Review of Systems Review of Systems See HPI  Physical Exam Triage Vital Signs ED Triage Vitals  Enc Vitals Group     BP 02/21/22 1851 136/86     Pulse Rate 02/21/22 1851 70     Resp 02/21/22 1851 16     Temp 02/21/22 1851 99 F (37.2 C)     Temp Source 02/21/22 1851 Oral     SpO2 02/21/22 1851 98 %     Weight 02/21/22 1853 189 lb (85.7 kg)     Height 02/21/22 1853 5\' 11"  (1.803 m)     Head Circumference --      Peak Flow --      Pain Score 02/21/22 1852 4     Pain Loc --      Pain Edu? --      Excl. in [place]? --    No data found.  Updated Vital Signs BP 136/86 (BP Location: Left Arm)   Pulse 70   Temp 99 F (37.2 C) (Oral)   Resp 16   Ht 5\' 11"  (1.803 m)   Wt 85.7 kg   SpO2 98%   BMI 26.36 kg/m      Physical Exam Constitutional:      General: He is not in acute distress.    Appearance: He is well-developed.  HENT:     Head: Normocephalic and atraumatic.  Eyes:     Conjunctiva/sclera: Conjunctivae normal.     Pupils: Pupils are equal, round, and reactive to light.  Cardiovascular:     Rate and Rhythm: Normal rate.  Pulmonary:     Effort: Pulmonary effort  is normal. No respiratory distress.  Abdominal:     General: There is no distension.     Palpations: Abdomen is soft.     Tenderness: There is no right CVA tenderness or left CVA tenderness.  Musculoskeletal:        General: Normal range of motion.     Cervical back: Normal range of motion.  Skin:    General: Skin is warm and dry.  Neurological:     Mental Status: He is alert.      UC Treatments / Results  Labs (all labs ordered are listed, but only abnormal results are displayed) Labs Reviewed  POCT URINALYSIS DIP (MANUAL ENTRY) - Abnormal; Notable for the following components:      Result Value   Clarity, UA cloudy (*)    Ketones, POC UA trace (5) (*)    Spec Grav, UA >=1.030 (*)    Blood, UA large (*)    Protein Ur, POC trace (*)    All other components within normal limits  URINE CULTURE    EKG   Radiology No results found.  Procedures Procedures (including critical care time)  Medications Ordered in UC Medications - No data to display  Initial Impression / Assessment and Plan / UC Course  I have reviewed the triage vital signs and the nursing notes.  Pertinent labs & imaging results that were available during my care of the patient were reviewed by me and considered in my medical decision making (see chart for details).     Final Clinical Impressions(s) / UC Diagnoses   Final diagnoses:  Dysuria  Lower urinary tract infectious disease     Discharge Instructions  Make sure you are drinking lots of water Take the antibiotic 2 x a day for one week Your urine will be sent for culture Call for problems   ED Prescriptions     Medication Sig Dispense Auth. Provider   sulfamethoxazole-trimethoprim (BACTRIM DS) 800-160 MG tablet Take 1 tablet by mouth 2 (two) times daily for 7 days. 14 tablet Eustace Moore, MD      PDMP not reviewed this encounter.   Eustace Moore, MD 02/21/22 Jerene Bears

## 2022-02-21 NOTE — Discharge Instructions (Addendum)
Make sure you are drinking lots of water Take the antibiotic 2 x a day for one week Your urine will be sent for culture Call for problems

## 2022-02-21 NOTE — ED Triage Notes (Signed)
Dysuria since last night when voiding

## 2022-02-22 ENCOUNTER — Telehealth: Payer: Self-pay | Admitting: *Deleted

## 2022-02-22 NOTE — Telephone Encounter (Signed)
Left message on patient's personal voicemail, following up from visit. Call back as needed.

## 2022-02-23 LAB — URINE CULTURE
Culture: NO GROWTH
Special Requests: NORMAL

## 2022-02-28 ENCOUNTER — Telehealth (INDEPENDENT_AMBULATORY_CARE_PROVIDER_SITE_OTHER): Payer: Medicare Other | Admitting: Sports Medicine

## 2022-02-28 DIAGNOSIS — R300 Dysuria: Secondary | ICD-10-CM

## 2022-02-28 MED ORDER — CIPROFLOXACIN HCL 500 MG PO TABS: 500.0000 mg | ORAL_TABLET | Freq: Two times a day (BID) | ORAL | 0 refills | Status: DC

## 2022-02-28 MED ORDER — TAMSULOSIN HCL 0.4 MG PO CAPS: 0.4000 mg | ORAL_CAPSULE | Freq: Every day | ORAL | 3 refills | Status: DC

## 2022-02-28 NOTE — Assessment & Plan Note (Signed)
This is a pleasant 67 year old male, he had a few weeks of burning the tip of his penis when voiding. Occasional hesitancy, was seen in urgent care, urinalysis showed blood but no leukocytes or nitrites. Treated with Septra. Urine cultures ultimately were negative. He continues to have symptoms, burning at the tip of the penis, no penile discharge, no visible blood. Prostatitis, irritation of the urethra from hematuria, urinary tract structural abnormality are all in the differential. We will add Cipro for 2 to 4 weeks, he can continue Azo as needed, Flomax to help his hesitancy. If persistent symptoms after a month I would certainly still like to see him back for repeat urinalysis and potential urinary tract imaging.

## 2022-02-28 NOTE — Progress Notes (Signed)
   Virtual Visit via WebEx/MyChart   I connected with  Joseph Jacobs  on 02/28/22 via WebEx/MyChart/Doximity Video and verified that I am speaking with the correct person using two identifiers.   I discussed the limitations, risks, security and privacy concerns of performing an evaluation and management service by WebEx/MyChart/Doximity Video, including the higher likelihood of inaccurate diagnosis and treatment, and the availability of in person appointments.  We also discussed the likely need of an additional face to face encounter for complete and high quality delivery of care.  I also discussed with the patient that there may be a patient responsible charge related to this service. The patient expressed understanding and wishes to proceed.  Provider location is in medical facility. Patient location is at their home, different from provider location. People involved in care of the patient during this telehealth encounter were myself, my nurse/medical assistant, and my front office/scheduling team member.  Review of Systems: No fevers, chills, night sweats, weight loss, chest pain, or shortness of breath.   Objective Findings:    General: Speaking full sentences, no audible heavy breathing.  Sounds alert and appropriately interactive.  Appears well.  Face symmetric.  Extraocular movements intact.  Pupils equal and round.  No nasal flaring or accessory muscle use visualized.  Independent interpretation of tests performed by another provider:   None.  Brief History, Exam, Impression, and Recommendations:    Dysuria This is a pleasant 67 year old male, he had a few weeks of burning the tip of his penis when voiding. Occasional hesitancy, was seen in urgent care, urinalysis showed blood but no leukocytes or nitrites. Treated with Septra. Urine cultures ultimately were negative. He continues to have symptoms, burning at the tip of the penis, no penile discharge, no visible  blood. Prostatitis, irritation of the urethra from hematuria, urinary tract structural abnormality are all in the differential. We will add Cipro for 2 to 4 weeks, he can continue Azo as needed, Flomax to help his hesitancy. If persistent symptoms after a month I would certainly still like to see him back for repeat urinalysis and potential urinary tract imaging.   I discussed the above assessment and treatment plan with the patient. The patient was provided an opportunity to ask questions and all were answered. The patient agreed with the plan and demonstrated an understanding of the instructions.   The patient was advised to call back or seek an in-person evaluation if the symptoms worsen or if the condition fails to improve as anticipated.   I provided 30 minutes of face to face and non-face-to-face time during this encounter date, time was needed to gather information, review chart, records, communicate/coordinate with staff remotely, as well as complete documentation.   ____________________________________________ Gwen Her. Dianah Field, M.D., ABFM., CAQSM., AME. Primary Care and Sports Medicine [place] [facility]  Adjunct Professor of [place] of [facility] of Medicine  Risk manager

## 2022-03-01 ENCOUNTER — Encounter: Payer: Self-pay | Admitting: Sports Medicine

## 2022-03-02 ENCOUNTER — Ambulatory Visit (INDEPENDENT_AMBULATORY_CARE_PROVIDER_SITE_OTHER): Payer: Medicare Other

## 2022-03-02 ENCOUNTER — Ambulatory Visit (INDEPENDENT_AMBULATORY_CARE_PROVIDER_SITE_OTHER): Payer: Medicare Other | Admitting: Sports Medicine

## 2022-03-02 ENCOUNTER — Encounter: Payer: Self-pay | Admitting: Sports Medicine

## 2022-03-02 VITALS — BP 145/87 | HR 86 | Ht 71.0 in | Wt 200.0 lb

## 2022-03-02 DIAGNOSIS — R300 Dysuria: Secondary | ICD-10-CM

## 2022-03-02 DIAGNOSIS — R3129 Other microscopic hematuria: Secondary | ICD-10-CM

## 2022-03-02 DIAGNOSIS — N200 Calculus of kidney: Secondary | ICD-10-CM | POA: Diagnosis not present

## 2022-03-02 LAB — POCT URINALYSIS DIP (CLINITEK)
Bilirubin, UA: NEGATIVE
Glucose, UA: NEGATIVE mg/dL
Ketones, POC UA: NEGATIVE mg/dL
Leukocytes, UA: NEGATIVE
Nitrite, UA: NEGATIVE
POC PROTEIN,UA: NEGATIVE
Spec Grav, UA: 1.015 (ref 1.010–1.025)
Urobilinogen, UA: 0.2 E.U./dL
pH, UA: 6 (ref 5.0–8.0)

## 2022-03-02 MED ORDER — SULFAMETHOXAZOLE-TRIMETHOPRIM 800-160 MG PO TABS: 1.0000 | ORAL_TABLET | Freq: Two times a day (BID) | ORAL | 0 refills | Status: DC

## 2022-03-02 NOTE — Assessment & Plan Note (Signed)
Pleasant 67 year old male, dysuria, was initially on Septra which seem to help, he discontinued this, he gets burning when peeing, hesitancy, we suspected prostatitis and added Cipro, he read the side effects and did not want to take Cipro, we will switch him back to sulfamethoxazole and trimethoprim for 14 days, adding a renal ultrasound to rule out urinary tract abnormalities, we can revisit this in 2 weeks.

## 2022-03-02 NOTE — Progress Notes (Signed)
    Procedures performed today:    None.  Independent interpretation of notes and tests performed by another provider:   None.  Brief History, Exam, Impression, and Recommendations:    Dysuria Pleasant 67 year old male, dysuria, was initially on Septra which seem to help, he discontinued this, he gets burning when peeing, hesitancy, we suspected prostatitis and added Cipro, he read the side effects and did not want to take Cipro, we will switch him back to sulfamethoxazole and trimethoprim for 14 days, adding a renal ultrasound to rule out urinary tract abnormalities, we can revisit this in 2 weeks.    ____________________________________________ Gwen Her. Dianah Field, M.D., ABFM., CAQSM., AME. Primary Care and Sports Medicine [place] [facility]  Adjunct Professor of [place] of [facility] of Medicine  Risk manager

## 2022-03-03 LAB — URINE CULTURE
MICRO NUMBER:: 14511534
Result:: NO GROWTH
SPECIMEN QUALITY:: ADEQUATE

## 2022-03-26 ENCOUNTER — Other Ambulatory Visit: Payer: Self-pay

## 2022-04-02 ENCOUNTER — Telehealth: Payer: Self-pay | Admitting: Sports Medicine

## 2022-04-02 NOTE — Telephone Encounter (Signed)
Called patient to schedule Medicare Annual Wellness Visit (AWV). Left message for patient to call back and schedule Medicare Annual Wellness Visit (AWV).  Last date of AWV: 10/02/22  Please schedule an appointment at any time with Nurse Health Advisor.  If any questions, please contact me at [phone].  Thank you ,  Lin Givens Patient Access Advocate II Direct Dial: [phone]

## 2022-04-11 ENCOUNTER — Encounter: Payer: Self-pay | Admitting: Sports Medicine

## 2022-04-11 ENCOUNTER — Telehealth: Payer: Self-pay | Admitting: Sports Medicine

## 2022-04-11 MED ORDER — SUMATRIPTAN SUCCINATE 100 MG PO TABS: 100.0000 mg | ORAL_TABLET | Freq: Once | ORAL | 3 refills | Status: DC | PRN

## 2022-04-11 NOTE — Telephone Encounter (Signed)
Pt called requesting a refill of SUMAtriptan (IMITREX) 100 MG tablet [id number] .

## 2022-04-13 MED ORDER — SUMATRIPTAN SUCCINATE 100 MG PO TABS: 100.0000 mg | ORAL_TABLET | Freq: Once | ORAL | 3 refills | Status: DC | PRN

## 2022-04-13 NOTE — Telephone Encounter (Signed)
Sent to [facility]

## 2022-05-22 ENCOUNTER — Other Ambulatory Visit: Payer: Self-pay

## 2022-05-22 ENCOUNTER — Ambulatory Visit (INDEPENDENT_AMBULATORY_CARE_PROVIDER_SITE_OTHER): Payer: Medicare Other | Admitting: Sports Medicine

## 2022-05-22 ENCOUNTER — Other Ambulatory Visit (INDEPENDENT_AMBULATORY_CARE_PROVIDER_SITE_OTHER): Payer: Medicare Other

## 2022-05-22 DIAGNOSIS — M1811 Unilateral primary osteoarthritis of first carpometacarpal joint, right hand: Secondary | ICD-10-CM

## 2022-05-22 DIAGNOSIS — M19071 Primary osteoarthritis, right ankle and foot: Secondary | ICD-10-CM

## 2022-05-22 DIAGNOSIS — M5030 Other cervical disc degeneration, unspecified cervical region: Secondary | ICD-10-CM | POA: Diagnosis not present

## 2022-05-22 DIAGNOSIS — M1712 Unilateral primary osteoarthritis, left knee: Secondary | ICD-10-CM | POA: Diagnosis not present

## 2022-05-22 DIAGNOSIS — M47816 Spondylosis without myelopathy or radiculopathy, lumbar region: Secondary | ICD-10-CM | POA: Diagnosis not present

## 2022-05-22 MED ORDER — TRAMADOL HCL ER 100 MG PO TB24: 100.0000 mg | ORAL_TABLET | Freq: Every day | ORAL | 2 refills | Status: DC

## 2022-05-22 MED ORDER — CYCLOBENZAPRINE HCL 10 MG PO TABS: ORAL_TABLET | ORAL | 3 refills | Status: AC

## 2022-05-22 MED ORDER — TRAMADOL HCL 50 MG PO TABS: 50.0000 mg | ORAL_TABLET | Freq: Three times a day (TID) | ORAL | 3 refills | Status: AC | PRN

## 2022-05-22 MED ORDER — MELOXICAM 15 MG PO TABS: ORAL_TABLET | ORAL | 3 refills | Status: AC

## 2022-05-22 NOTE — Assessment & Plan Note (Signed)
Right first [facility] and action today, last done December 2023.

## 2022-05-22 NOTE — Progress Notes (Signed)
    Procedures performed today:    Procedure: Real-time Ultrasound Guided injection of the right first metatarsophalangeal joint Device: Samsung HS60  Verbal informed consent obtained.  Time-out conducted.  Noted no overlying erythema, induration, or other signs of local infection.  Skin prepped in a sterile fashion.  Local anesthesia: Topical Ethyl chloride.  With sterile technique and under real time ultrasound guidance: Arthritic joint noted, 1/2 cc lidocaine, 1/2 cc kenalog 40 injected easily. Completed without difficulty  Advised to call if fevers/chills, erythema, induration, drainage, or persistent bleeding.  Images permanently stored and available for review in PACS.  Impression: Technically successful ultrasound guided injection.  Procedure: Real-time Ultrasound Guided injection of the right first [facility] Device: Samsung HS60  Verbal informed consent obtained.  Time-out conducted.  Noted no overlying erythema, induration, or other signs of local infection.  Skin prepped in a sterile fashion.  Local anesthesia: Topical Ethyl chloride.  With sterile technique and under real time ultrasound guidance: Arthritic joint noted, 1/2 cc lidocaine, 1/2 cc kenalog 40 injected easily. Completed without difficulty  Advised to call if fevers/chills, erythema, induration, drainage, or persistent bleeding.  Images permanently stored and available for review in PACS.  Impression: Technically successful ultrasound guided injection.  Independent interpretation of notes and tests performed by another provider:   None.  Brief History, Exam, Impression, and Recommendations:    Primary osteoarthritis of right foot Repeat right first MTP injection, last done in December 2023.  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  Lumbar spondylosis Repeat right L3-L4 interlaminar epidural, last done about a year ago  and did well.    ____________________________________________ [name]. Benjamin Stain, M.D., ABFM., CAQSM., AME. Primary Care and Sports Medicine [place] [facility]  Adjunct Professor of Family Medicine  [place] of [facility] of Medicine  Restaurant manager, fast food

## 2022-05-22 NOTE — Assessment & Plan Note (Signed)
Repeat right L3-L4 interlaminar epidural, last done about a year ago and did well.

## 2022-05-22 NOTE — Assessment & Plan Note (Signed)
Repeat right first MTP injection, last done in December 2023.

## 2022-06-06 ENCOUNTER — Inpatient Hospital Stay: Admission: RE | Admit: 2022-06-06 | Payer: Medicare Other | Source: Ambulatory Visit

## 2022-06-19 ENCOUNTER — Encounter: Payer: Self-pay | Admitting: Sports Medicine

## 2022-06-19 ENCOUNTER — Ambulatory Visit (INDEPENDENT_AMBULATORY_CARE_PROVIDER_SITE_OTHER): Payer: Medicare Other | Admitting: Sports Medicine

## 2022-06-19 VITALS — BP 151/82 | HR 96

## 2022-06-19 DIAGNOSIS — J340 Abscess, furuncle and carbuncle of nose: Secondary | ICD-10-CM

## 2022-06-19 MED ORDER — MUPIROCIN 2 % EX OINT: TOPICAL_OINTMENT | CUTANEOUS | 3 refills | Status: AC

## 2022-06-19 MED ORDER — DOXYCYCLINE HYCLATE 100 MG PO TABS: 100.0000 mg | ORAL_TABLET | Freq: Two times a day (BID) | ORAL | 0 refills | Status: AC

## 2022-06-19 NOTE — Assessment & Plan Note (Signed)
Pain inside the nares, some fullness on the outside of the nose itself. Felt like an ingrown hair. He took some Advil and use some Neosporin and symptoms improved. His exam is for the most part normal but he still has some tenderness, we will switch to nasal mupirocin and doxycycline, return to see me as needed.

## 2022-06-19 NOTE — Progress Notes (Signed)
    Procedures performed today:    None.  Independent interpretation of notes and tests performed by another provider:   None.  Brief History, Exam, Impression, and Recommendations:    Cellulitis of nasal tip Pain inside the nares, some fullness on the outside of the nose itself. Felt like an ingrown hair. He took some Advil and use some Neosporin and symptoms improved. His exam is for the most part normal but he still has some tenderness, we will switch to nasal mupirocin and doxycycline, return to see me as needed.    ____________________________________________ [name]. Benjamin Stain, M.D., ABFM., CAQSM., AME. Primary Care and Sports Medicine  [facility]  Adjunct Professor of Family Medicine  [place] of [facility] of Medicine  Restaurant manager, fast food

## 2022-07-22 ENCOUNTER — Other Ambulatory Visit: Payer: Self-pay | Admitting: Sports Medicine

## 2022-07-22 DIAGNOSIS — G43809 Other migraine, not intractable, without status migrainosus: Secondary | ICD-10-CM

## 2022-07-22 DIAGNOSIS — R079 Chest pain, unspecified: Secondary | ICD-10-CM

## 2022-07-22 DIAGNOSIS — I2510 Atherosclerotic heart disease of native coronary artery without angina pectoris: Secondary | ICD-10-CM

## 2022-07-22 DIAGNOSIS — R072 Precordial pain: Secondary | ICD-10-CM

## 2022-07-23 ENCOUNTER — Other Ambulatory Visit: Payer: Self-pay | Admitting: Sports Medicine

## 2022-07-30 ENCOUNTER — Ambulatory Visit
Admission: RE | Admit: 2022-07-30 | Discharge: 2022-07-30 | Disposition: A | Discharge status: Home / Self Care | Payer: Medicare Other | Source: Ambulatory Visit | Attending: Sports Medicine | Admitting: Sports Medicine

## 2022-07-30 DIAGNOSIS — M47816 Spondylosis without myelopathy or radiculopathy, lumbar region: Secondary | ICD-10-CM

## 2022-07-30 MED ORDER — METHYLPREDNISOLONE ACETATE 40 MG/ML INJ SUSP (RADIOLOG
80.0000 mg | Freq: Once | INTRAMUSCULAR | Status: AC
  Administered 2022-07-30: 80 mg via EPIDURAL

## 2022-07-30 MED ORDER — IOPAMIDOL (ISOVUE-M 200) INJECTION 41%
1.0000 mL | Freq: Once | INTRAMUSCULAR | Status: AC
  Administered 2022-07-30: 1 mL via EPIDURAL

## 2022-07-30 NOTE — Discharge Instructions (Signed)

## 2022-08-10 ENCOUNTER — Encounter: Payer: Self-pay | Admitting: Medical-Surgical

## 2022-08-10 ENCOUNTER — Ambulatory Visit (INDEPENDENT_AMBULATORY_CARE_PROVIDER_SITE_OTHER): Payer: Medicare Other | Admitting: Medical-Surgical

## 2022-08-10 VITALS — BP 141/83 | HR 59 | Ht 71.0 in | Wt 191.0 lb

## 2022-08-10 DIAGNOSIS — L03032 Cellulitis of left toe: Secondary | ICD-10-CM

## 2022-08-10 MED ORDER — DOXYCYCLINE HYCLATE 100 MG PO TABS: 100.0000 mg | ORAL_TABLET | Freq: Two times a day (BID) | ORAL | 0 refills | Status: AC

## 2022-08-10 NOTE — Progress Notes (Signed)
        Established patient visit  History, exam, impression, and plan:  1. Cellulitis of toe of left foot Pleasant 67 year old male presenting today with reports of 1.5 weeks of left great toe pain after he jammed it.  Had initial pain but this was tolerable and did not interfere with walking.  Unfortunately part of his great toenail tore on the impact.  He ended up getting his sock caught on it and it ripped part of the toenail off.  Several days later, he noted that the area became very sore, red, warm, and very tender to touch.  Has had some intermittent drainage and bleeding from the medial edge of the toenail.  Using Neosporin topically and keeping it covered with a Band-Aid.  Did try mupirocin ointment on it but that did not seem to do much.  See clinical photo below.  Treating with doxycycline 100 mg twice daily for 7 days.  Reviewed recommendations for site care and monitoring.  If no improvement after completing antibiotics, return for further evaluation.    Procedures performed this visit: None.  Return if symptoms worsen or fail to improve.  __________________________________ Thayer Ohm, DNP, APRN, FNP-BC Primary Care and Sports Medicine [facility]

## 2022-08-22 ENCOUNTER — Telehealth: Payer: Self-pay | Admitting: Family Medicine

## 2022-09-07 NOTE — Telephone Encounter (Signed)
LMOM

## 2022-09-28 ENCOUNTER — Other Ambulatory Visit: Payer: Self-pay

## 2022-09-28 ENCOUNTER — Ambulatory Visit (INDEPENDENT_AMBULATORY_CARE_PROVIDER_SITE_OTHER): Payer: Medicare Other | Admitting: Sports Medicine

## 2022-09-28 ENCOUNTER — Other Ambulatory Visit (INDEPENDENT_AMBULATORY_CARE_PROVIDER_SITE_OTHER): Payer: Medicare Other

## 2022-09-28 DIAGNOSIS — M1811 Unilateral primary osteoarthritis of first carpometacarpal joint, right hand: Secondary | ICD-10-CM | POA: Diagnosis not present

## 2022-09-28 DIAGNOSIS — M19071 Primary osteoarthritis, right ankle and foot: Secondary | ICD-10-CM

## 2022-09-28 NOTE — Assessment & Plan Note (Signed)
Repeat right first MTP injection, last done April 2024.

## 2022-09-28 NOTE — Progress Notes (Signed)
    Procedures performed today:    Procedure: Real-time Ultrasound Guided injection of the right first [facility] Device: Samsung HS60  Verbal informed consent obtained.  Time-out conducted.  Noted no overlying erythema, induration, or other signs of local infection.  Skin prepped in a sterile fashion.  Local anesthesia: Topical Ethyl chloride.  With sterile technique and under real time ultrasound guidance: Arthritic joint noted, 1/2 cc lidocaine, 1/2 cc kenalog 40 injected easily. Completed without difficulty  Advised to call if fevers/chills, erythema, induration, drainage, or persistent bleeding.  Images permanently stored and available for review in PACS.  Impression: Technically successful ultrasound guided injection.   Procedure: Real-time Ultrasound Guided injection of the right first metatarsophalangeal joint Device: Samsung HS60  Verbal informed consent obtained.  Time-out conducted.  Noted no overlying erythema, induration, or other signs of local infection.  Skin prepped in a sterile fashion.  Local anesthesia: Topical Ethyl chloride.  With sterile technique and under real time ultrasound guidance: Arthritic joint noted, 1/2 cc lidocaine, 1/2 cc kenalog 40 injected easily. Completed without difficulty  Advised to call if fevers/chills, erythema, induration, drainage, or persistent bleeding.  Images permanently stored and available for review in PACS.  Impression: Technically successful ultrasound guided injection  Independent interpretation of notes and tests performed by another provider:   None.  Brief History, Exam, Impression, and Recommendations:    Primary osteoarthritis of right foot Repeat right first MTP injection, last done April 2024.  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    ____________________________________________ [name]. Benjamin Stain, M.D., ABFM., CAQSM.,  AME. Primary Care and Sports Medicine [place] [facility]  Adjunct Professor of Family Medicine  [place] [employer] [facility] of Medicine  Restaurant manager, fast food

## 2022-09-28 NOTE — Assessment & Plan Note (Signed)
Repeat right first [facility] injection, last in April 2024.

## 2022-10-16 ENCOUNTER — Other Ambulatory Visit: Payer: Self-pay | Admitting: Sports Medicine

## 2022-10-17 ENCOUNTER — Telehealth: Payer: Self-pay | Admitting: Family Medicine

## 2022-10-17 NOTE — Telephone Encounter (Signed)
Call for Appointment (Please schedule diabetic follow-up, eye exam due, pneumonia vaccine due,etc)

## 2022-10-24 NOTE — Telephone Encounter (Signed)
Can we try to call him again. Thank you!!!!

## 2022-12-03 LAB — HM DIABETES EYE EXAM

## 2022-12-17 ENCOUNTER — Telehealth: Payer: Self-pay | Admitting: Sports Medicine

## 2022-12-17 ENCOUNTER — Other Ambulatory Visit: Payer: Self-pay | Admitting: Sports Medicine

## 2022-12-17 DIAGNOSIS — R079 Chest pain, unspecified: Secondary | ICD-10-CM

## 2022-12-17 NOTE — Telephone Encounter (Signed)
Joseph Jacobs will be due for some preventative measures, lets go ahead and have him schedule for some routine blood work and to get caught up on everything.

## 2022-12-18 ENCOUNTER — Telehealth: Payer: Self-pay | Admitting: Sports Medicine

## 2022-12-18 DIAGNOSIS — M19071 Primary osteoarthritis, right ankle and foot: Secondary | ICD-10-CM

## 2022-12-18 NOTE — Telephone Encounter (Signed)
Patient is scheduled for December 2nd he is requesting an x-ray of his right foot prior to his appointment please advise

## 2022-12-18 NOTE — Telephone Encounter (Signed)
X-ray ordered, the appointment will be mostly to catch him up on screenings for his medical problems.

## 2022-12-20 NOTE — Telephone Encounter (Signed)
Left message advising patient to call back and schedule an appointment.

## 2022-12-24 ENCOUNTER — Other Ambulatory Visit: Payer: Self-pay | Admitting: Sports Medicine

## 2022-12-24 DIAGNOSIS — E119 Type 2 diabetes mellitus without complications: Secondary | ICD-10-CM

## 2022-12-31 ENCOUNTER — Ambulatory Visit: Payer: Medicare Other

## 2022-12-31 ENCOUNTER — Ambulatory Visit (INDEPENDENT_AMBULATORY_CARE_PROVIDER_SITE_OTHER): Payer: Medicare Other | Admitting: Sports Medicine

## 2022-12-31 ENCOUNTER — Other Ambulatory Visit: Payer: Self-pay

## 2022-12-31 ENCOUNTER — Other Ambulatory Visit (INDEPENDENT_AMBULATORY_CARE_PROVIDER_SITE_OTHER): Payer: Medicare Other

## 2022-12-31 DIAGNOSIS — M19071 Primary osteoarthritis, right ankle and foot: Secondary | ICD-10-CM | POA: Diagnosis not present

## 2022-12-31 DIAGNOSIS — M79671 Pain in right foot: Secondary | ICD-10-CM

## 2022-12-31 DIAGNOSIS — M1811 Unilateral primary osteoarthritis of first carpometacarpal joint, right hand: Secondary | ICD-10-CM

## 2022-12-31 DIAGNOSIS — E119 Type 2 diabetes mellitus without complications: Secondary | ICD-10-CM | POA: Diagnosis not present

## 2022-12-31 DIAGNOSIS — Z7984 Long term (current) use of oral hypoglycemic drugs: Secondary | ICD-10-CM | POA: Diagnosis not present

## 2022-12-31 DIAGNOSIS — N139 Obstructive and reflux uropathy, unspecified: Secondary | ICD-10-CM | POA: Diagnosis not present

## 2022-12-31 MED ORDER — TRIAMCINOLONE ACETONIDE 40 MG/ML IJ SUSP
80.0000 mg | Freq: Once | INTRAMUSCULAR | Status: AC
  Administered 2022-12-31: 80 mg via INTRAMUSCULAR

## 2022-12-31 NOTE — Assessment & Plan Note (Signed)
Ordering routine labs, last done in December of last year. He did get a diabetic eye exam that was normal about 4 weeks ago.

## 2022-12-31 NOTE — Assessment & Plan Note (Signed)
Known right first carpometacarpal osteoarthritis, last injection August of this year, now with recurrence of pain, repeat right first CMC injection, return as needed for this.

## 2022-12-31 NOTE — Addendum Note (Signed)
Addended by: Carren Rang A on: 12/31/2022 04:54 PM   Modules accepted: Orders

## 2022-12-31 NOTE — Assessment & Plan Note (Signed)
Recurrence of right first MTP pain, also with dorsal midfoot discomfort. Right first MTP injection today, last done in August. Updated x-rays.

## 2022-12-31 NOTE — Progress Notes (Signed)
    Procedures performed today:    None.  Independent interpretation of notes and tests performed by another provider:   Procedure: Real-time Ultrasound Guided injection of the right first [facility] Device: Samsung HS60  Verbal informed consent obtained.  Time-out conducted.  Noted no overlying erythema, induration, or other signs of local infection.  Skin prepped in a sterile fashion.  Local anesthesia: Topical Ethyl chloride.  With sterile technique and under real time ultrasound guidance: Arthritic joint noted, 1/2 cc lidocaine, 1/2 cc kenalog 40 injected easily. Completed without difficulty  Advised to call if fevers/chills, erythema, induration, drainage, or persistent bleeding.  Images permanently stored and available for review in PACS.  Impression: Technically successful ultrasound guided injection.  Procedure: Real-time Ultrasound Guided injection of the right first metatarsophalangeal joint Device: Samsung HS60  Verbal informed consent obtained.  Time-out conducted.  Noted no overlying erythema, induration, or other signs of local infection.  Skin prepped in a sterile fashion.  Local anesthesia: Topical Ethyl chloride.  With sterile technique and under real time ultrasound guidance: Arthritic joint noted, 1/2 cc lidocaine, 1/2 cc kenalog 40 injected easily. Completed without difficulty  Advised to call if fevers/chills, erythema, induration, drainage, or persistent bleeding.  Images permanently stored and available for review in PACS.  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  Primary osteoarthritis of right foot Recurrence of right first MTP pain,  also with dorsal midfoot discomfort. Right first MTP injection today, last done in August. Updated x-rays.   Controlled type 2 diabetes mellitus without complication, without long-term current use of insulin (HCC) Ordering routine labs, last done in December of last year. He did get a diabetic eye exam that was normal about 4 weeks ago.    ____________________________________________ [name]. Benjamin Stain, M.D., ABFM., CAQSM., AME. Primary Care and Sports Medicine [facility]  Adjunct Professor of Family Medicine  [employer]  Restaurant manager, fast food

## 2023-01-01 ENCOUNTER — Other Ambulatory Visit: Payer: Self-pay | Admitting: Sports Medicine

## 2023-01-01 DIAGNOSIS — M47816 Spondylosis without myelopathy or radiculopathy, lumbar region: Secondary | ICD-10-CM

## 2023-01-01 DIAGNOSIS — J449 Chronic obstructive pulmonary disease, unspecified: Secondary | ICD-10-CM

## 2023-01-01 NOTE — Telephone Encounter (Signed)
Task completed. Patient had an appointment with provider on 12/31/22.

## 2023-03-21 ENCOUNTER — Other Ambulatory Visit: Payer: Self-pay | Admitting: Sports Medicine

## 2023-05-15 NOTE — Progress Notes (Signed)
 [facility] Quality Team Note  Name: Joseph Jacobs Date of Birth: [birth year] MRN: [id number] Date: 05/15/2023  [facility] Quality Team has reviewed this patient's chart, please see recommendations below:  [facility] Quality Other; (Patient is due for A1c and KED labs. Patient needs eGFR and URINE Microalbumin/Creatinine ratio completed in 2025 for gap closure.)

## 2023-05-23 NOTE — Progress Notes (Signed)
 Left a message advising patient he is due for fasting labs.

## 2023-05-29 ENCOUNTER — Other Ambulatory Visit: Payer: Self-pay | Admitting: Sports Medicine

## 2023-05-29 DIAGNOSIS — R072 Precordial pain: Secondary | ICD-10-CM

## 2023-05-29 DIAGNOSIS — G43809 Other migraine, not intractable, without status migrainosus: Secondary | ICD-10-CM

## 2023-05-29 DIAGNOSIS — I2510 Atherosclerotic heart disease of native coronary artery without angina pectoris: Secondary | ICD-10-CM

## 2023-06-09 ENCOUNTER — Other Ambulatory Visit: Payer: Self-pay | Admitting: Sports Medicine

## 2023-07-22 ENCOUNTER — Other Ambulatory Visit (INDEPENDENT_AMBULATORY_CARE_PROVIDER_SITE_OTHER)

## 2023-07-22 ENCOUNTER — Ambulatory Visit (INDEPENDENT_AMBULATORY_CARE_PROVIDER_SITE_OTHER): Admitting: Sports Medicine

## 2023-07-22 ENCOUNTER — Other Ambulatory Visit: Payer: Self-pay

## 2023-07-22 DIAGNOSIS — M1811 Unilateral primary osteoarthritis of first carpometacarpal joint, right hand: Secondary | ICD-10-CM | POA: Diagnosis not present

## 2023-07-22 DIAGNOSIS — M19071 Primary osteoarthritis, right ankle and foot: Secondary | ICD-10-CM

## 2023-07-22 DIAGNOSIS — H0014 Chalazion left upper eyelid: Secondary | ICD-10-CM

## 2023-07-22 MED ORDER — TRIAMCINOLONE ACETONIDE 40 MG/ML IJ SUSP
40.0000 mg | Freq: Once | INTRAMUSCULAR | Status: AC
  Administered 2023-07-22: 40 mg via INTRA_ARTICULAR

## 2023-07-22 NOTE — Addendum Note (Signed)
 Addended by: OLEY CHIQUITA CROME on: 07/22/2023 11:03 AM   Modules accepted: Orders

## 2023-07-22 NOTE — Assessment & Plan Note (Signed)
 Recurrence of right first CMC pain, last injection December 2024, repeat right first CMC injection today, return as needed.

## 2023-07-22 NOTE — Progress Notes (Signed)
    Procedures performed today:    Procedure: Real-time Ultrasound Guided injection of the right first [facility] Device: Samsung HS60  Verbal informed consent obtained.  Time-out conducted.  Noted no overlying erythema, induration, or other signs of local infection.  Skin prepped in a sterile fashion.  Local anesthesia: Topical Ethyl chloride.  With sterile technique and under real time ultrasound guidance: Arthritic joint noted, 0.5 cc lidocaine , 0.5 cc kenalog  40 injected easily.   Completed without difficulty  Advised to call if fevers/chills, erythema, induration, drainage, or persistent bleeding.  Images permanently stored and available for review in PACS.  Impression: Technically successful ultrasound guided injection.  Procedure: Real-time Ultrasound Guided injection of the right first MTP Device: Samsung HS60  Verbal informed consent obtained.  Time-out conducted.  Noted no overlying erythema, induration, or other signs of local infection.  Skin prepped in a sterile fashion.  Local anesthesia: Topical Ethyl chloride.  With sterile technique and under real time ultrasound guidance: Arthritic joint noted, 0.5 cc lidocaine , 0.5 cc kenalog  40 injected easily.   Completed without difficulty  Advised to call if fevers/chills, erythema, induration, drainage, or persistent bleeding.  Images permanently stored and available for review in PACS.  Impression: Technically successful ultrasound guided injection.  Independent interpretation of notes and tests performed by another provider:   None.  Brief History, Exam, Impression, and Recommendations:    Primary osteoarthritis of right foot Recurrence right first MTP pain, last injection was December 2024, repeat right first MTP injection today.  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  Chalazion left upper eyelid Left upper eyelid chalazion, no signs infection, he will do massage and warm compresses, return as needed for this.    ____________________________________________ Debby PARAS. Curtis, M.D., ABFM., CAQSM., AME. Primary Care and Sports Medicine [facility]  Adjunct Professor of [facility] [employer] [facility] Medicine  [employer]  Restaurant manager, fast food

## 2023-07-22 NOTE — Assessment & Plan Note (Signed)
 Left upper eyelid chalazion, no signs infection, he will do massage and warm compresses, return as needed for this.

## 2023-07-22 NOTE — Assessment & Plan Note (Signed)
 Recurrence right first MTP pain, last injection was December 2024, repeat right first MTP injection today.

## 2023-08-09 ENCOUNTER — Other Ambulatory Visit: Payer: Self-pay | Admitting: Sports Medicine

## 2023-08-09 DIAGNOSIS — R079 Chest pain, unspecified: Secondary | ICD-10-CM

## 2023-09-13 ENCOUNTER — Other Ambulatory Visit: Payer: Self-pay | Admitting: Sports Medicine

## 2023-09-13 DIAGNOSIS — J449 Chronic obstructive pulmonary disease, unspecified: Secondary | ICD-10-CM

## 2023-09-25 ENCOUNTER — Telehealth: Payer: Self-pay

## 2023-09-25 NOTE — Telephone Encounter (Signed)
 Copied from CRM [id number]. Topic: General - Other >> Sep 25, 2023  4:08 PM Miquel SAILOR wrote: Reason for CRM: Patient called on schedule for Dr. ONEIDA on 10/24 but due to left practice referred to Redell Robes, DO, [facility] since he is new provider and has more available spots [phone].

## 2023-10-01 ENCOUNTER — Encounter: Payer: Self-pay | Admitting: Sports Medicine

## 2023-10-22 ENCOUNTER — Encounter: Payer: Self-pay | Admitting: Sports Medicine

## 2023-10-22 ENCOUNTER — Ambulatory Visit (INDEPENDENT_AMBULATORY_CARE_PROVIDER_SITE_OTHER): Admitting: Sports Medicine

## 2023-10-22 VITALS — BP 130/88 | Ht 71.0 in | Wt 191.0 lb

## 2023-10-22 DIAGNOSIS — M47816 Spondylosis without myelopathy or radiculopathy, lumbar region: Secondary | ICD-10-CM

## 2023-10-22 NOTE — Progress Notes (Signed)
 Patient ID: Joseph Jacobs, male   DOB: 07-Jun-1955, 68 y.o.   MRN: [id number]  Patient is a very pleasant 68 year old patient of Dr.T's that presents today requesting repeat epidural steroid injection.  He has had these injections done in the past with good results.  Last injection was performed in 2023 at the L3-L4 level.  His current symptoms are identical to what he is experienced in the past.  A new order was placed at [facility] for repeat injection.  He will follow-up if pain persists after his injection.  This note was dictated using Dragon naturally speaking software and may contain errors in syntax, spelling, or content which have not been identified prior to signing this note.

## 2023-10-23 DIAGNOSIS — H43812 Vitreous degeneration, left eye: Secondary | ICD-10-CM | POA: Diagnosis not present

## 2023-10-23 DIAGNOSIS — H00014 Hordeolum externum left upper eyelid: Secondary | ICD-10-CM | POA: Diagnosis not present

## 2023-10-23 NOTE — Progress Notes (Signed)
 Curt Oatis                                          MRN: [id number]   10/23/2023   The [facility] Quality Team Specialist reviewed this patient medical record for the purposes of chart review for care gap closure. The following were reviewed: chart review for care gap closure-glycemic status assessment.    [facility] Quality Team

## 2023-10-31 ENCOUNTER — Ambulatory Visit (INDEPENDENT_AMBULATORY_CARE_PROVIDER_SITE_OTHER)

## 2023-10-31 ENCOUNTER — Other Ambulatory Visit (INDEPENDENT_AMBULATORY_CARE_PROVIDER_SITE_OTHER): Payer: Self-pay

## 2023-10-31 VITALS — BP 124/80 | Ht 71.0 in | Wt 186.0 lb

## 2023-10-31 DIAGNOSIS — M1811 Unilateral primary osteoarthritis of first carpometacarpal joint, right hand: Secondary | ICD-10-CM

## 2023-10-31 DIAGNOSIS — M19071 Primary osteoarthritis, right ankle and foot: Secondary | ICD-10-CM | POA: Diagnosis not present

## 2023-10-31 MED ORDER — METHYLPREDNISOLONE ACETATE 40 MG/ML IJ SUSP
40.0000 mg | Freq: Once | INTRAMUSCULAR | Status: AC
  Administered 2023-10-31: 40 mg via INTRA_ARTICULAR

## 2023-10-31 NOTE — Progress Notes (Signed)
 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  Pt reports recurrence of pain in his right thumb and left great toe Endorses receiving injections in these joints at regular intervals with Dr. Curtis in [place]. Last R great toe MTP joint injection done on 01/02/2023 Last R 1st CMC joint injection done on 01/02/2023 No new injuries to these areas Endorses usually around 6 months of relief from these injections No numbness or tingling He is right hand dominant   Review of pertinent imaging: - 3 view R foot plain films from 12/31/22 per my independent review revealing os trigonum. Bipartite tibial sesamoid. Modest degenerative changes at 1st MTP joint. - 3 view R hand plain films from 06/13/17 per my independent review revealing grade III eaton-littler degenerative changes at [facility]. Scattered moderate degenerative changes in distal phalangeal joints. No acute osseous abnormalities.     Objective:   Physical Exam Vitals:   10/31/23 1425  BP: 124/80    R thumb: No overlying skin changes. Bony enlargement noted around [facility] joint. Neg eichoff, Finkelstein's. TTP at [facility]. Nontender at radial styloid. + CMC grind.   R great toe: No overlying skin changes. Bony enlargement of joint noted around 1st MTP joint. Pain with passive ROM of 1st MTP. Remainder of R great toe exam unremarkable.   1st [facility] Joint Injection with Ultrasound Guidance Cleburn Maiolo 1956/01/21 Indications: Pain Procedure Details Following the description of risks including infection bleeding, damage to surrounding structures, patient provided verbal/written consent for Right 1st [facility] Joint injection procedure with ultrasound  guidance. Ultrasound was used to identify the [facility] joint and was used to guide this procedure. Patient was sterilely prepped in the usual fashion with chlorhexidine. Sterile ultrasound gel was used for the procedure. Following topical anesthetization with ethyl chloride, the patient was injected into the joint with a solution of 20mg  Depo-Medrol  and 1cc 2% Mepivicaine. This was well visualized under ultrasound, please see associated photographic documentation. Patient tolerated well without complication. Precautions provided. Cleaned and dressing applied.  Great Toe MTP Joint Injection with Ultrasound Guidance Procedure Note Carroll Lingelbach Jun 10, 1955 Indications: Pain Procedure Details Verbal consent obtained from patient. Risks (including potential risk of bleeding, rare risk of tendon injury), benefits, and alternatives reviewed. The right great toe MTP joint was identified. Prepped with Chloraprep. Ethyl Chloride for anesthesia. Great toe held in traction and plantarflexed 30 degrees. Under sterile conditions, needle inserted perpendicularly and 0.5cc of Mepivicaine 2% and 20 mg Depo-Medrol  injected. No resistance met. No blood on aspiration. Decreased pain post-injection.     Assessment & Plan:   Joseph Jacobs is a 68 y.o. right hand dominant male with significant comorbidities and recurrence of pain in his R thumb and R great toe consistent with his well documented CMC and MTP joint OA. After discussing treatment modalities once again, we elected to proceed with injection of these 2 joints under US  guidance with PRN bracing of thumb and carbon shoe insert to follow. Follow up PRN. Only 40mg  of depo-medrol  administered total was administered during today's visit which I deemed appropriate given his last A1c and current level of debility from these joints.  Lab  Results  Component Value Date   HGBA1C 7.4 (H) 01/16/2022    Greater than 30 minutes of today's encounter were spent in face to face  interaction discussing his prior imaging results, significant past medical history, previous treatments tried, and his previous response to treatments under the jurisdiction of Dr. Curtis.

## 2023-11-07 NOTE — Progress Notes (Signed)
 Cleatus Gabriel                                          MRN: [id number]   11/07/2023   The [facility] Quality Team Specialist reviewed this patient medical record for the purposes of chart review for care gap closure. The following were reviewed: chart review for care gap closure-glycemic status assessment.    [facility] Quality Team

## 2023-11-13 ENCOUNTER — Other Ambulatory Visit: Payer: Self-pay | Admitting: Medical-Surgical

## 2023-11-13 DIAGNOSIS — E119 Type 2 diabetes mellitus without complications: Secondary | ICD-10-CM

## 2023-11-13 MED ORDER — METFORMIN HCL ER 500 MG PO TB24: 500.0000 mg | ORAL_TABLET | Freq: Two times a day (BID) | ORAL | 0 refills | Status: DC

## 2023-11-20 ENCOUNTER — Other Ambulatory Visit: Payer: Self-pay

## 2023-11-20 MED ORDER — MONTELUKAST SODIUM 10 MG PO TABS: 10.0000 mg | ORAL_TABLET | Freq: Every day | ORAL | 0 refills | Status: DC

## 2023-11-20 NOTE — Telephone Encounter (Signed)
 No upcoming appointment

## 2023-11-21 ENCOUNTER — Ambulatory Visit: Admitting: Sports Medicine

## 2023-12-05 ENCOUNTER — Other Ambulatory Visit: Payer: Self-pay

## 2023-12-05 ENCOUNTER — Telehealth: Payer: Self-pay | Admitting: Medical-Surgical

## 2023-12-05 DIAGNOSIS — G43809 Other migraine, not intractable, without status migrainosus: Secondary | ICD-10-CM

## 2023-12-05 NOTE — Telephone Encounter (Signed)
 Would you consider taking this patient toc from Dr. ONEIDA

## 2023-12-07 ENCOUNTER — Other Ambulatory Visit: Payer: Self-pay | Admitting: Medical-Surgical

## 2023-12-07 DIAGNOSIS — E119 Type 2 diabetes mellitus without complications: Secondary | ICD-10-CM

## 2023-12-12 ENCOUNTER — Ambulatory Visit (INDEPENDENT_AMBULATORY_CARE_PROVIDER_SITE_OTHER): Admitting: Medical-Surgical

## 2023-12-12 ENCOUNTER — Encounter: Payer: Self-pay | Admitting: Medical-Surgical

## 2023-12-12 VITALS — BP 132/78 | HR 75 | Resp 20 | Ht 71.0 in | Wt 188.0 lb

## 2023-12-12 DIAGNOSIS — I2510 Atherosclerotic heart disease of native coronary artery without angina pectoris: Secondary | ICD-10-CM

## 2023-12-12 DIAGNOSIS — E1159 Type 2 diabetes mellitus with other circulatory complications: Secondary | ICD-10-CM | POA: Diagnosis not present

## 2023-12-12 DIAGNOSIS — I5030 Unspecified diastolic (congestive) heart failure: Secondary | ICD-10-CM

## 2023-12-12 DIAGNOSIS — Z125 Encounter for screening for malignant neoplasm of prostate: Secondary | ICD-10-CM

## 2023-12-12 DIAGNOSIS — E119 Type 2 diabetes mellitus without complications: Secondary | ICD-10-CM

## 2023-12-12 DIAGNOSIS — I10 Essential (primary) hypertension: Secondary | ICD-10-CM | POA: Diagnosis not present

## 2023-12-12 DIAGNOSIS — Z7984 Long term (current) use of oral hypoglycemic drugs: Secondary | ICD-10-CM

## 2023-12-12 DIAGNOSIS — R072 Precordial pain: Secondary | ICD-10-CM

## 2023-12-12 DIAGNOSIS — J449 Chronic obstructive pulmonary disease, unspecified: Secondary | ICD-10-CM

## 2023-12-12 DIAGNOSIS — G43809 Other migraine, not intractable, without status migrainosus: Secondary | ICD-10-CM | POA: Diagnosis not present

## 2023-12-12 LAB — POCT UA - MICROALBUMIN
Albumin/Creatinine Ratio, Urine, POC: <30
Creatinine, POC: 10 mg/dL
Microalbumin Ur, POC: 10 mg/L

## 2023-12-12 LAB — POCT GLYCOSYLATED HEMOGLOBIN (HGB A1C)
HbA1c, POC (controlled diabetic range): 8.3 % — AB (ref 0.0–7.0)
Hemoglobin A1C: 8.3 % — AB (ref 4.0–5.6)

## 2023-12-12 MED ORDER — SUMATRIPTAN SUCCINATE 100 MG PO TABS: ORAL_TABLET | ORAL | 1 refills | Status: DC

## 2023-12-12 MED ORDER — METOPROLOL SUCCINATE ER 25 MG PO TB24: 25.0000 mg | ORAL_TABLET | Freq: Every day | ORAL | 3 refills | Status: AC

## 2023-12-12 MED ORDER — MONTELUKAST SODIUM 10 MG PO TABS: 10.0000 mg | ORAL_TABLET | Freq: Every day | ORAL | 0 refills | Status: DC

## 2023-12-12 MED ORDER — METFORMIN HCL ER 500 MG PO TB24: 500.0000 mg | ORAL_TABLET | Freq: Two times a day (BID) | ORAL | 3 refills | Status: AC

## 2023-12-12 NOTE — Progress Notes (Signed)
 Established patient visit   History of Present Illness   Discussed the use of AI scribe software for clinical note transcription with the patient, who gave verbal consent to proceed.  History of Present Illness   Joseph Jacobs is a 68 year old male who presents for transfer of care and medication management.  Type 2 diabetes - Diabetes managed with metformin  500mg  once daily - Last A1c was 7.4% - Home blood glucose levels around 106 mg/dL - No current symptoms related to hyperglycemia or hypoglycemia  Cardiovascular disease - Coronary artery disease with prior myocardial infarction in 2018 - Treated with coronary stent placement - Under ongoing cardiology care - Taking Toprol  XL 25mg  daily  Migraine headaches - Migraines occur infrequently - Triggers include barometric pressure changes - Sumatriptan  effective when taken early - Naproxen  used in combination for severe episodes  Allergic rhinitis and asthma - Allergies to grass and animals controlled with nightly montelukast  - Occasional use of inhaler - Former smoker  Musculoskeletal pain - Prefers to avoid pain medications - Uses tramadol  or hydrocodone  sparingly - Finds relief with ice and topical diclofenac  gel  Functional status - Maintains an active lifestyle - Travels frequently to assist elderly in-laws     Physical Exam   Physical Exam Vitals and nursing note reviewed.  Constitutional:      General: He is not in acute distress.    Appearance: Normal appearance. He is not ill-appearing.  HENT:     Head: Normocephalic and atraumatic.  Cardiovascular:     Rate and Rhythm: Normal rate and regular rhythm.     Pulses: Normal pulses.     Heart sounds: Normal heart sounds. No murmur heard.    No friction rub. No gallop.  Pulmonary:     Effort: Pulmonary effort is normal. No respiratory distress.     Breath sounds: Normal breath sounds.  Skin:    General: Skin is warm and dry.   Neurological:     Mental Status: He is alert and oriented to person, place, and time.  Psychiatric:        Mood and Affect: Mood normal.        Behavior: Behavior normal.        Thought Content: Thought content normal.        Judgment: Judgment normal.    Assessment & Plan     Problem List Items Addressed This Visit       Cardiovascular and Mediastinum   Benign essential hypertension - Primary   Blood pressure at 132/75 is acceptable. - Continue Toprol  XL 25mg  and monitor blood pressure as needed. - Follow up with Cardiology as instructed.       Relevant Medications   metoprolol  succinate (TOPROL -XL) 25 MG 24 hr tablet   Other Relevant Orders   CBC with Differential/Platelet (Completed)   Comprehensive metabolic panel with GFR   Lipid panel (Completed)   Coronary artery calcification   Managed by Cardiology. Currently on atorvastatin  and Toprol  XL, compliant with dosing.  - Rechecking lipids today.  - Continue Atorvastatin  and Toprol  LX as prescribed. - Follow up with Cardiology as instructed.       Relevant Medications   metoprolol  succinate (TOPROL -XL) 25 MG 24 hr tablet   Diastolic heart failure of unknown etiology (HCC)   Managed by Cardiology. Treated with Toprol  XL, atorvastatin , and dietary/lifestyle modifications.  - Follow up with Cardiology as instructed.        Relevant  Medications   metoprolol  succinate (TOPROL -XL) 25 MG 24 hr tablet   Migraine (Chronic)   Intermittent migraines managed with sumatriptan  and naproxen . - Continue current management with sumatriptan  and naproxen  as needed.      Relevant Medications   metoprolol  succinate (TOPROL -XL) 25 MG 24 hr tablet   SUMAtriptan  (IMITREX ) 100 MG tablet   Type 2 diabetes mellitus with other circulatory complications (HCC)   A1c at 8.3 indicates uncontrolled diabetes. Neuropathic symptoms may be due to hyperglycemia. - Increased metformin  to 500 mg twice daily. - Recheck A1c in 3 months.       Relevant Medications   metFORMIN  (GLUCOPHAGE -XR) 500 MG 24 hr tablet   metoprolol  succinate (TOPROL -XL) 25 MG 24 hr tablet   Other Relevant Orders   POCT HgB A1C (Completed)   POCT UA - Microalbumin (Completed)   Comprehensive metabolic panel with GFR   Lipid panel (Completed)   TSH (Completed)     Respiratory   COPD mixed type (HCC)   Condition well managed with no current symptoms. Flares minimal and responsive to albuterol  inhaler/nebulizer and Singulair .  - Continue Singulair  10mg  nightly.  - Continue prn albuterol .       Relevant Medications   montelukast  (SINGULAIR ) 10 MG tablet   Other Visit Diagnoses       Prostate cancer screening       Relevant Orders   PSA Total (Reflex To Free) (Completed)      General Health Maintenance Discussed general health maintenance including vaccinations and lifestyle choices. He prefers actuary. - Continue current health maintenance practices as per personal preference.    Follow up   Return in about 3 months (around 03/13/2024) for DM follow up. __________________________________ Zada FREDRIK Palin, DNP, APRN, FNP-BC Primary Care and Sports Medicine [facility] [place]

## 2023-12-13 ENCOUNTER — Ambulatory Visit: Payer: Self-pay | Admitting: Medical-Surgical

## 2023-12-13 LAB — CBC WITH DIFFERENTIAL/PLATELET
Basophils Absolute: 0 x10E3/uL (ref 0.0–0.2)
Basos: 1 %
EOS (ABSOLUTE): 0.2 x10E3/uL (ref 0.0–0.4)
Eos: 5 %
Hematocrit: 41.5 % (ref 37.5–51.0)
Hemoglobin: 14.4 g/dL (ref 13.0–17.7)
Immature Grans (Abs): 0 x10E3/uL (ref 0.0–0.1)
Immature Granulocytes: 0 %
Lymphocytes Absolute: 1.3 x10E3/uL (ref 0.7–3.1)
Lymphs: 27 %
MCH: 33.4 pg — ABNORMAL HIGH (ref 26.6–33.0)
MCHC: 34.7 g/dL (ref 31.5–35.7)
MCV: 96 fL (ref 79–97)
Monocytes Absolute: 0.5 x10E3/uL (ref 0.1–0.9)
Monocytes: 10 %
Neutrophils Absolute: 2.7 x10E3/uL (ref 1.4–7.0)
Neutrophils: 57 %
Platelets: 308 x10E3/uL (ref 150–450)
RBC: 4.31 x10E6/uL (ref 4.14–5.80)
RDW: 13.4 % (ref 11.6–15.4)
WBC: 4.7 x10E3/uL (ref 3.4–10.8)

## 2023-12-13 LAB — LIPID PANEL
Chol/HDL Ratio: 5.3 ratio — ABNORMAL HIGH (ref 0.0–5.0)
Cholesterol, Total: 184 mg/dL (ref 100–199)
HDL: 35 mg/dL — ABNORMAL LOW (ref >39)
LDL Chol Calc (NIH): 79 mg/dL (ref 0–99)
Triglycerides: 434 mg/dL — ABNORMAL HIGH (ref 0–149)
VLDL Cholesterol Cal: 70 mg/dL — ABNORMAL HIGH (ref 5–40)

## 2023-12-13 LAB — TSH: TSH: 1.13 u[IU]/mL (ref 0.450–4.500)

## 2023-12-13 LAB — PSA TOTAL (REFLEX TO FREE): Prostate Specific Ag, Serum: 1.1 ng/mL (ref 0.0–4.0)

## 2023-12-14 NOTE — Assessment & Plan Note (Signed)
 Intermittent migraines managed with sumatriptan  and naproxen . - Continue current management with sumatriptan  and naproxen  as needed.

## 2023-12-14 NOTE — Assessment & Plan Note (Signed)
 Managed by Cardiology. Currently on atorvastatin  and Toprol  XL, compliant with dosing.  - Rechecking lipids today.  - Continue Atorvastatin  and Toprol  LX as prescribed. - Follow up with Cardiology as instructed.

## 2023-12-14 NOTE — Assessment & Plan Note (Signed)
 Condition well managed with no current symptoms. Flares minimal and responsive to albuterol  inhaler/nebulizer and Singulair .  - Continue Singulair  10mg  nightly.  - Continue prn albuterol .

## 2023-12-14 NOTE — Assessment & Plan Note (Signed)
 A1c at 8.3 indicates uncontrolled diabetes. Neuropathic symptoms may be due to hyperglycemia. - Increased metformin  to 500 mg twice daily. - Recheck A1c in 3 months.

## 2023-12-14 NOTE — Assessment & Plan Note (Signed)
 Managed by Cardiology. Treated with Toprol  XL, atorvastatin , and dietary/lifestyle modifications.  - Follow up with Cardiology as instructed.

## 2023-12-14 NOTE — Assessment & Plan Note (Signed)
 Blood pressure at 132/75 is acceptable. - Continue Toprol  XL 25mg  and monitor blood pressure as needed. - Follow up with Cardiology as instructed.

## 2023-12-17 ENCOUNTER — Telehealth: Payer: Self-pay | Admitting: Pharmacist

## 2023-12-17 NOTE — Progress Notes (Signed)
 Pharmacy Quality Measure Review  This patient is appearing on the insurance-providing list for being at risk of failing the adherence measure for Statin Therapy for Patients with Cardiovascular Disease [facility]) medications this calendar year.   Patient prescribed atorvastatin  80 mg daily, but noted that he has not been taking due to muscle soreness and brain fog. Noted in mychart messages that he was researching different statins.   Contacted patient to offer counseling. Left voicemail, and will send MyChart.   Catie IVAR Centers, PharmD, [facility] Clinical Pharmacist [phone]

## 2023-12-18 ENCOUNTER — Other Ambulatory Visit: Payer: Self-pay | Admitting: Medical-Surgical

## 2023-12-18 MED ORDER — SUMATRIPTAN SUCCINATE 100 MG PO TABS: ORAL_TABLET | ORAL | 1 refills | Status: AC

## 2023-12-18 NOTE — Progress Notes (Signed)
 Patient returned my call. Discussed benefit and side effects. He is interested in starting rosuvastatin  40 mg daily.  Will communicate with PCP. Will collaborate with embedded pharmacist to follow up in ~ 4-6 weeks.   Catie IVAR Centers, PharmD, [facility] Clinical Pharmacist [phone]

## 2023-12-19 LAB — COMPREHENSIVE METABOLIC PANEL WITH GFR
ALT: 30 IU/L (ref 0–44)
AST: 31 IU/L (ref 0–40)
Albumin: 4.5 g/dL (ref 3.9–4.9)
Alkaline Phosphatase: 89 IU/L (ref 47–123)
BUN/Creatinine Ratio: 13 (ref 10–24)
BUN: 13 mg/dL (ref 8–27)
Bilirubin Total: 0.2 mg/dL (ref 0.0–1.2)
CO2: 21 mmol/L (ref 20–29)
Calcium: 9.7 mg/dL (ref 8.6–10.2)
Chloride: 101 mmol/L (ref 96–106)
Creatinine, Ser: 1.02 mg/dL (ref 0.76–1.27)
Globulin, Total: 2 g/dL (ref 1.5–4.5)
Glucose: 157 mg/dL — ABNORMAL HIGH (ref 70–99)
Potassium: 4.8 mmol/L (ref 3.5–5.2)
Sodium: 140 mmol/L (ref 134–144)
Total Protein: 6.5 g/dL (ref 6.0–8.5)
eGFR: 81 mL/min/1.73 (ref >59)

## 2023-12-19 LAB — SPECIMEN STATUS REPORT

## 2023-12-19 MED ORDER — ROSUVASTATIN CALCIUM 40 MG PO TABS: 40.0000 mg | ORAL_TABLET | Freq: Every day | ORAL | 3 refills | Status: AC

## 2023-12-25 NOTE — Progress Notes (Signed)
 Joseph Jacobs                                          MRN: [id number]   12/25/2023   The [facility] Quality Team Specialist reviewed this patient medical record for the purposes of chart review for care gap closure. The following were reviewed: abstraction for care gap closure-kidney health evaluation for diabetes:eGFR  and uACR.    [facility] Quality Team

## 2024-01-01 ENCOUNTER — Ambulatory Visit

## 2024-01-01 ENCOUNTER — Other Ambulatory Visit (INDEPENDENT_AMBULATORY_CARE_PROVIDER_SITE_OTHER): Payer: Self-pay

## 2024-01-01 ENCOUNTER — Telehealth: Payer: Self-pay

## 2024-01-01 VITALS — BP 148/80 | Ht 71.0 in | Wt 188.0 lb

## 2024-01-01 DIAGNOSIS — M1812 Unilateral primary osteoarthritis of first carpometacarpal joint, left hand: Secondary | ICD-10-CM | POA: Diagnosis not present

## 2024-01-01 MED ORDER — METHYLPREDNISOLONE ACETATE 40 MG/ML IJ SUSP
40.0000 mg | Freq: Once | INTRAMUSCULAR | Status: AC
  Administered 2024-01-01: 20 mg via INTRA_ARTICULAR

## 2024-01-01 NOTE — Progress Notes (Signed)
   Subjective:    Patient ID: Joseph Jacobs, male    DOB: 68 y.o., 03-03-1955   MRN: [id number]  Chief Complaint: Left hand pain  Discussed the use of AI scribe software for clinical note transcription with the patient, who gave verbal consent to proceed.  History of Present Illness Joseph Jacobs is a 68 year old male who presents with persistent hand pain and dysfunction.  Left hand pain and dysfunction - Persistent pain localized to the right thumb - Morning stiffness and grinding sensation in the thumb joint - Difficulty gripping objects - Pain exacerbated by functional tasks such as turning door handles and folding clothes - Partial relief from prior steroid injection, but symptoms persist - Right hand dominant - Utilizes splints and braces, including during target shooting  Carpal tunnel syndrome status post surgical release - History of carpal tunnel syndrome treated with surgical release  Chronic spinal disc disease - Chronic back and neck disc problems  Pain management strategies - Avoids painkillers - Manages pain with lifestyle measures - Describes a high pain threshold     Objective:   Vitals:   01/01/24 1337  BP: (!) 148/80   Left hand/Fingers/Wrist ( compared to normal ) -Inspection: no swelling, erythema, ecchymoses. No bony deformity or atrophy of the hypothenar region -Palpation: TTP - DIP, - PIP, - MCP,  - metacarpals, - scaphoid/snuff box, - scaphoid tubercle, + CMC -AROM/PROM: Full flexion, extension, supination, pronation of the wrist. Full flexion and extension of the fingers, with and without isolation of the DIP. -Strength: 5/5 flexion, 5/5 extension (radial), 5/5 pronation, 5/5 supination, 5/5 finger abduction (ulnar), 5/5 Ok sign (median) -sensation: intact on dorsum of hand (radial), 4th/5th digits (ulnar), 1st-3rd digits (median) -Special tests: - Finklestein, - Eichoff, - Tinel at wrist, - Durkin compression test, ++ CMC  grind, - TFCC grind, - fovea sign    Left 1st [facility] Joint Injection with Ultrasound Guidance Joseph Jacobs [birth year] Indications: Pain Procedure Details Following the description of risks including infection bleeding, damage to surrounding structures, patient provided verbal/written consent for left [facility] Joint injection procedure with ultrasound guidance. Ultrasound was used to identify the [facility] joint and was used to guide this procedure. Patient was sterilely prepped in the usual fashion with chlorhexidine. Sterile ultrasound gel was used for the procedure. Following topical anesthetization with ethyl chloride, the patient was injected into the joint with a solution of 20mg  Depo-Medrol  and 1cc 2% Mepivicaine. This was well visualized under ultrasound, please see associated photographic documentation. Patient tolerated well without complication. Precautions provided. Cleaned and dressing applied.     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  After discussing bracing, injections, joint reconstruction, we decided to proceed to joint injection today.  He tolerated this well.  Follow-up as needed at this point.  Can consider referral to hand therapy for thumb spica splint in the future.

## 2024-01-01 NOTE — Progress Notes (Signed)
   01/01/2024  Patient ID: Joseph Jacobs, male   DOB: [birth year], 68 y.o.   MRN: [id number]  Unsuccessful outreach attempt to follow-up with patient to see how he is tolerating rosuvastatin  40mg  daily and to see how home BG is since increasing metformin  XR to 500mg  BID.  I was not abe to reach the patient but did leave a HIPAA compliant voicemail with my direct phone number and am sending a MyChart message.  Channing DELENA Mealing, PharmD, DPLA

## 2024-02-16 ENCOUNTER — Other Ambulatory Visit: Payer: Self-pay | Admitting: Medical-Surgical

## 2024-02-26 LAB — OPHTHALMOLOGY REPORT-SCANNED

## 2024-03-03 ENCOUNTER — Encounter: Payer: Self-pay | Admitting: Medical-Surgical

## 2024-03-03 MED ORDER — ONETOUCH VERIO VI STRP: ORAL_STRIP | 12 refills | Status: DC

## 2024-03-03 NOTE — Telephone Encounter (Signed)
 Joy, shows no PCP in patient chart.? Are you this patient's PCP?  Requesting one touch verio glucometer test strips. ( I had thought that this was discontinued ? Will he need a new glucometer sent ?)  Last written 01/10/223 Last OV 12/12/2023 Upcoming appt = none

## 2024-03-05 ENCOUNTER — Other Ambulatory Visit: Payer: Self-pay | Admitting: Medical-Surgical

## 2024-03-05 ENCOUNTER — Encounter: Payer: Self-pay | Admitting: Medical-Surgical

## 2024-03-05 MED ORDER — LANCET DEVICE MISC: 1.0000 | 0 refills | Status: AC

## 2024-03-05 MED ORDER — BLOOD GLUCOSE TEST VI STRP: 1.0000 | ORAL_STRIP | 11 refills | Status: AC

## 2024-03-05 MED ORDER — BLOOD GLUCOSE MONITORING SUPPL DEVI: 1.0000 | 0 refills | Status: AC

## 2024-03-05 MED ORDER — LANCETS MISC: 1.0000 | 11 refills | Status: AC
